# Patient Record
Sex: Female | Born: 1985 | Race: Black or African American | Hispanic: No | Marital: Single | Smoking: Never smoker
Health system: Southern US, Community
[De-identification: ages and names within clinical notes are randomized; demographics above are authoritative.]

## PROBLEM LIST (undated history)

## (undated) ENCOUNTER — Inpatient Hospital Stay (HOSPITAL_COMMUNITY): Payer: Self-pay

## (undated) DIAGNOSIS — M222X9 Patellofemoral disorders, unspecified knee: Secondary | ICD-10-CM

## (undated) DIAGNOSIS — K649 Unspecified hemorrhoids: Secondary | ICD-10-CM

## (undated) DIAGNOSIS — N3000 Acute cystitis without hematuria: Secondary | ICD-10-CM

## (undated) DIAGNOSIS — Z8619 Personal history of other infectious and parasitic diseases: Secondary | ICD-10-CM

## (undated) DIAGNOSIS — B379 Candidiasis, unspecified: Secondary | ICD-10-CM

## (undated) DIAGNOSIS — N760 Acute vaginitis: Secondary | ICD-10-CM

## (undated) DIAGNOSIS — A599 Trichomoniasis, unspecified: Secondary | ICD-10-CM

## (undated) DIAGNOSIS — R87629 Unspecified abnormal cytological findings in specimens from vagina: Secondary | ICD-10-CM

## (undated) DIAGNOSIS — N841 Polyp of cervix uteri: Secondary | ICD-10-CM

## (undated) DIAGNOSIS — B9689 Other specified bacterial agents as the cause of diseases classified elsewhere: Secondary | ICD-10-CM

## (undated) DIAGNOSIS — K219 Gastro-esophageal reflux disease without esophagitis: Secondary | ICD-10-CM

## (undated) HISTORY — DX: Patellofemoral disorders, unspecified knee: M22.2X9

## (undated) HISTORY — DX: Trichomoniasis, unspecified: A59.9

## (undated) HISTORY — DX: Polyp of cervix uteri: N84.1

## (undated) HISTORY — DX: Candidiasis, unspecified: B37.9

## (undated) HISTORY — DX: Unspecified abnormal cytological findings in specimens from vagina: R87.629

## (undated) HISTORY — DX: Other specified bacterial agents as the cause of diseases classified elsewhere: B96.89

## (undated) HISTORY — PX: NO PAST SURGERIES: SHX2092

## (undated) HISTORY — DX: Unspecified hemorrhoids: K64.9

## (undated) HISTORY — DX: Acute vaginitis: N76.0

## (undated) HISTORY — DX: Acute cystitis without hematuria: N30.00

## (undated) HISTORY — DX: Personal history of other infectious and parasitic diseases: Z86.19

---

## 2008-01-12 ENCOUNTER — Emergency Department (HOSPITAL_COMMUNITY): Admission: EM | Admit: 2008-01-12 | Discharge: 2008-01-12 | Payer: Self-pay | Admitting: Emergency Medicine

## 2008-10-12 ENCOUNTER — Other Ambulatory Visit: Admission: RE | Admit: 2008-10-12 | Discharge: 2008-10-12 | Payer: Self-pay | Admitting: Obstetrics & Gynecology

## 2011-03-29 ENCOUNTER — Emergency Department: Payer: Self-pay | Admitting: Emergency Medicine

## 2014-09-22 DIAGNOSIS — R87629 Unspecified abnormal cytological findings in specimens from vagina: Secondary | ICD-10-CM

## 2014-09-22 HISTORY — DX: Unspecified abnormal cytological findings in specimens from vagina: R87.629

## 2014-10-13 ENCOUNTER — Encounter: Payer: Self-pay | Admitting: *Deleted

## 2014-10-13 DIAGNOSIS — N841 Polyp of cervix uteri: Secondary | ICD-10-CM | POA: Insufficient documentation

## 2014-10-13 DIAGNOSIS — M222X9 Patellofemoral disorders, unspecified knee: Secondary | ICD-10-CM | POA: Insufficient documentation

## 2014-10-13 DIAGNOSIS — K649 Unspecified hemorrhoids: Secondary | ICD-10-CM

## 2014-10-16 ENCOUNTER — Ambulatory Visit (INDEPENDENT_AMBULATORY_CARE_PROVIDER_SITE_OTHER): Payer: BC Managed Care – PPO | Admitting: Obstetrics & Gynecology

## 2014-10-16 ENCOUNTER — Encounter: Payer: Self-pay | Admitting: Obstetrics & Gynecology

## 2014-10-16 VITALS — BP 100/70 | Ht 66.0 in | Wt 154.0 lb

## 2014-10-16 DIAGNOSIS — IMO0002 Reserved for concepts with insufficient information to code with codable children: Secondary | ICD-10-CM

## 2014-10-16 DIAGNOSIS — R87810 Cervical high risk human papillomavirus (HPV) DNA test positive: Secondary | ICD-10-CM

## 2014-10-16 DIAGNOSIS — R87610 Atypical squamous cells of undetermined significance on cytologic smear of cervix (ASC-US): Secondary | ICD-10-CM

## 2014-10-16 NOTE — Progress Notes (Signed)
Patient ID: Gail Fields, female   DOB: 12-Feb-1986, 28 y.o.   MRN: [id number] Pap;ASCUS + HR HPV  Colposcopy Procedure Note  Indications: Pap smear 1 months ago showed: ASCUS with POSITIVE high risk HPV. The prior pap showed ASCUS with POSITIVE high risk HPV.  Prior cervical/vaginal disease: normal exam without visible pathology. Prior cervical treatment: no treatment.  Procedure Details  The risks and benefits of the procedure and Verbal informed consent obtained.  Speculum placed in vagina and excellent visualization of cervix achieved, cervix swabbed x 3 with acetic acid solution.  Findings: Cervix: no visible lesions, koilocytic hpv atypia changes Vaginal inspection: normal without visible lesions. Vulvar colposcopy: vulvar colposcopy not performed.  Specimens: none  Complications: none.  Plan: Follow up Pap in 1year

## 2014-11-06 NOTE — L&D Delivery Note (Signed)
Operative Delivery Note At 12:31 PM a viable female was delivered via Vaginal, Vacuum Investment banker, operational(Extractor).  Presentation: vertex; Position: Right,, Occiput,, Anterior; Station: +2.  Indication for operative vaginal delivery:  Prolonged second stage, recurrent persistent variable decelerations  Risks of vacuum assistance were discussed in detail, including but not limited to, bleeding, infection, damage to maternal tissues, fetal cephalohematoma, inability to effect vaginal delivery of the head or shoulder dystocia that cannot be resolved by established maneuvers and need for emergency cesarean section.  Patient gave verbal consent.  Patient was examined and found to be fully dilated with fetal station of +2. The soft vacuum soft cup was positioned over the sagittal suture 3 cm anterior to posterior fontanelle.  Pressure was then increased to 500 mmHg, and the patient was instructed to push.  Pulling was administered along the pelvic curve during four contractions. There were two popoffs.  The infant was then delivered atraumatically; there was a tight nuchal cord that was reduced.  Neonatology present for delivery and attended to baby after delivery.  APGAR: 1, 9  Placenta status: Intact, Spontaneous.   Cord: 3 vessels with the following complications: Tight nuchal cord x 1.    Anesthesia: Epidural  Episiotomy: None Lacerations: 2nd degree Suture Repair: 3.0 vicryl Est. Blood Loss (mL): 300  Mom to postpartum.  Baby to Couplet care / Skin to Skin.  Gail NewcomerANYANWU,[name], MD 08/19/2015, 1:19 PM

## 2014-12-31 ENCOUNTER — Other Ambulatory Visit: Payer: Self-pay | Admitting: Obstetrics & Gynecology

## 2014-12-31 DIAGNOSIS — O3680X0 Pregnancy with inconclusive fetal viability, not applicable or unspecified: Secondary | ICD-10-CM

## 2015-01-05 ENCOUNTER — Ambulatory Visit (INDEPENDENT_AMBULATORY_CARE_PROVIDER_SITE_OTHER): Payer: 59

## 2015-01-05 DIAGNOSIS — O3680X0 Pregnancy with inconclusive fetal viability, not applicable or unspecified: Secondary | ICD-10-CM | POA: Diagnosis not present

## 2015-01-05 NOTE — Progress Notes (Signed)
U/S-single IUP with +FCA noted, FHR-167 bpm, CRL c/w 8+1wks EDD 08/16/2015, cx appears closed, bilateral adnexa appears WNL

## 2015-01-20 ENCOUNTER — Encounter: Payer: Self-pay | Admitting: Women's Health

## 2015-01-20 ENCOUNTER — Ambulatory Visit (INDEPENDENT_AMBULATORY_CARE_PROVIDER_SITE_OTHER): Payer: 59 | Admitting: Women's Health

## 2015-01-20 VITALS — BP 112/56 | HR 60 | Wt 156.0 lb

## 2015-01-20 DIAGNOSIS — O2340 Unspecified infection of urinary tract in pregnancy, unspecified trimester: Secondary | ICD-10-CM | POA: Insufficient documentation

## 2015-01-20 DIAGNOSIS — Z1389 Encounter for screening for other disorder: Secondary | ICD-10-CM

## 2015-01-20 DIAGNOSIS — O26899 Other specified pregnancy related conditions, unspecified trimester: Secondary | ICD-10-CM

## 2015-01-20 DIAGNOSIS — Z3491 Encounter for supervision of normal pregnancy, unspecified, first trimester: Secondary | ICD-10-CM

## 2015-01-20 DIAGNOSIS — R110 Nausea: Secondary | ICD-10-CM

## 2015-01-20 DIAGNOSIS — Z331 Pregnant state, incidental: Secondary | ICD-10-CM

## 2015-01-20 DIAGNOSIS — Z0283 Encounter for blood-alcohol and blood-drug test: Secondary | ICD-10-CM

## 2015-01-20 DIAGNOSIS — R87619 Unspecified abnormal cytological findings in specimens from cervix uteri: Secondary | ICD-10-CM | POA: Insufficient documentation

## 2015-01-20 DIAGNOSIS — R87810 Cervical high risk human papillomavirus (HPV) DNA test positive: Secondary | ICD-10-CM

## 2015-01-20 DIAGNOSIS — Z3490 Encounter for supervision of normal pregnancy, unspecified, unspecified trimester: Secondary | ICD-10-CM | POA: Insufficient documentation

## 2015-01-20 DIAGNOSIS — O2341 Unspecified infection of urinary tract in pregnancy, first trimester: Secondary | ICD-10-CM

## 2015-01-20 DIAGNOSIS — R87610 Atypical squamous cells of undetermined significance on cytologic smear of cervix (ASC-US): Secondary | ICD-10-CM

## 2015-01-20 DIAGNOSIS — Z369 Encounter for antenatal screening, unspecified: Secondary | ICD-10-CM

## 2015-01-20 LAB — OB RESULTS CONSOLE HIV ANTIBODY (ROUTINE TESTING): HIV: NONREACTIVE

## 2015-01-20 LAB — OB RESULTS CONSOLE ABO/RH: RH Type: POSITIVE

## 2015-01-20 LAB — OB RESULTS CONSOLE RUBELLA ANTIBODY, IGM: Rubella: IMMUNE

## 2015-01-20 MED ORDER — CEPHALEXIN 500 MG PO CAPS: 500.0000 mg | ORAL_CAPSULE | Freq: Four times a day (QID) | ORAL | Status: DC

## 2015-01-20 MED ORDER — PRENATAL 27-0.8 MG PO TABS: 1.0000 | ORAL_TABLET | Freq: Every day | ORAL | Status: DC

## 2015-01-20 NOTE — Progress Notes (Addendum)
  Subjective:  Jocelyn LamerLeslie Dell is a 29 y.o. G2P0010 African American female at 1370w2d by 8.1wk u/s, being seen today for her first obstetrical visit.  Her obstetrical history is significant for tab x 1.  Pregnancy history fully reviewed.  Patient reports nausea- requests meds. Denies vb, cramping, uti s/s, abnormal/malodorous vag d/c, or vulvovaginal itching/irritation.  BP 112/56 mmHg  Pulse 60  Wt 156 lb (70.761 kg)  LMP  (LMP Unknown)  HISTORY: OB History  Gravida Para Term Preterm AB SAB TAB Ectopic Multiple Living  2    1  1        # Outcome Date GA Lbr Len/2nd Weight Sex Delivery Anes PTL Lv  2 Current           1 TAB 2011             Past Medical History  Diagnosis Date  . Hemorrhoid   . BV (bacterial vaginosis)   . Acute cystitis   . Trichimoniasis   . 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 Patellofemoral stress syndrome   . Hx of chlamydia infection   . Yeast infection   . Vaginal Pap smear, abnormal 09/22/14    ASCUS- +HPV   Past Surgical History  Procedure Laterality Date  . No past surgeries     Family History  Problem Relation Age of Onset  . Cancer Maternal Grandmother     breast  . Seizures Paternal Grandmother     Exam   System:     General: Well developed & nourished, no acute distress   Skin: Warm & dry, normal coloration and turgor, no rashes   Neurologic: Alert & oriented, normal mood   Cardiovascular: Regular rate & rhythm   Respiratory: Effort & rate normal, LCTAB, acyanotic   Abdomen: Soft, non tender   Extremities: normal strength, tone  Thin prep pap smear 09/22/14: [facility], normal colpo w/ LHE Dec 2015, needs pap after 09/23/15  FHR: 161 via doppler   Assessment:   Pregnancy: G2P0010 Patient Active Problem List   Diagnosis Date Noted  . Supervision of normal pregnancy 01/20/2015    Priority: High  . Hemorrhoids 10/13/2014  . Polyp at cervical os 10/13/2014  . Patellofemoral stress syndrome 10/13/2014     7170w2d G2P0010 New OB visit Nausea of pregnancy UTI 1st trimester  Plan:  Initial labs drawn Continue prenatal vitamins, refill sent in per request Problem list reviewed and updated Reviewed n/v relief measures and warning s/s to report 2 Diclegis samples given- will let us know if Mcaid goes through Reviewed recommended weight gain based on pre-gravid BMI Encouraged well-balanced diet Genetic Screening discussed Integrated Screen: requested Cystic fibrosis screening discussed requested Ultrasound discussed; fetal survey: requested Follow up in 2 weeks for 1st IT/NT and visit [facility] completed Rx keflex qid x 7d for UTI  Marge DuncansBooker, Shiana Rappleye Randall CNM, [facility]NP-BC 01/20/2015 4:21 PM

## 2015-01-20 NOTE — Patient Instructions (Signed)

## 2015-01-21 ENCOUNTER — Other Ambulatory Visit: Payer: Self-pay | Admitting: *Deleted

## 2015-01-21 ENCOUNTER — Telehealth: Payer: Self-pay | Admitting: *Deleted

## 2015-01-21 MED ORDER — PRENATAL PLUS 27-1 MG PO TABS: 1.0000 | ORAL_TABLET | Freq: Every day | ORAL | Status: DC

## 2015-01-21 NOTE — Telephone Encounter (Signed)
Pt stated that she needed a new prenatal vit called into her pharmacy. I advised the pt that it had already been taken care of. The pt states that she has had some issues with constipation. The pt was advised to push her fluids and try miralax, prunes and prune juice. The pt stated that she has tried all of that and still  Will go 4-5 between BMs. I spoke with Drenda FreezeFran and she advised to try eating a couple fiber one bars a day. I advised the pt of this and she stated that she would try that but she didn't really like those bars. I advised the pt that she just needed to increase her fiber, so try to eat fiber rich foods. Pt verbalized understanding.

## 2015-01-22 LAB — URINE CULTURE

## 2015-01-25 ENCOUNTER — Telehealth: Payer: Self-pay | Admitting: Women's Health

## 2015-01-25 DIAGNOSIS — Z3491 Encounter for supervision of normal pregnancy, unspecified, first trimester: Secondary | ICD-10-CM

## 2015-01-25 LAB — PMP SCREEN PROFILE (10S), URINE
Amphetamine Screen, Ur: NEGATIVE ng/mL
Barbiturate Screen, Ur: NEGATIVE ng/mL
Benzodiazepine Screen, Urine: NEGATIVE ng/mL
CANNABINOIDS UR QL SCN: NEGATIVE ng/mL
COCAINE(METAB.) SCREEN, URINE: NEGATIVE ng/mL
CREATININE(CRT), U: 134.5 mg/dL (ref 20.0–300.0)
Methadone Scn, Ur: NEGATIVE ng/mL
OPIATE SCRN UR: NEGATIVE ng/mL
Oxycodone+Oxymorphone Ur Ql Scn: NEGATIVE ng/mL
PCP SCRN UR: NEGATIVE ng/mL
PROPOXYPHENE SCREEN: NEGATIVE ng/mL
Ph of Urine: 6.4 (ref 4.5–8.9)

## 2015-01-25 LAB — MICROSCOPIC EXAMINATION: CASTS: NONE SEEN /LPF

## 2015-01-25 LAB — GC/CHLAMYDIA PROBE AMP
Chlamydia trachomatis, NAA: NEGATIVE
Neisseria gonorrhoeae by PCR: NEGATIVE

## 2015-01-25 LAB — HIV ANTIBODY (ROUTINE TESTING W REFLEX): HIV Screen 4th Generation wRfx: NONREACTIVE

## 2015-01-25 LAB — CYSTIC FIBROSIS MUTATION 97: GENE DIS ANAL CARRIER INTERP BLD/T-IMP: NOT DETECTED

## 2015-01-25 LAB — CBC
HCT: 36.1 % (ref 34.0–46.6)
Hemoglobin: 12.4 g/dL (ref 11.1–15.9)
MCH: 30.7 pg (ref 26.6–33.0)
MCHC: 34.3 g/dL (ref 31.5–35.7)
MCV: 89 fL (ref 79–97)
PLATELETS: 249 10*3/uL (ref 150–379)
RBC: 4.04 x10E6/uL (ref 3.77–5.28)
RDW: 13.9 % (ref 12.3–15.4)
WBC: 10.6 10*3/uL (ref 3.4–10.8)

## 2015-01-25 LAB — URINALYSIS, ROUTINE W REFLEX MICROSCOPIC
BILIRUBIN UA: NEGATIVE
GLUCOSE, UA: NEGATIVE
Ketones, UA: NEGATIVE
Nitrite, UA: NEGATIVE
PROTEIN UA: NEGATIVE
RBC UA: NEGATIVE
SPEC GRAV UA: 1.022 (ref 1.005–1.030)
UUROB: 1 mg/dL (ref 0.2–1.0)
pH, UA: 6.5 (ref 5.0–7.5)

## 2015-01-25 LAB — ABO/RH: Rh Factor: POSITIVE

## 2015-01-25 LAB — HEPATITIS B SURFACE ANTIGEN: HEP B S AG: NEGATIVE

## 2015-01-25 LAB — RUBELLA SCREEN: RUBELLA: 2.21 {index} (ref >0.99)

## 2015-01-25 LAB — SICKLE CELL SCREEN: Sickle Cell Screen: NEGATIVE

## 2015-01-25 LAB — ANTIBODY SCREEN: ANTIBODY SCREEN: NEGATIVE

## 2015-01-25 LAB — VARICELLA ZOSTER ANTIBODY, IGG: Varicella zoster IgG: 268 index (ref >165)

## 2015-01-25 LAB — RPR: RPR Ser Ql: NONREACTIVE

## 2015-02-02 ENCOUNTER — Ambulatory Visit (INDEPENDENT_AMBULATORY_CARE_PROVIDER_SITE_OTHER): Payer: 59 | Admitting: Women's Health

## 2015-02-02 VITALS — BP 118/60 | HR 71 | Temp 98.2°F | Wt 160.0 lb

## 2015-02-02 DIAGNOSIS — K5900 Constipation, unspecified: Secondary | ICD-10-CM | POA: Diagnosis not present

## 2015-02-02 DIAGNOSIS — Z3A12 12 weeks gestation of pregnancy: Secondary | ICD-10-CM

## 2015-02-02 DIAGNOSIS — Z1389 Encounter for screening for other disorder: Secondary | ICD-10-CM

## 2015-02-02 DIAGNOSIS — J302 Other seasonal allergic rhinitis: Secondary | ICD-10-CM

## 2015-02-02 DIAGNOSIS — Z331 Pregnant state, incidental: Secondary | ICD-10-CM

## 2015-02-02 LAB — POCT URINALYSIS DIPSTICK
Blood, UA: NEGATIVE
Glucose, UA: NEGATIVE
Ketones, UA: NEGATIVE
Leukocytes, UA: NEGATIVE
Nitrite, UA: NEGATIVE
Protein, UA: NEGATIVE

## 2015-02-02 NOTE — Progress Notes (Addendum)
   [facility] Visit  Patient name: Gail Fields MRN [id number]  Date of birth: 05-18-1986  CC & HPI:  Gail Fields is a 29 y.o. G2P0010 African American female at 3928w1d presenting today for work-in for report of bad sore throat that began couple of days ago. Hurts to swallow/eat, has tried chloraseptic spray and gargling w/ warm salt water w/o relief. Some sneezing. Denies itchy/watery eyes, fever/chills, cough, earache, runny nose, congestion. Some constipation.   Pertinent History Reviewed:  Medical & Surgical Hx:   Past Medical History  Diagnosis Date  . Hemorrhoid   . BV (bacterial vaginosis)   . Acute cystitis   . Trichimoniasis   . 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 Patellofemoral stress syndrome   . Hx of chlamydia infection   . Yeast infection   . Vaginal Pap smear, abnormal 09/22/14    ASCUS- +HPV   Past Surgical History  Procedure Laterality Date  . No past surgeries     Medications: Reviewed & Updated - see associated section Social History: Reviewed -  reports that she has never smoked. She has never used smokeless tobacco.  Objective Findings:  Vitals: BP 118/60 mmHg  Pulse 71  Wt 160 lb (72.576 kg)  LMP  (LMP Unknown)  Physical Examination: General appearance - alert, well appearing, and in no distress Mouth - moist mucous membranes, oropharynx slightly erythematous, no exudate or lesions Lymphatics - no palpable lymphadenopathy  No results found for this or any previous visit (from the past 24 hour(s)).   Assessment & Plan:  A:   4428w1d SIUP  Seasonal allergies  Constipation P:  Claritin or Zyrtec as needed   Call if develops any other sx  Discussed & gave printed info on constipation  F/U next week as scheduled for nt/it and visit   Marge DuncansBooker, Kimberly Randall CNM, [facility]NP-BC 02/02/2015 4:04 PM

## 2015-02-02 NOTE — Telephone Encounter (Signed)
Pt states that she has had a sore throat since yesterday and has just gotten worse. Pt states that she has tried gargling with warm salt water and using a spray but no relief. Pt states that it hurts to swallow or eat.

## 2015-02-02 NOTE — Telephone Encounter (Signed)
I spoke with Selena BattenKim and she advised that the pt should be seen. I called and left a message on her voice mail to call our office back.

## 2015-02-02 NOTE — Patient Instructions (Signed)
Claritin or Zyrtec  Constipation  Drink plenty of fluid, preferably water, throughout the day  Eat foods high in fiber such as fruits, vegetables, and grains  Exercise, such as walking, is a good way to keep your bowels regular  Drink warm fluids, especially warm prune juice, or decaf coffee  Eat a 1/2 cup of real oatmeal (not instant), 1/2 cup applesauce, and 1/2-1 cup warm prune juice every day  If needed, you may take Colace (docusate sodium) stool softener once or twice a day to help keep the stool soft. If you are pregnant, wait until you are out of your first trimester (12-14 weeks of pregnancy)  If you still are having problems with constipation, you may take Miralax once daily as needed to help keep your bowels regular.  If you are pregnant, wait until you are out of your first trimester (12-14 weeks of pregnancy)

## 2015-02-03 ENCOUNTER — Telehealth: Payer: Self-pay | Admitting: Women's Health

## 2015-02-03 NOTE — Telephone Encounter (Signed)
Pt informed of Joellyn HaffKim Booker, CNM recommendation.

## 2015-02-03 NOTE — Telephone Encounter (Signed)
Pt states she saw Joellyn HaffKim Booker, CNM yesterday was told to take Claritin or Zyrtec OTC for allergies. Pt states symptoms worse, sneezing more, coughing, drainage, scratchy throat. Is there anything else she can take?

## 2015-02-09 ENCOUNTER — Other Ambulatory Visit: Payer: Self-pay | Admitting: *Deleted

## 2015-02-09 DIAGNOSIS — Z3682 Encounter for antenatal screening for nuchal translucency: Secondary | ICD-10-CM

## 2015-02-10 ENCOUNTER — Ambulatory Visit (INDEPENDENT_AMBULATORY_CARE_PROVIDER_SITE_OTHER): Payer: 59

## 2015-02-10 ENCOUNTER — Encounter: Payer: Self-pay | Admitting: Obstetrics & Gynecology

## 2015-02-10 ENCOUNTER — Ambulatory Visit (INDEPENDENT_AMBULATORY_CARE_PROVIDER_SITE_OTHER): Payer: 59 | Admitting: Obstetrics & Gynecology

## 2015-02-10 VITALS — BP 110/60 | HR 88 | Wt 164.0 lb

## 2015-02-10 DIAGNOSIS — Z369 Encounter for antenatal screening, unspecified: Secondary | ICD-10-CM

## 2015-02-10 DIAGNOSIS — Z3682 Encounter for antenatal screening for nuchal translucency: Secondary | ICD-10-CM

## 2015-02-10 DIAGNOSIS — Z3491 Encounter for supervision of normal pregnancy, unspecified, first trimester: Secondary | ICD-10-CM

## 2015-02-10 DIAGNOSIS — Z36 Encounter for antenatal screening of mother: Secondary | ICD-10-CM | POA: Diagnosis not present

## 2015-02-10 NOTE — Addendum Note (Signed)
Addended by: Federico FlakeNES, PEGGY A on: 02/10/2015 05:06 PM   Modules accepted: Medications

## 2015-02-10 NOTE — Progress Notes (Signed)
G2P0010 4548w2d Estimated Date of Delivery: 08/16/15  There were no vitals taken for this visit.   BP weight and urine results all reviewed and noted.  Please refer to the obstetrical flow sheet for the fundal height and fetal heart rate documentation:  Patient reports good fetal movement, denies any bleeding and no rupture of membranes symptoms or regular contractions. Patient is without complaints. All questions were answered.  Plan:  Continued routine obstetrical care,   Follow up in 4 weeks for OB appointment, 2nd IT

## 2015-02-10 NOTE — Progress Notes (Signed)
US [redacted]W[redacted]D crl c/w dates,normal ovs, pos fht 154bpm,nasal bone present, nt 1.686mm

## 2015-02-11 ENCOUNTER — Other Ambulatory Visit: Payer: 59

## 2015-02-11 ENCOUNTER — Encounter: Payer: 59 | Admitting: Advanced Practice Midwife

## 2015-02-12 LAB — MATERNAL SCREEN, INTEGRATED #1
Crown Rump Length: 67.9 mm
GEST. AGE ON COLLECTION DATE: 12.9 wk
Maternal Age at EDD: 30.3 years
Nuchal Translucency (NT): 1.6 mm
Number of Fetuses: 1
PAPP-A VALUE: 3689.8 ng/mL
WEIGHT: 163 [lb_av]

## 2015-03-10 ENCOUNTER — Encounter: Payer: Self-pay | Admitting: Obstetrics & Gynecology

## 2015-03-10 ENCOUNTER — Ambulatory Visit (INDEPENDENT_AMBULATORY_CARE_PROVIDER_SITE_OTHER): Payer: 59 | Admitting: Obstetrics & Gynecology

## 2015-03-10 VITALS — BP 120/60 | HR 76 | Wt 164.0 lb

## 2015-03-10 DIAGNOSIS — Z369 Encounter for antenatal screening, unspecified: Secondary | ICD-10-CM

## 2015-03-10 DIAGNOSIS — Z3492 Encounter for supervision of normal pregnancy, unspecified, second trimester: Secondary | ICD-10-CM

## 2015-03-10 DIAGNOSIS — Z331 Pregnant state, incidental: Secondary | ICD-10-CM

## 2015-03-10 DIAGNOSIS — Z1389 Encounter for screening for other disorder: Secondary | ICD-10-CM

## 2015-03-10 LAB — POCT URINALYSIS DIPSTICK
GLUCOSE UA: NEGATIVE
Ketones, UA: NEGATIVE
LEUKOCYTES UA: NEGATIVE
NITRITE UA: NEGATIVE
PROTEIN UA: NEGATIVE
RBC UA: NEGATIVE

## 2015-03-10 NOTE — Addendum Note (Signed)
Addended by: Colen DarlingYOUNG, Latandra Loureiro S on: 03/10/2015 04:31 PM   Modules accepted: Orders

## 2015-03-12 LAB — MATERNAL SCREEN, INTEGRATED #2
AFP MARKER: 44.5 ng/mL
AFP MoM: 1.22
Crown Rump Length: 67.9 mm
DIA MOM: 1.95
DIA Value: 329.9 pg/mL
ESTRIOL UNCONJUGATED: 1.07 ng/mL
GEST. AGE ON COLLECTION DATE: 12.9 wk
GESTATIONAL AGE: 16.9 wk
HCG MOM: 1.27
HCG VALUE: 35.1 [IU]/mL
Maternal Age at EDD: 30.3 years
NUCHAL TRANSLUCENCY (NT): 1.6 mm
NUCHAL TRANSLUCENCY MOM: 1.06
Number of Fetuses: 1
PAPP-A MoM: 3.54
PAPP-A Value: 3689.8 ng/mL
Test Results:: NEGATIVE
Weight: 163 [lb_av]
Weight: 164 [lb_av]
uE3 MoM: 1.08

## 2015-04-07 ENCOUNTER — Other Ambulatory Visit: Payer: 59

## 2015-04-07 ENCOUNTER — Encounter: Payer: 59 | Admitting: Women's Health

## 2015-04-14 ENCOUNTER — Other Ambulatory Visit: Payer: Self-pay | Admitting: Obstetrics & Gynecology

## 2015-04-14 ENCOUNTER — Ambulatory Visit (INDEPENDENT_AMBULATORY_CARE_PROVIDER_SITE_OTHER): Payer: 59

## 2015-04-14 ENCOUNTER — Ambulatory Visit (INDEPENDENT_AMBULATORY_CARE_PROVIDER_SITE_OTHER): Payer: 59 | Admitting: Advanced Practice Midwife

## 2015-04-14 VITALS — BP 108/56 | HR 82 | Wt 167.5 lb

## 2015-04-14 DIAGNOSIS — Z1389 Encounter for screening for other disorder: Secondary | ICD-10-CM

## 2015-04-14 DIAGNOSIS — Z331 Pregnant state, incidental: Secondary | ICD-10-CM

## 2015-04-14 DIAGNOSIS — Z3492 Encounter for supervision of normal pregnancy, unspecified, second trimester: Secondary | ICD-10-CM

## 2015-04-14 DIAGNOSIS — N898 Other specified noninflammatory disorders of vagina: Secondary | ICD-10-CM

## 2015-04-14 LAB — POCT URINALYSIS DIPSTICK
Blood, UA: NEGATIVE
Glucose, UA: NEGATIVE
Ketones, UA: NEGATIVE
Leukocytes, UA: NEGATIVE
NITRITE UA: NEGATIVE
Protein, UA: NEGATIVE

## 2015-04-14 MED ORDER — PRAMOXINE HCL 1 % RE FOAM: RECTAL | Status: DC

## 2015-04-14 NOTE — Progress Notes (Signed)
G2P0010 3647w2d Estimated Date of Delivery: 08/16/15  Blood pressure 108/56, pulse 82, weight 167 lb 8 oz (75.978 kg).   BP weight and urine results all reviewed and noted.  Please refer to the obstetrical flow sheet for the fundal height and fetal heart rate documentation: Anatomy scan today:   [place] 22+2wks measurements c/w dates,ant pl gr 0,cephalic,sdp of fluid 6.4cm,normal rt ov,unable to see lt ov, fht 143,efw 549g,anatomy complete w/no obvious abn          Patient reports good fetal movement, denies any bleeding and no rupture of membranes symptoms or regular contractions. Patient C/O hemorrhoids and increased DC.  No odor or irritation.  SSE:  Frothy white dc.  No odor. Wet prep:  Neg clue, trich, yeast.  Many WBC All questions were answered.  Plan:  Continued routine obstetrical care, proctocream BID.  GC/CHL  Follow up in 3 weeks for OB appointment,

## 2015-04-14 NOTE — Progress Notes (Signed)
[place] 22+2wks measurements c/w dates,ant pl gr 0,cephalic,sdp of fluid 6.4cm,normal rt ov,unable to see lt ov, fht 143,efw 549g,anatomy complete w/no obvious abn

## 2015-04-16 LAB — GC/CHLAMYDIA PROBE AMP
CHLAMYDIA, DNA PROBE: NEGATIVE
Neisseria gonorrhoeae by PCR: NEGATIVE

## 2015-05-11 ENCOUNTER — Encounter: Payer: 59 | Admitting: Obstetrics and Gynecology

## 2015-05-19 ENCOUNTER — Encounter: Payer: Self-pay | Admitting: Obstetrics and Gynecology

## 2015-05-19 ENCOUNTER — Ambulatory Visit (INDEPENDENT_AMBULATORY_CARE_PROVIDER_SITE_OTHER): Payer: 59 | Admitting: Obstetrics and Gynecology

## 2015-05-19 ENCOUNTER — Encounter: Payer: 59 | Admitting: Obstetrics and Gynecology

## 2015-05-19 VITALS — BP 90/60 | HR 96 | Wt 175.0 lb

## 2015-05-19 DIAGNOSIS — Z3202 Encounter for pregnancy test, result negative: Secondary | ICD-10-CM

## 2015-05-19 DIAGNOSIS — Z1389 Encounter for screening for other disorder: Secondary | ICD-10-CM

## 2015-05-19 DIAGNOSIS — Z331 Pregnant state, incidental: Secondary | ICD-10-CM

## 2015-05-19 LAB — POCT URINALYSIS DIPSTICK
Blood, UA: NEGATIVE
GLUCOSE UA: NEGATIVE
KETONES UA: NEGATIVE
Leukocytes, UA: NEGATIVE
Nitrite, UA: NEGATIVE
PROTEIN UA: NEGATIVE

## 2015-05-19 NOTE — Progress Notes (Signed)
Patient ID: Gail Fields, female   DOB: 18-Jul-1986, 29 y.o.   MRN: [id number]  G2P0010 5819w2d Estimated Date of Delivery: 08/16/15  Blood pressure 90/60, pulse 96, weight 175 lb (79.379 kg).   refer to the ob flow sheet for FH and FHR, also BP, Wt, Urine results:notable for negative  Patient reports  + good fetal movement, denies any bleeding and no rupture of membranes symptoms or regular contractions. Patient complaints: None. She notes some intermittent bilateral ankle swelling and left knee pain. She states her left knee has "given out" when she has stepped out of her car twice so far, which has not ever happened until her pregnancy.   FHR: 137 bpm FH: 30 cm  Questions were answered. Childbirth classes encouraged.  Assessment: Pregnancy 6819w2d, LROB   Joint pain nonconcerning, .  Plan:  Continued routine obstetrical care,   Tylenol as needed for joint pain.  Knee compression sleeve advised prn  F/u in ~2 weeks for GTT testing, 28 wk labs  F/u in 4 weeks in-office for pnx care,     This chart was SCRIBED for Christin BachJohn Zionah Criswell, MD by Ronney LionSuzanne Le, ED Scribe. This patient was seen in room 1, and the patient's care was started at 12:05 PM.  I personally performed the services described in this documentation, which was SCRIBED in my presence. The recorded information has been reviewed and considered accurate. It has been edited as necessary during review. Tilda BurrowFERGUSON,Genna Casimir V, MD

## 2015-05-19 NOTE — Progress Notes (Signed)
Pt denies any problems or concerns at this time.  

## 2015-06-10 ENCOUNTER — Other Ambulatory Visit: Payer: 59

## 2015-06-10 ENCOUNTER — Encounter: Payer: 59 | Admitting: Advanced Practice Midwife

## 2015-06-14 ENCOUNTER — Other Ambulatory Visit: Payer: 59

## 2015-06-23 ENCOUNTER — Ambulatory Visit (INDEPENDENT_AMBULATORY_CARE_PROVIDER_SITE_OTHER): Payer: Medicaid Other | Admitting: Advanced Practice Midwife

## 2015-06-23 ENCOUNTER — Other Ambulatory Visit: Payer: Medicaid Other

## 2015-06-23 ENCOUNTER — Encounter: Payer: Self-pay | Admitting: Advanced Practice Midwife

## 2015-06-23 VITALS — BP 110/60 | HR 80 | Wt 181.4 lb

## 2015-06-23 DIAGNOSIS — Z331 Pregnant state, incidental: Secondary | ICD-10-CM

## 2015-06-23 DIAGNOSIS — Z3493 Encounter for supervision of normal pregnancy, unspecified, third trimester: Secondary | ICD-10-CM

## 2015-06-23 DIAGNOSIS — Z1389 Encounter for screening for other disorder: Secondary | ICD-10-CM

## 2015-06-23 DIAGNOSIS — N898 Other specified noninflammatory disorders of vagina: Secondary | ICD-10-CM

## 2015-06-23 LAB — OB RESULTS CONSOLE GC/CHLAMYDIA
Chlamydia: NEGATIVE
Gonorrhea: NEGATIVE

## 2015-06-23 LAB — POCT URINALYSIS DIPSTICK
Glucose, UA: NEGATIVE
KETONES UA: NEGATIVE
Leukocytes, UA: NEGATIVE
Nitrite, UA: NEGATIVE
PROTEIN UA: NEGATIVE
RBC UA: NEGATIVE

## 2015-06-23 MED ORDER — DIBUCAINE 1 % EX OINT: TOPICAL_OINTMENT | Freq: Three times a day (TID) | CUTANEOUS | Status: DC | PRN

## 2015-06-23 MED ORDER — HYDROCORTISONE 2.5 % RE CREA: 1.0000 "application " | TOPICAL_CREAM | RECTAL | Status: DC | PRN

## 2015-06-23 NOTE — Progress Notes (Signed)
G2P0010 [redacted]w[redacted]d Estimated Date of Delivery: 08/16/15  Blood pressure 110/60, pulse 80, weight 181 lb 6.4 oz (82.283 kg).   BP weight and urine results all reviewed and noted.  Please refer to the obstetrical flow sheet for the fundal height and fetal heart rate documentation:  Patient reports good fetal movement, denies any bleeding and no rupture of membranes symptoms or regular contractions. Patient c/o occ vag itch this week.  Also wants hemorrhoid cream with "caine" of some sort.  SSE:  Small frothy white dc, no odor.  No clue, TNTC WBC, no trich.  Vagina looks normal.  1cm non thrombosed hemorrhoid. All questions were answered.  Plan:  Continued routine obstetrical care, PN2 today. Check GC/CHL/Trich on urine.  Rx dubucaine ointment. May use hydrocortisone on ext vagina and hemorrhoid prn itch  Follow up in 2 weeks for OB appointment,

## 2015-06-24 LAB — GLUCOSE TOLERANCE, 2 HOURS W/ 1HR
Glucose, 1 hour: 119 mg/dL (ref 65–179)
Glucose, 2 hour: 92 mg/dL (ref 65–152)
Glucose, Fasting: 84 mg/dL (ref 65–91)

## 2015-06-24 LAB — CBC
Hematocrit: 35.9 % (ref 34.0–46.6)
Hemoglobin: 12.1 g/dL (ref 11.1–15.9)
MCH: 30.6 pg (ref 26.6–33.0)
MCHC: 33.7 g/dL (ref 31.5–35.7)
MCV: 91 fL (ref 79–97)
PLATELETS: 168 10*3/uL (ref 150–379)
RBC: 3.96 x10E6/uL (ref 3.77–5.28)
RDW: 12.9 % (ref 12.3–15.4)
WBC: 10.1 10*3/uL (ref 3.4–10.8)

## 2015-06-24 LAB — ANTIBODY SCREEN: ANTIBODY SCREEN: NEGATIVE

## 2015-06-24 LAB — HSV 2 ANTIBODY, IGG: HSV 2 Glycoprotein G Ab, IgG: <0.91 index (ref 0.00–0.90)

## 2015-06-24 LAB — RPR: RPR: NONREACTIVE

## 2015-06-24 LAB — HIV ANTIBODY (ROUTINE TESTING W REFLEX): HIV Screen 4th Generation wRfx: NONREACTIVE

## 2015-06-25 LAB — GC/CHLAMYDIA PROBE AMP
Chlamydia trachomatis, NAA: NEGATIVE
Neisseria gonorrhoeae by PCR: NEGATIVE

## 2015-06-25 LAB — TRICHOMONAS VAGINALIS, PROBE AMP: TRICH VAG BY NAA: NEGATIVE

## 2015-07-09 ENCOUNTER — Encounter: Payer: Medicaid Other | Admitting: Obstetrics and Gynecology

## 2015-07-09 ENCOUNTER — Ambulatory Visit (INDEPENDENT_AMBULATORY_CARE_PROVIDER_SITE_OTHER): Payer: Medicaid Other | Admitting: Obstetrics and Gynecology

## 2015-07-09 ENCOUNTER — Encounter: Payer: Self-pay | Admitting: Obstetrics and Gynecology

## 2015-07-09 VITALS — BP 110/66 | HR 92 | Wt 184.0 lb

## 2015-07-09 DIAGNOSIS — Z3493 Encounter for supervision of normal pregnancy, unspecified, third trimester: Secondary | ICD-10-CM

## 2015-07-09 DIAGNOSIS — Z331 Pregnant state, incidental: Secondary | ICD-10-CM

## 2015-07-09 DIAGNOSIS — Z1389 Encounter for screening for other disorder: Secondary | ICD-10-CM

## 2015-07-09 LAB — POCT URINALYSIS DIPSTICK
Blood, UA: NEGATIVE
GLUCOSE UA: NEGATIVE
Ketones, UA: NEGATIVE
LEUKOCYTES UA: NEGATIVE
NITRITE UA: NEGATIVE
Protein, UA: NEGATIVE

## 2015-07-09 NOTE — Progress Notes (Signed)
Patient ID: Gail Fields, female   DOB: [birth year], 29 y.o.   MRN: [id number]  G2P0010 [redacted]w[redacted]d Estimated Date of Delivery: 08/16/15  Blood pressure 110/66, pulse 92, weight 184 lb (83.462 kg).   refer to the ob flow sheet for FH and FHR, also BP, Wt, Urine results:notable for negative  Patient reports  + good fetal movement, denies any bleeding and no rupture of membranes symptoms or regular contractions. Patient complaints: None.  FHR: 136 bpm FH: 35 cm   Questions were answered. Assessment: Pregnancy [redacted]w[redacted]d, LROB   Weekend childbirth classes strongly encouraged. Plan:  Continued routine obstetrical care,   F/u in 2 weeks for pnx care  ;Classes encouraged    This chart was SCRIBED for Christin Bach, MD by Ronney Lion, ED Scribe. This patient was seen in room 2, and the patient's care was started at 10:15 AM.  I personally performed the services described in this documentation, which was SCRIBED in my presence. The recorded information has been reviewed and considered accurate. It has been edited as necessary during review. Tilda Burrow, MD

## 2015-07-09 NOTE — Progress Notes (Signed)
Pt denies any problems or concerns at this time.  

## 2015-07-21 ENCOUNTER — Encounter: Payer: Self-pay | Admitting: Obstetrics & Gynecology

## 2015-07-21 ENCOUNTER — Encounter: Payer: Medicaid Other | Admitting: Women's Health

## 2015-07-21 ENCOUNTER — Ambulatory Visit (INDEPENDENT_AMBULATORY_CARE_PROVIDER_SITE_OTHER): Payer: Medicaid Other | Admitting: Obstetrics & Gynecology

## 2015-07-21 VITALS — BP 118/70 | HR 72 | Wt 192.0 lb

## 2015-07-21 DIAGNOSIS — Z3493 Encounter for supervision of normal pregnancy, unspecified, third trimester: Secondary | ICD-10-CM

## 2015-07-21 DIAGNOSIS — Z331 Pregnant state, incidental: Secondary | ICD-10-CM

## 2015-07-21 DIAGNOSIS — Z1389 Encounter for screening for other disorder: Secondary | ICD-10-CM

## 2015-07-21 LAB — POCT URINALYSIS DIPSTICK
Blood, UA: NEGATIVE
GLUCOSE UA: NEGATIVE
KETONES UA: NEGATIVE
LEUKOCYTES UA: NEGATIVE
PROTEIN UA: NEGATIVE

## 2015-07-21 NOTE — Progress Notes (Signed)
G2P0010 [redacted]w[redacted]d Estimated Date of Delivery: 08/16/15  Blood pressure 118/70, pulse 72, weight 192 lb (87.091 kg).   BP weight and urine results all reviewed and noted.  Please refer to the obstetrical flow sheet for the fundal height and fetal heart rate documentation:  Patient reports good fetal movement, denies any bleeding and no rupture of membranes symptoms or regular contractions. Patient is without complaints. All questions were answered.  Orders Placed This Encounter  Procedures  . POCT urinalysis dipstick    Plan:  Continued routine obstetrical care,   Return in about 1 week (around 07/28/2015) for LROB.

## 2015-07-30 ENCOUNTER — Ambulatory Visit (INDEPENDENT_AMBULATORY_CARE_PROVIDER_SITE_OTHER): Payer: Medicaid Other | Admitting: Women's Health

## 2015-07-30 ENCOUNTER — Encounter: Payer: Self-pay | Admitting: Women's Health

## 2015-07-30 ENCOUNTER — Encounter: Payer: Medicaid Other | Admitting: Women's Health

## 2015-07-30 VITALS — BP 100/70 | HR 72 | Wt 190.4 lb

## 2015-07-30 DIAGNOSIS — Z1159 Encounter for screening for other viral diseases: Secondary | ICD-10-CM

## 2015-07-30 DIAGNOSIS — Z1389 Encounter for screening for other disorder: Secondary | ICD-10-CM

## 2015-07-30 DIAGNOSIS — Z331 Pregnant state, incidental: Secondary | ICD-10-CM

## 2015-07-30 DIAGNOSIS — Z3493 Encounter for supervision of normal pregnancy, unspecified, third trimester: Secondary | ICD-10-CM

## 2015-07-30 DIAGNOSIS — Z3685 Encounter for antenatal screening for Streptococcus B: Secondary | ICD-10-CM

## 2015-07-30 DIAGNOSIS — Z118 Encounter for screening for other infectious and parasitic diseases: Secondary | ICD-10-CM

## 2015-07-30 LAB — POCT URINALYSIS DIPSTICK
Blood, UA: NEGATIVE
Glucose, UA: NEGATIVE
LEUKOCYTES UA: NEGATIVE
NITRITE UA: NEGATIVE
PROTEIN UA: NEGATIVE

## 2015-07-30 LAB — OB RESULTS CONSOLE GBS: GBS: NEGATIVE

## 2015-07-30 NOTE — Patient Instructions (Addendum)
Call the office [phone]) or go to [facility] if:  You begin to have strong, frequent contractions  Your water breaks.  Sometimes it is a big gush of fluid, sometimes it is just a trickle that keeps getting your panties wet or running down your legs  You have vaginal bleeding.  It is normal to have a small amount of spotting if your cervix was checked.   You don't feel your baby moving like normal.  If you don't, get you something to eat and drink and lay down and focus on feeling your baby move.  You should feel at least 10 movements in 2 hours.  If you don't, you should call the office or go to [facility].    [place] Pediatricians/Family Doctors:  [facility] [phone]            [facility] [phone]                 [facility] [phone] (usually not accepting new patients unless you have family there already, you are always welcome to call and ask)            [facility] ([address]) [phone]   AREA PEDIATRIC/FAMILY PRACTICE PHYSICIANS  [facility] [address] [place], [place] [address] Phone - [phone]   Fax - [phone]  JACK AMOS [address] [place], [place]  [address] Phone - [phone]   Fax - [phone]  [facility] [address] [place], [place]  [address] Phone - [phone]   Fax - [phone]  [facility] [address] [place], [place]  [address] Phone - [phone]   Fax - [phone]  [facility] [address] [place], [place]  [address] Phone - [phone]   Fax - [phone]  [facility] [address] [place], [place]  [address] Phone - [phone]   Fax - [phone]  [facility]  [address] [place], [place]  [address] Phone - [phone]   Fax - [phone]  [facility]  FAMILY MEDICINE AT [facility] [address] [place], [place]  [address] Phone - [phone]   Fax - [phone]  [facility] AT [facility] [address] [place], [place]  [address] Phone - [phone]   Fax - [phone] [facility] [place] [address] [place], [place]  [address] Phone - [phone]   Fax - [phone]  [facility] [address] [place], [place]  [address] Phone - [phone]   Fax - [phone]  [facility] [address], [address] [place], [place]  [address] Phone - [phone]   Fax - [phone]  [facility] [place] [address] [place], [place]  [address] Phone - [phone]   Fax - [phone]  [facility] [address] [place], [place]  [address] Phone - [phone]  [facility] [address] [place], [place]  [address] Phone - [phone]   Fax - [phone]  [facility] [address] [place] [place], [place]  [address] Phone - [phone]   Fax - [phone]  [facility] [address] [place], [place]  [address] Phone - [phone]   Fax - [phone]  [facility] [address] [place], [place]  [address] Phone - [phone]  Fax - [phone]   [facility] [address] [place], [place]  [address] Phone - [phone]   Fax - [phone]  [facility] [address] [place], [place]  [address] Phone - [phone]   Fax - [phone]  DAVID RUBIN [address] [place], [place]  [address] Phone - [phone]   Fax - [phone]  [facility] [address] [place], [place]  [address] Phone - [phone]   Fax - [phone]  [facility] [address] [place], [place]  [address] Phone - [phone]    Fax - [phone] Gerarda Fraction [address] [place], [place]  [address] Phone - [phone]   Fax - [phone]  [facility] [address] [place], [place]  [address] Phone - [phone]   Fax - [phone]  [facility] - [place] [address] [place], [place]  [address] Phone - [phone]   Fax - [phone]      Deberah Pelton Contractions Contractions of the uterus can occur throughout pregnancy. Contractions are not always a sign that you are in labor.  WHAT ARE BRAXTON HICKS CONTRACTIONS?  Contractions that occur before labor are called Braxton Hicks contractions, or false labor. Toward the end of pregnancy (32-34 weeks), these contractions can develop more often and may become more forceful. This is not true labor because these contractions do not result in opening (dilatation) and thinning of the cervix. They are sometimes difficult to tell apart from true labor because these contractions can be forceful and people have different pain tolerances. You should not feel embarrassed if you go to the hospital with false labor. Sometimes, the only way to tell if you are in true labor is for your health care provider to look for changes in the cervix. If there are no prenatal problems or other health problems associated with the pregnancy, it is completely safe to be sent home with false labor and await the onset of true labor. HOW CAN YOU TELL THE DIFFERENCE BETWEEN TRUE AND FALSE LABOR? False Labor 6. The contractions of false labor are usually shorter and not as hard as those of true labor.  7. The contractions are usually irregular.  8. The contractions are often felt in the front of the lower abdomen and in the groin.  9. The contractions may go away when you walk around or change positions while lying down.  10. The contractions get weaker and are shorter lasting as time goes on.  11. The contractions do not  usually become progressively stronger, regular, and closer together as with true labor.  True Labor 2. Contractions in true labor last 30-70 seconds, become very regular, usually become more intense, and increase in frequency.  3. The contractions do not go away with walking.  4. The discomfort is usually felt in the top of the uterus and spreads to the lower abdomen and low back.  5. True labor can be determined by your health care provider with an exam. This will show that the cervix is dilating and getting thinner.  WHAT TO REMEMBER 2. Keep up with your usual exercises and follow other instructions given by your health care provider.  3. Take medicines as directed by your health care provider.  4. Keep your regular prenatal appointments.  5. Eat and drink lightly if you think you are going into labor.  6. If Braxton Hicks contractions are  making you uncomfortable:  1. Change your position from lying down or resting to walking, or from walking to resting.  2. Sit and rest in a tub of warm water.  3. Drink 2-3 glasses of water. Dehydration may cause these contractions.  4. Do slow and deep breathing several times an hour.  WHEN SHOULD I SEEK IMMEDIATE MEDICAL CARE? Seek immediate medical care if: 2. Your contractions become stronger, more regular, and closer together.  3. You have fluid leaking or gushing from your vagina.  4. You have a fever.  5. You pass blood-tinged mucus.  6. You have vaginal bleeding.  7. You have continuous abdominal pain.  8. You have low back pain that you never had before.  9. You feel your baby's head pushing down and causing pelvic pressure.  10. Your baby is not moving as much as it used to.  Document Released: 10/23/2005 Document Revised: 10/28/2013 Document Reviewed: 08/04/2013 [facility] Patient Information 2015 [place], [place]. This information is not intended to replace advice given to you by your health care provider. Make sure you  discuss any questions you have with your health care provider.

## 2015-07-30 NOTE — Progress Notes (Signed)
Low-risk OB appointment G2P0010 [redacted]w[redacted]d Estimated Date of Delivery: 08/16/15 BP 100/70 mmHg  Pulse 72  Wt 190 lb 6.4 oz (86.365 kg)  LMP  (LMP Unknown)  BP, weight, and urine reviewed.  Refer to obstetrical flow sheet for FH & FHR.  Reports good fm.  Denies regular uc's, lof, vb, or uti s/s. No complaints. GBS collected SVE per request: unable to reach cx, very posterior, soft, vtx Reviewed labor s/s, fkc. Plan:  Continue routine obstetrical care  F/U in 1wk for OB appointment

## 2015-08-02 LAB — GC/CHLAMYDIA PROBE AMP
Chlamydia trachomatis, NAA: NEGATIVE
Neisseria gonorrhoeae by PCR: NEGATIVE

## 2015-08-02 LAB — CULTURE, BETA STREP (GROUP B ONLY): STREP GP B CULTURE: POSITIVE — AB

## 2015-08-04 ENCOUNTER — Ambulatory Visit (INDEPENDENT_AMBULATORY_CARE_PROVIDER_SITE_OTHER): Payer: Medicaid Other | Admitting: Obstetrics & Gynecology

## 2015-08-04 VITALS — BP 118/64 | HR 84 | Wt 192.0 lb

## 2015-08-04 DIAGNOSIS — Z3493 Encounter for supervision of normal pregnancy, unspecified, third trimester: Secondary | ICD-10-CM

## 2015-08-04 DIAGNOSIS — Z1389 Encounter for screening for other disorder: Secondary | ICD-10-CM

## 2015-08-04 DIAGNOSIS — Z331 Pregnant state, incidental: Secondary | ICD-10-CM

## 2015-08-04 LAB — POCT URINALYSIS DIPSTICK
Blood, UA: NEGATIVE
GLUCOSE UA: NEGATIVE
Ketones, UA: NEGATIVE
Leukocytes, UA: NEGATIVE
Nitrite, UA: NEGATIVE

## 2015-08-04 NOTE — Progress Notes (Signed)
G2P0010 [redacted]w[redacted]d Estimated Date of Delivery: 08/16/15  Blood pressure 118/64, pulse 84, weight 192 lb (87.091 kg).   BP weight and urine results all reviewed and noted.  Please refer to the obstetrical flow sheet for the fundal height and fetal heart rate documentation:  Patient reports good fetal movement, denies any bleeding and no rupture of membranes symptoms or regular contractions. Patient is without complaints. All questions were answered.  Orders Placed This Encounter  Procedures  . POCT urinalysis dipstick    Plan:  Continued routine obstetrical care,   Return in about 1 week (around 08/11/2015) for LROB.

## 2015-08-11 ENCOUNTER — Encounter: Payer: Medicaid Other | Admitting: Obstetrics & Gynecology

## 2015-08-11 ENCOUNTER — Encounter: Payer: Self-pay | Admitting: Obstetrics and Gynecology

## 2015-08-11 ENCOUNTER — Ambulatory Visit (INDEPENDENT_AMBULATORY_CARE_PROVIDER_SITE_OTHER): Payer: Medicaid Other | Admitting: Obstetrics and Gynecology

## 2015-08-11 VITALS — BP 110/68 | HR 84 | Wt 194.0 lb

## 2015-08-11 DIAGNOSIS — Z1389 Encounter for screening for other disorder: Secondary | ICD-10-CM

## 2015-08-11 DIAGNOSIS — Z3483 Encounter for supervision of other normal pregnancy, third trimester: Secondary | ICD-10-CM

## 2015-08-11 DIAGNOSIS — Z331 Pregnant state, incidental: Secondary | ICD-10-CM

## 2015-08-11 LAB — POCT URINALYSIS DIPSTICK
GLUCOSE UA: NEGATIVE
Ketones, UA: NEGATIVE
Leukocytes, UA: NEGATIVE
NITRITE UA: NEGATIVE
RBC UA: NEGATIVE

## 2015-08-11 NOTE — Progress Notes (Signed)
Patient ID: Gail Fields, female   DOB: 09-12-1986, 29 y.o.   MRN: [id number]   G2P0010 [redacted]w[redacted]d Estimated Date of Delivery: 08/16/15  Blood pressure 110/68, pulse 84, weight 194 lb (87.998 kg).  Pt did NOT attend childbirth classes. refer to the ob flow sheet for FH and FHR, also BP, Wt, Urine results: trace protein  Patient reports  + good fetal movement, denies any bleeding and no rupture of membranes symptoms or regular contractions. Patient has no complaints  FH: 37cm  FHR: 124 Edema: none Cervix:  exam done and cervix is 2cm long -2 vertex  Questions were answered. Assessment: [redacted]w[redacted]d G2P0010 Plan:  Continued routine obstetrical care for supervision of normal pregancy            General guidelines for IOL for post dates reviewed with pt  F/u in 1 weeks for routine obstetrical care  By signing my name below, I, Jarvis Morgan, attest that this documentation has been prepared under the direction and in the presence of Tilda Burrow, MD. Electronically Signed: Jarvis Morgan, ED Scribe. 08/11/2015. 3:36 PM.  I personally performed the services described in this documentation, which was SCRIBED in my presence. The recorded information has been reviewed and considered accurate. It has been edited as necessary during review. Tilda Burrow, MD

## 2015-08-11 NOTE — Progress Notes (Signed)
Pt denies any problems or concerns at this time.  

## 2015-08-12 ENCOUNTER — Encounter: Payer: Medicaid Other | Admitting: Obstetrics & Gynecology

## 2015-08-16 ENCOUNTER — Telehealth: Payer: Self-pay | Admitting: *Deleted

## 2015-08-16 ENCOUNTER — Telehealth: Payer: Self-pay | Admitting: Obstetrics & Gynecology

## 2015-08-16 NOTE — Telephone Encounter (Signed)
Pt requesting to get induced this Thursday. Pt states she was supposed to get induced on Friday. Pt states has a appt this Wednesday with Dr. Emelda Fear. Pt informed will route this message to Dr. Emelda Fear to see if he would be willing to change the induction date.

## 2015-08-16 NOTE — Telephone Encounter (Signed)
Duplicate message. 

## 2015-08-18 ENCOUNTER — Telehealth: Payer: Self-pay | Admitting: Advanced Practice Midwife

## 2015-08-18 ENCOUNTER — Inpatient Hospital Stay (HOSPITAL_COMMUNITY): Payer: Medicaid Other | Admitting: Anesthesiology

## 2015-08-18 ENCOUNTER — Ambulatory Visit (INDEPENDENT_AMBULATORY_CARE_PROVIDER_SITE_OTHER): Payer: Medicaid Other | Admitting: Obstetrics and Gynecology

## 2015-08-18 ENCOUNTER — Inpatient Hospital Stay (HOSPITAL_COMMUNITY)
Admission: AD | Admit: 2015-08-18 | Discharge: 2015-08-21 | DRG: 775 | Disposition: A | Discharge status: Home / Self Care | Payer: Medicaid Other | Source: Ambulatory Visit | Attending: Obstetrics and Gynecology | Admitting: Obstetrics and Gynecology

## 2015-08-18 ENCOUNTER — Encounter (HOSPITAL_COMMUNITY): Payer: Self-pay | Admitting: *Deleted

## 2015-08-18 VITALS — BP 120/70 | HR 78 | Wt 198.0 lb

## 2015-08-18 DIAGNOSIS — Z3A40 40 weeks gestation of pregnancy: Secondary | ICD-10-CM | POA: Diagnosis not present

## 2015-08-18 DIAGNOSIS — O99824 Streptococcus B carrier state complicating childbirth: Secondary | ICD-10-CM | POA: Diagnosis present

## 2015-08-18 DIAGNOSIS — O480 Post-term pregnancy: Secondary | ICD-10-CM | POA: Diagnosis not present

## 2015-08-18 DIAGNOSIS — Z331 Pregnant state, incidental: Secondary | ICD-10-CM

## 2015-08-18 DIAGNOSIS — IMO0001 Reserved for inherently not codable concepts without codable children: Secondary | ICD-10-CM

## 2015-08-18 DIAGNOSIS — Z3493 Encounter for supervision of normal pregnancy, unspecified, third trimester: Secondary | ICD-10-CM

## 2015-08-18 DIAGNOSIS — Z1389 Encounter for screening for other disorder: Secondary | ICD-10-CM

## 2015-08-18 LAB — CBC
HEMATOCRIT: 37.5 % (ref 36.0–46.0)
HEMOGLOBIN: 13.1 g/dL (ref 12.0–15.0)
MCH: 32.2 pg (ref 26.0–34.0)
MCHC: 34.9 g/dL (ref 30.0–36.0)
MCV: 92.1 fL (ref 78.0–100.0)
Platelets: 166 10*3/uL (ref 150–400)
RBC: 4.07 MIL/uL (ref 3.87–5.11)
RDW: 13.5 % (ref 11.5–15.5)
WBC: 12.6 10*3/uL — AB (ref 4.0–10.5)

## 2015-08-18 LAB — ABO/RH: ABO/RH(D): A POS

## 2015-08-18 LAB — TYPE AND SCREEN
ABO/RH(D): A POS
ANTIBODY SCREEN: NEGATIVE

## 2015-08-18 MED ORDER — EPHEDRINE 5 MG/ML INJ: 10.0000 mg | INTRAVENOUS | Status: DC | PRN

## 2015-08-18 MED ORDER — FENTANYL 2.5 MCG/ML BUPIVACAINE 1/10 % EPIDURAL INFUSION (WH - ANES)
14.0000 mL/h | INTRAMUSCULAR | Status: DC | PRN
  Administered 2015-08-18: 14 mL/h via EPIDURAL
  Filled 2015-08-18 (×2): qty 125

## 2015-08-18 MED ORDER — OXYTOCIN BOLUS FROM INFUSION
500.0000 mL | INTRAVENOUS | Status: DC
  Administered 2015-08-19: 500 mL via INTRAVENOUS

## 2015-08-18 MED ORDER — CITRIC ACID-SODIUM CITRATE 334-500 MG/5ML PO SOLN
30.0000 mL | ORAL | Status: DC | PRN
  Administered 2015-08-19: 30 mL via ORAL
  Filled 2015-08-18 (×2): qty 15

## 2015-08-18 MED ORDER — ACETAMINOPHEN 325 MG PO TABS: 650.0000 mg | ORAL_TABLET | ORAL | Status: DC | PRN

## 2015-08-18 MED ORDER — LIDOCAINE HCL (PF) 1 % IJ SOLN
30.0000 mL | INTRAMUSCULAR | Status: AC | PRN
  Administered 2015-08-18 (×2): 8 mL via SUBCUTANEOUS
  Filled 2015-08-18: qty 30

## 2015-08-18 MED ORDER — OXYTOCIN 40 UNITS IN LACTATED RINGERS INFUSION - SIMPLE MED: 62.5000 mL/h | INTRAVENOUS | Status: DC

## 2015-08-18 MED ORDER — LACTATED RINGERS IV SOLN
500.0000 mL | INTRAVENOUS | Status: DC | PRN
  Administered 2015-08-19 (×2): 500 mL via INTRAVENOUS

## 2015-08-18 MED ORDER — PHENYLEPHRINE 40 MCG/ML (10ML) SYRINGE FOR IV PUSH (FOR BLOOD PRESSURE SUPPORT)
80.0000 ug | PREFILLED_SYRINGE | INTRAVENOUS | Status: DC | PRN
  Filled 2015-08-18: qty 20

## 2015-08-18 MED ORDER — FENTANYL CITRATE (PF) 100 MCG/2ML IJ SOLN
100.0000 ug | INTRAMUSCULAR | Status: DC | PRN
  Administered 2015-08-18: 100 ug via INTRAVENOUS
  Filled 2015-08-18: qty 2

## 2015-08-18 MED ORDER — OXYCODONE-ACETAMINOPHEN 5-325 MG PO TABS: 2.0000 | ORAL_TABLET | ORAL | Status: DC | PRN

## 2015-08-18 MED ORDER — AMPICILLIN SODIUM 2 G IJ SOLR
2.0000 g | Freq: Once | INTRAMUSCULAR | Status: AC
  Administered 2015-08-18: 2 g via INTRAVENOUS
  Filled 2015-08-18: qty 2000

## 2015-08-18 MED ORDER — LACTATED RINGERS IV SOLN
INTRAVENOUS | Status: DC
  Administered 2015-08-19: 09:00:00 via INTRAVENOUS

## 2015-08-18 MED ORDER — OXYCODONE-ACETAMINOPHEN 5-325 MG PO TABS
1.0000 | ORAL_TABLET | ORAL | Status: DC | PRN
  Administered 2015-08-19: 1 via ORAL
  Filled 2015-08-18 (×2): qty 1

## 2015-08-18 MED ORDER — DIPHENHYDRAMINE HCL 50 MG/ML IJ SOLN: 12.5000 mg | INTRAMUSCULAR | Status: DC | PRN

## 2015-08-18 MED ORDER — ONDANSETRON HCL 4 MG/2ML IJ SOLN: 4.0000 mg | Freq: Four times a day (QID) | INTRAMUSCULAR | Status: DC | PRN

## 2015-08-18 NOTE — Progress Notes (Signed)
Patient ID: Gail LamerLeslie Fields, female   DOB: 25-Jun-1986, 29 y.o.   MRN: [id number]  G2P0010 3636w2d Estimated Date of Delivery: 08/16/15  Blood pressure 120/70, pulse 78, weight 198 lb (89.812 kg).  Pt did not attend birthing classes refer to the ob flow sheet for FH and FHR, also BP, Wt, Urine results:notable for   Patient reports  + good fetal movement, denies any bleeding and no rupture of membranes symptoms or regular contractions. Patient complaints: No complaints at this time. Her due date was 08/16/15  3 cm 20% soft -2, soft, Vertex FHR: 132 FH: 41 cm Edema: none  Questions were answered. Assessment: 3036w2d G2P0010 Plan:  Continued routine obstetrical care, Will schedule induction date @ 41 weeks   By signing my name below, I, Jarvis Morganaylor Rosaire Cueto, attest that this documentation has been prepared under the direction and in the presence of Tilda BurrowJohn V. Semaj Coburn MD Electronically Signed: Jarvis Morganaylor Rovena Hearld, ED Scribe. 08/18/2015. 11:29 AM.  I personally performed the services described in this documentation, which was SCRIBED in my presence. The recorded information has been reviewed and considered accurate. It has been edited as necessary during review. Tilda BurrowFERGUSON,Diamantina Edinger V, MD

## 2015-08-18 NOTE — Progress Notes (Signed)
Pt denies any problems or concerns at this time.  

## 2015-08-18 NOTE — Anesthesia Preprocedure Evaluation (Signed)
Anesthesia Evaluation  Patient identified by MRN, date of birth, ID band Patient awake    Reviewed: Allergy & Precautions, H&P , NPO status , Patient's Chart, lab work & pertinent test results  Airway Mallampati: I  TM Distance: >3 FB Neck ROM: full    Dental no notable dental hx.    Pulmonary neg pulmonary ROS,    Pulmonary exam normal        Cardiovascular negative cardio ROS Normal cardiovascular exam     Neuro/Psych negative neurological ROS  negative psych ROS   GI/Hepatic negative GI ROS, Neg liver ROS,   Endo/Other  negative endocrine ROS  Renal/GU negative Renal ROS     Musculoskeletal   Abdominal (+) + obese,   Peds  Hematology negative hematology ROS (+)   Anesthesia Other Findings   Reproductive/Obstetrics (+) Pregnancy                             Anesthesia Physical Anesthesia Plan  ASA: II  Anesthesia Plan: Epidural   Post-op Pain Management:    Induction:   Airway Management Planned:   Additional Equipment:   Intra-op Plan:   Post-operative Plan:   Informed Consent: I have reviewed the patients History and Physical, chart, labs and discussed the procedure including the risks, benefits and alternatives for the proposed anesthesia with the patient or authorized representative who has indicated his/her understanding and acceptance.     Plan Discussed with:   Anesthesia Plan Comments:         Anesthesia Quick Evaluation  

## 2015-08-18 NOTE — MAU Note (Signed)
Pt presents complaining of contractions and bloody show all day. Pt in wheelchair at desk and very uncomfortable. Brought straight to room

## 2015-08-18 NOTE — Telephone Encounter (Signed)
Pt wanted to be induced tomorrow and per Dr. Emelda FearFerguson there are no slots. Pt was informed of this.

## 2015-08-18 NOTE — H&P (Signed)
Gail Fields is a 29 y.o. female G2P0010 @ 40.2wks by 8wk U/S presenting for reg ctx since 8p. Denies H/A, RUQ pain, N/V/D. [facility] and has been remarkable for 1) GBS pos  History OB History    Gravida Para Term Preterm AB TAB SAB Ectopic Multiple Living   2    1 1          Past Medical History  Diagnosis Date  . Hemorrhoid   . BV (bacterial vaginosis)   . Acute cystitis   . Trichimoniasis   . 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 Patellofemoral stress syndrome   . Hx of chlamydia infection   . Yeast infection   . Vaginal Pap smear, abnormal 09/22/14    ASCUS- +HPV   Past Surgical History  Procedure Laterality Date  . No past surgeries     Family History: family history includes Cancer in her maternal grandmother; Seizures in her paternal grandmother. Social History:  reports that she has never smoked. She has never used smokeless tobacco. She reports that she does not drink alcohol or use illicit drugs.   Prenatal Transfer Tool  Maternal Diabetes: No Genetic Screening: Normal Maternal Ultrasounds/Referrals: Normal Fetal Ultrasounds or other Referrals:  None Maternal Substance Abuse:  No Significant Maternal Medications:  None Significant Maternal Lab Results:  Lab values include: Group B Strep positive Other Comments:  None  ROS   BP 124/84, 76, 98.2 Exam Physical Exam  Constitutional: She is oriented to person, place, and time. She appears well-developed.  HENT:  Head: Normocephalic.  Neck: Normal range of motion.  Cardiovascular: Normal rate.   Respiratory: Effort normal.  GI:  EFM 120s, +10x10accels, occ mi variables Ctx q 2-3 mins  Genitourinary:  Cx 7/90/-2 per RN  Musculoskeletal: Normal range of motion.  Neurological: She is alert and oriented to person, place, and time.  Skin: Skin is warm and dry.  Psychiatric: She has a normal mood and affect. Her behavior is normal. Thought content normal.    Prenatal  labs: ABO, Rh: A/--/-- (03/16 1637) Antibody: Negative (08/17 0926) Rubella:  imm 01/20/15 RPR: Non Reactive (08/17 0926)  HBsAg: Negative (03/16 1637)  HIV:   NR 06/23/15 GBS:   pos 07/30/15  Assessment/Plan: IUP@40 .2wks Active labor GBS pos  [facility] due to advanced labor Expectant management Anticipate SVD   Cam HaiSHAW, KIMBERLY CNM 08/18/2015, 9:53 PM

## 2015-08-19 ENCOUNTER — Encounter (HOSPITAL_COMMUNITY): Payer: Self-pay | Admitting: Obstetrics

## 2015-08-19 DIAGNOSIS — O99824 Streptococcus B carrier state complicating childbirth: Secondary | ICD-10-CM

## 2015-08-19 DIAGNOSIS — Z3A40 40 weeks gestation of pregnancy: Secondary | ICD-10-CM

## 2015-08-19 LAB — RPR: RPR: NONREACTIVE

## 2015-08-19 MED ORDER — PHENYLEPHRINE HCL 10 MG/ML IJ SOLN
INTRAMUSCULAR | Status: DC | PRN
  Administered 2015-08-18 (×8): 40 ug via INTRAVENOUS

## 2015-08-19 MED ORDER — LACTATED RINGERS IV SOLN: INTRAVENOUS | Status: DC

## 2015-08-19 MED ORDER — OXYCODONE-ACETAMINOPHEN 5-325 MG PO TABS: 2.0000 | ORAL_TABLET | ORAL | Status: DC | PRN

## 2015-08-19 MED ORDER — OXYCODONE-ACETAMINOPHEN 5-325 MG PO TABS
1.0000 | ORAL_TABLET | ORAL | Status: DC | PRN
  Administered 2015-08-19 – 2015-08-21 (×3): 1 via ORAL
  Filled 2015-08-19 (×3): qty 1

## 2015-08-19 MED ORDER — LANOLIN HYDROUS EX OINT: TOPICAL_OINTMENT | CUTANEOUS | Status: DC | PRN

## 2015-08-19 MED ORDER — DOCUSATE SODIUM 100 MG PO CAPS
100.0000 mg | ORAL_CAPSULE | Freq: Two times a day (BID) | ORAL | Status: DC
  Administered 2015-08-20 – 2015-08-21 (×4): 100 mg via ORAL
  Filled 2015-08-19 (×4): qty 1

## 2015-08-19 MED ORDER — DIBUCAINE 1 % RE OINT
1.0000 "application " | TOPICAL_OINTMENT | RECTAL | Status: DC | PRN
  Administered 2015-08-20: 1 via RECTAL
  Filled 2015-08-19: qty 28

## 2015-08-19 MED ORDER — TETANUS-DIPHTH-ACELL PERTUSSIS 5-2.5-18.5 LF-MCG/0.5 IM SUSP
0.5000 mL | Freq: Once | INTRAMUSCULAR | Status: AC
  Administered 2015-08-20: 0.5 mL via INTRAMUSCULAR

## 2015-08-19 MED ORDER — BENZOCAINE-MENTHOL 20-0.5 % EX AERO
1.0000 "application " | INHALATION_SPRAY | CUTANEOUS | Status: DC | PRN
  Administered 2015-08-19: 1 via TOPICAL
  Filled 2015-08-19: qty 56

## 2015-08-19 MED ORDER — SENNOSIDES-DOCUSATE SODIUM 8.6-50 MG PO TABS
2.0000 | ORAL_TABLET | ORAL | Status: DC
  Administered 2015-08-20 (×2): 2 via ORAL
  Filled 2015-08-19: qty 2

## 2015-08-19 MED ORDER — SODIUM CHLORIDE 0.9 % IV SOLN
1.0000 g | Freq: Four times a day (QID) | INTRAVENOUS | Status: DC
  Administered 2015-08-19: 1 g via INTRAVENOUS
  Filled 2015-08-19 (×4): qty 1000

## 2015-08-19 MED ORDER — WITCH HAZEL-GLYCERIN EX PADS
1.0000 "application " | MEDICATED_PAD | CUTANEOUS | Status: DC | PRN
  Administered 2015-08-20: 1 via TOPICAL

## 2015-08-19 MED ORDER — ZOLPIDEM TARTRATE 5 MG PO TABS: 5.0000 mg | ORAL_TABLET | Freq: Every evening | ORAL | Status: DC | PRN

## 2015-08-19 MED ORDER — PRENATAL MULTIVITAMIN CH
1.0000 | ORAL_TABLET | Freq: Every day | ORAL | Status: DC
  Administered 2015-08-20 – 2015-08-21 (×2): 1 via ORAL
  Filled 2015-08-19 (×2): qty 1

## 2015-08-19 MED ORDER — BUPIVACAINE HCL (PF) 0.5 % IJ SOLN
INTRAMUSCULAR | Status: AC
  Filled 2015-08-19: qty 30

## 2015-08-19 MED ORDER — IBUPROFEN 600 MG PO TABS
600.0000 mg | ORAL_TABLET | Freq: Four times a day (QID) | ORAL | Status: DC
  Administered 2015-08-19 – 2015-08-21 (×8): 600 mg via ORAL
  Filled 2015-08-19 (×7): qty 1

## 2015-08-19 MED ORDER — MEASLES, MUMPS & RUBELLA VAC ~~LOC~~ INJ: 0.5000 mL | INJECTION | Freq: Once | SUBCUTANEOUS | Status: DC

## 2015-08-19 MED ORDER — SIMETHICONE 80 MG PO CHEW: 80.0000 mg | CHEWABLE_TABLET | ORAL | Status: DC | PRN

## 2015-08-19 MED ORDER — SODIUM BICARBONATE 8.4 % IV SOLN
INTRAVENOUS | Status: AC | PRN
  Administered 2015-08-18: 5 mL via EPIDURAL
  Administered 2015-08-18: 2 mL via EPIDURAL

## 2015-08-19 MED ORDER — ACETAMINOPHEN 325 MG PO TABS: 650.0000 mg | ORAL_TABLET | ORAL | Status: DC | PRN

## 2015-08-19 MED ORDER — ONDANSETRON HCL 4 MG/2ML IJ SOLN: 4.0000 mg | INTRAMUSCULAR | Status: DC | PRN

## 2015-08-19 MED ORDER — FENTANYL 2.5 MCG/ML BUPIVACAINE 1/10 % EPIDURAL INFUSION (WH - ANES): 14.0000 mL/h | INTRAMUSCULAR | Status: DC | PRN

## 2015-08-19 MED ORDER — DIPHENHYDRAMINE HCL 25 MG PO CAPS: 25.0000 mg | ORAL_CAPSULE | Freq: Four times a day (QID) | ORAL | Status: DC | PRN

## 2015-08-19 MED ORDER — OXYTOCIN 40 UNITS IN LACTATED RINGERS INFUSION - SIMPLE MED: 62.5000 mL/h | INTRAVENOUS | Status: DC | PRN

## 2015-08-19 MED ORDER — OXYTOCIN 40 UNITS IN LACTATED RINGERS INFUSION - SIMPLE MED
1.0000 m[IU]/min | INTRAVENOUS | Status: DC
  Administered 2015-08-19: 1 m[IU]/min via INTRAVENOUS
  Administered 2015-08-19: 2 m[IU]/min via INTRAVENOUS
  Filled 2015-08-19: qty 1000

## 2015-08-19 MED ORDER — TERBUTALINE SULFATE 1 MG/ML IJ SOLN
0.2500 mg | Freq: Once | INTRAMUSCULAR | Status: DC | PRN
Start: 2015-08-19 — End: 2015-08-19

## 2015-08-19 MED ORDER — ONDANSETRON HCL 4 MG PO TABS: 4.0000 mg | ORAL_TABLET | ORAL | Status: DC | PRN

## 2015-08-19 MED ORDER — BUPIVACAINE HCL (PF) 0.5 % IJ SOLN
INTRAMUSCULAR | Status: AC
Start: 2015-08-19 — End: 2015-08-19
  Filled 2015-08-19: qty 30

## 2015-08-19 NOTE — Anesthesia Preprocedure Evaluation (Addendum)
Anesthesia Evaluation  Patient identified by MRN, date of birth, ID band Patient awake    Reviewed: Allergy & Precautions, H&P , NPO status , Patient's Chart, lab work & pertinent test results  Airway Mallampati: II  TM Distance: >3 FB Neck ROM: full    Dental no notable dental hx.    Pulmonary neg pulmonary ROS,    Pulmonary exam normal        Cardiovascular Exercise Tolerance: Good negative cardio ROS Normal cardiovascular exam     Neuro/Psych negative neurological ROS  negative psych ROS   GI/Hepatic negative GI ROS, Neg liver ROS,   Endo/Other  negative endocrine ROS  Renal/GU negative Renal ROS  negative genitourinary   Musculoskeletal   Abdominal Normal abdominal exam  (+)   Peds  Hematology negative hematology ROS (+)   Anesthesia Other Findings   Reproductive/Obstetrics negative OB ROS (+) Pregnancy                             Anesthesia Physical Anesthesia Plan  ASA: II  Anesthesia Plan: Epidural   Post-op Pain Management:    Induction:   Airway Management Planned:   Additional Equipment:   Intra-op Plan:   Post-operative Plan:   Informed Consent: I have reviewed the patients History and Physical, chart, labs and discussed the procedure including the risks, benefits and alternatives for the proposed anesthesia with the patient or authorized representative who has indicated his/her understanding and acceptance.     Plan Discussed with:   Anesthesia Plan Comments:        Anesthesia Quick Evaluation

## 2015-08-19 NOTE — Progress Notes (Signed)
Informed Dr Berneice HeinrichManny no epidural line on patient chart to chart against. Received order to remove Epidural Catheter. Catheter removed by Doloris HallJenny Middleton RN at [phone] 08/19/15

## 2015-08-19 NOTE — Anesthesia Procedure Notes (Addendum)
Epidural Patient location during procedure: OB Start time: 08/18/2015 10:29 PM End time: 08/18/2015 10:33 PM  Staffing Anesthesiologist: Leilani AbleHATCHETT, Gissel Keilman Performed by: anesthesiologist   Preanesthetic Checklist Completed: patient identified, surgical consent, pre-op evaluation, timeout performed, IV checked, risks and benefits discussed and monitors and equipment checked  Epidural Patient position: sitting Prep: site prepped and draped and DuraPrep Patient monitoring: continuous pulse ox and blood pressure Approach: midline Injection technique: LOR air  Needle:  Needle type: Tuohy  Needle gauge: 17 G Needle length: 9 cm and 9 Needle insertion depth: 5 cm cm Catheter type: closed end flexible Catheter size: 19 Gauge Catheter at skin depth: 10 cm Test dose: negative  Assessment Events: blood not aspirated, injection not painful, no injection resistance, negative IV test and no paresthesia  Additional Notes Reason for block:procedure for pain

## 2015-08-19 NOTE — Progress Notes (Signed)
Gail Fields is a 29 y.o. G2P0010 at 3442w3d   Subjective: Comfortable w/ epidural other than some upper back pain  Objective: BP 118/74 mmHg  Pulse 74  Temp(Src) 98.6 F (37 C) (Oral)  Resp 18  Ht 5\' 6"  (1.676 m)  Wt 89.812 kg (198 lb)  BMI 31.97 kg/m2  SpO2 100%  LMP  (LMP Unknown)   Total I/O In: -  Out: 100 [Urine:100]  FHT:  FHR: 120s bpm, variability: moderate,  accelerations:  Present,  decelerations:  Present during repositioning after exam pt w/ decel to 60s x 2 mins UC:   regular, every 3-5 minutes SVE:   Dilation: Lip/rim Effacement (%): 100 Station: -1 Exam by:: Irving BurtonEmily Rothermel RN  - stretchy ant lip, vtx @ -1 per Dr Emelda FearFerguson; pelvis more android in shape; IUPC placed Labs: Lab Results  Component Value Date   WBC 12.6* 08/18/2015   HGB 13.1 08/18/2015   HCT 37.5 08/18/2015   MCV 92.1 08/18/2015   PLT 166 08/18/2015    Assessment / Plan: Protracted active secondary to decreased ctx  Allow FHR to rest x 20 mins and then begin Pit 1x1  Jonea Bukowski CNM 08/19/2015, 6:11 AM

## 2015-08-19 NOTE — Progress Notes (Signed)
Instructed by Dr Ashok PallWouk to Labor Down [id number] for 30-40 minutes then begin pushing again

## 2015-08-19 NOTE — Anesthesia Procedure Notes (Signed)
Procedures

## 2015-08-19 NOTE — Progress Notes (Signed)
Gail Fields is a 29 y.o. G2P0010 at 4135w3d by LMP admitted for active labor  Subjective: called to see pt due to fetal heart rate decel to 90's. Pt in spont labor , with heavy analgesia from epidural , and on LLD position. FSE in place.   Objective: BP 108/74 mmHg  Pulse 89  Resp 18  Ht 5\' 6"  (1.676 m)  Wt 89.812 kg (198 lb)  BMI 31.97 kg/m2  SpO2 100%  LMP  (LMP Unknown)   Total I/O In: -  Out: 100 [Urine:100]  FHT:  FHR: 135 before, 90's now, FSE dislodged and replaced. bpm, variability: minimal ,  accelerations:  Present,  decelerations:  Absent promptly resolved UC:   irregular, every 4 minutes SVE:   Dilation: 7 Effacement (%): 90 Station: -2 Exam by:: Gail Fields RNC 8 /100/-2. Labs: Lab Results  Component Value Date   WBC 12.6* 08/18/2015   HGB 13.1 08/18/2015   HCT 37.5 08/18/2015   MCV 92.1 08/18/2015   PLT 166 08/18/2015    Assessment / Plan: Spontaneous labor, progressing normally resolved prolonged decel  Labor: Progressing normally Preeclampsia:   Fetal Wellbeing:  Category II and resolved , now cat I Pain Control:  Epidural I/D:  n/a Anticipated MOD:  NSVD  Anntonette Madewell V 08/19/2015, 1:54 AM

## 2015-08-19 NOTE — Progress Notes (Signed)
Gail Fields is a 29 y.o. G2P0010 at 592w3d   Subjective: Comfortable w/ epidural  Objective: BP 131/80 mmHg  Pulse 78  Temp(Src) 98.6 F (37 C) (Oral)  Resp 18  Ht 5\' 6"  (1.676 m)  Wt 89.812 kg (198 lb)  BMI 31.97 kg/m2  SpO2 100%  LMP  (LMP Unknown)   Total I/O In: -  Out: 100 [Urine:100]  FHT:  FHR: 120s bpm, variability: minimal ,  accelerations:  Abscent,  decelerations:  Present decel x173mins to nadir of 60s; had returned to BL when I arrived UC:   irregular, every 3-6 minutes SVE:   Dilation: Lip/rim Effacement (%): 100 Station: -1 Exam by:: E. Rothermel RN   Labs: Lab Results  Component Value Date   WBC 12.6* 08/18/2015   HGB 13.1 08/18/2015   HCT 37.5 08/18/2015   MCV 92.1 08/18/2015   PLT 166 08/18/2015    Assessment / Plan: Protracted active phase FHR decel  Will allow FHR to stablize and then begin Pit 1x1 to achieve complete dilation  Gail Fields CNM 08/19/2015, 4:33 AM

## 2015-08-20 NOTE — Lactation Note (Signed)
This note was copied from the chart of Gail Jocelyn LamerLeslie Marmo. Lactation Consultation Note  Patient Name: Gail Fields ZOXWR'UToday's Date: 08/20/2015 Reason for consult: Initial assessment   Initial Consult with 25 hour old infant. Infant with 5 BF for 15-30 minutes, 4 attempts, 2 voids, 4 stools and 1 % weight loss. Mom reports infant is picking up on feedings today and doing better. She denies nipple pain or tenderness. Discussed BF basics, positioning, assessing for flanging lips, I/O, stomach size, nutritional needs, NL Nb feeding behaviors including cluster feeds. Parents voiced understanding. LC Brochure given, reviewed OP LC Services, Support Groups, and BF Resources. Infant was resting quietly in grandmothers arms. Enc mom to feed 8-12 x in 24 hours at first feeding cues. Enc. Mom to call prn questions/concers/assistance.   Maternal Data Formula Feeding for Exclusion: No Does the patient have breastfeeding experience prior to this delivery?: No  Feeding Feeding Type: Breast Fed Length of feed: 20 min  LATCH Score/Interventions                      Lactation Tools Discussed/Used     Consult Status Consult Status: Follow-up Date: 08/21/15 Follow-up type: In-patient    Gail Fields 08/20/2015, 2:31 PM

## 2015-08-20 NOTE — Progress Notes (Signed)
Post Partum Day 1  Subjective:  Gail Fields is a 29 y.o. G2P1011 3671w3d s/p VAVD @ 1230 08/19/15.  No acute events overnight.  Pt denies problems with ambulating, voiding or po intake.  She had one episode of vomiting yesterday but denies nausea today.  Pain is moderately controlled.  She has had flatus. She has not had bowel movement.  Lochia Small.  Plan for birth control is oral progesterone-only contraceptive.  Method of Feeding: Breast and Bottle.  Objective: BP 112/60 mmHg  Pulse 74  Temp(Src) 98.6 F (37 C) (Oral)  Resp 19  Ht 5\' 6"  (1.676 m)  Wt 89.812 kg (198 lb)  BMI 31.97 kg/m2  SpO2 100%  LMP  (LMP Unknown)  Breastfeeding? Unknown  Physical Exam:  General: alert, cooperative and no distress Lochia:normal flow Chest: CTAB Heart: RRR no m/r/g Abdomen: soft, nontender Uterine Fundus: firm, below umbilicus DVT Evaluation: No evidence of DVT seen on physical exam. Extremities: No LE edema   Recent Labs  08/18/15 2150  HGB 13.1  HCT 37.5    Assessment/Plan:  ASSESSMENT: Gail Fields is a 29 y.o. G2P1011 7171w3d ppd #1 s/p VAVD doing well.   Plan for discharge tomorrow, Breastfeeding and Contraception POPs  Plans for outpatient circumcision.    LOS: 2 days   Jamelle HaringHillary M Fitzgerald 08/20/2015, 9:30 AM   OB fellow attestation Post Partum Day 1 I have seen and examined this patient and agree with above documentation in the resident's note.   Gail Fields is a 29 y.o. G2P1011 s/p NSVD.  Pt denies problems with ambulating, voiding or po intake. Pain is well controlled.  Plan for birth control is POP.  Method of Feeding: breast  PE:  BP 112/60 mmHg  Pulse 74  Temp(Src) 98.6 F (37 C) (Oral)  Resp 19  Ht 5\' 6"  (1.676 m)  Wt 198 lb (89.812 kg)  BMI 31.97 kg/m2  SpO2 100%  LMP  (LMP Unknown)  Breastfeeding? Unknown Gen: well appearing Heart: reg rate Lungs: normal WOB Fundus firm Ext: soft, no pain, no edema  Plan for discharge: routine care, d/c  tomorrow  Federico FlakeKimberly Niles Newton, MD 4:01 PM

## 2015-08-20 NOTE — Anesthesia Postprocedure Evaluation (Signed)
Anesthesia Post Note  Patient: Gail Fields  Procedure(s) Performed: * No procedures listed *  Anesthesia type: Epidural  Patient location: Mother/Baby  Post pain: Pain level controlled  Post assessment: Post-op Vital signs reviewed  Last Vitals:  Filed Vitals:   08/20/15 0543  BP: 112/60  Pulse: 74  Temp: 37 C  Resp: 19    Post vital signs: Reviewed  Level of consciousness: awake  Complications: No apparent anesthesia complications

## 2015-08-20 NOTE — Anesthesia Postprocedure Evaluation (Signed)
  Anesthesia Post-op Note  Patient: Gail Fields  Procedure(s) Performed: * No procedures listed *  Patient Location: Mother/Baby  Anesthesia Type:Epidural  Level of Consciousness: awake  Airway and Oxygen Therapy: Patient Spontanous Breathing  Post-op Pain: mild  Post-op Assessment: Patient's Cardiovascular Status Stable and Respiratory Function Stable              Post-op Vital Signs: stable  Last Vitals:  Filed Vitals:   08/20/15 0543  BP: 112/60  Pulse: 74  Temp: 37 C  Resp: 19    Complications: No apparent anesthesia complications

## 2015-08-21 MED ORDER — IBUPROFEN 600 MG PO TABS: 600.0000 mg | ORAL_TABLET | Freq: Four times a day (QID) | ORAL | Status: DC | PRN

## 2015-08-21 NOTE — Discharge Instructions (Signed)

## 2015-08-21 NOTE — Discharge Summary (Signed)
OB Discharge Summary     Patient Name: Gail Fields DOB: 1986-01-20 MRN: [id number]  Date of admission: 08/18/2015 Delivering MD: Jaynie CollinsANYANWU, UGONNA A   Date of discharge: 08/21/2015  Admitting diagnosis: 40.2w labor Intrauterine pregnancy: [redacted]w[redacted]d     Secondary diagnosis: None     Discharge diagnosis: Term Pregnancy Delivered                                                                                                Post partum procedures:none  Augmentation: Pitocin  Complications: None  Hospital course:  Onset of Labor With Vaginal Delivery     29 y.o. yo G2P1011 at [redacted]w[redacted]d was admitted in Active Laboron 08/18/2015. Patient had an uncomplicated labor course as follows:  Membrane Rupture Time/Date: 1:30 AM ,08/19/2015   Intrapartum Procedures: Episiotomy: None [1]                                         Lacerations:  2nd degree [3]  Patient had a delivery of a Viable infant using a vacuum extractor- see delivery note- on 08/19/2015.  Pateint had an uncomplicated postpartum course.  She is ambulating, tolerating a regular diet, passing flatus, and urinating well. Patient is discharged home in stable condition on No discharge date for patient encounter.[name].    Physical exam  Filed Vitals:   08/19/15 2018 08/20/15 0543 08/20/15 1807 08/21/15 0539  BP: 120/64 112/60 113/86 105/59  Pulse: 62 74 76 71  Temp: 98.8 F (37.1 C) 98.6 F (37 C) 98.5 F (36.9 C) 98.3 F (36.8 C)  TempSrc: Oral Oral Oral Oral  Resp: 18 19 18 18   Height:      Weight:      SpO2: 100%      General: alert Lochia: appropriate Uterine Fundus: firm DVT Evaluation: No evidence of DVT seen on physical exam. Labs: Lab Results  Component Value Date   WBC 12.6* 08/18/2015   HGB 13.1 08/18/2015   HCT 37.5 08/18/2015   MCV 92.1 08/18/2015   PLT 166 08/18/2015   No flowsheet data found.  Discharge instruction: per After Visit Summary and "Baby and Me Booklet".  Medications:   Medication List     STOP taking these medications        acetaminophen 325 MG tablet  Commonly known as:  TYLENOL      TAKE these medications        calcium carbonate 750 MG chewable tablet  Commonly known as:  TUMS EX  Chew 2-6 tablets by mouth daily as needed for heartburn.     ibuprofen 600 MG tablet  Commonly known as:  ADVIL,MOTRIN  Take 1 tablet (600 mg total) by mouth every 6 (six) hours as needed.     prenatal vitamin w/FE, FA 27-1 MG Tabs tablet  Take 1 tablet by mouth daily at 12 noon.        Diet: routine diet  Activity: Advance as tolerated. Pelvic rest for 6 weeks.   Outpatient follow up:6 weeks  Postpartum contraception: Progesterone only pills- will get rx at [facility] visit  Newborn Data: Live born female  Birth Weight: 7 lb 9.5 oz (3445 g) APGAR: 1, 9  Baby Feeding: Breast Disposition:home with mother   08/21/2015 Gail Fields, CNM  08/21/2015

## 2015-08-21 NOTE — Lactation Note (Signed)
This note was copied from the chart of Boy Jocelyn LamerLeslie Yankovich. Lactation Consultation Note  Patient Name: Boy Jocelyn LamerLeslie Fullard ZOXWR'UToday's Date: 08/21/2015   Pecola LeisureBaby is 47 hours old , 5% weight loss, 7-3.5 oz , breastfeeding 10 -15 mins ,  consistently , Latch scores 6-7  Voids and stools adequate for age.  Sore nipple and engorgement prevention and tx reviewed.  Per mom already has a hand pump. Per mom breast are getting fuller and heavier.  Per mom will have to return to work at 4 weeks due to being out 2 weeks before she delivered. LC recommended for the 1st 1 1/2 weeks to work on breast feeding , pump if needed with hand pump.  @ 1 1/2 - 2 weeks call [employer] for a DEBP loaner to do some extra pumping after 2 feedings - or 3 to increase  supply for returning to go back to work. Also mentioned to mom to start with a medium based nipple.  Mother informed of post-discharge support and given phone number to the lactation  department, including services for phone call assistance; out-patient appointments; and  breastfeeding support group. List of other breastfeeding resources in the community given  in the handout. Encouraged mother to call for problems or concerns related to breastfeeding.  Maternal Data    Feeding/ this feeding @11  am  Feeding Type: Breast Fed Length of feed: 15 min (off and on)  [facility]               Lactation Tools Discussed/Used     Consult Status      Kathrin Greathouseorio, [place] Pechacek Ann 08/21/2015, 12:20 PM

## 2015-08-24 ENCOUNTER — Inpatient Hospital Stay (HOSPITAL_COMMUNITY): Payer: Medicaid Other

## 2015-08-24 NOTE — Telephone Encounter (Signed)
Pt delivered

## 2015-09-22 ENCOUNTER — Ambulatory Visit: Payer: Medicaid Other | Admitting: Obstetrics and Gynecology

## 2015-09-22 ENCOUNTER — Ambulatory Visit: Payer: Medicaid Other | Admitting: Women's Health

## 2015-09-29 ENCOUNTER — Ambulatory Visit: Payer: Medicaid Other | Admitting: Obstetrics and Gynecology

## 2015-10-05 ENCOUNTER — Ambulatory Visit: Payer: Medicaid Other | Admitting: Obstetrics and Gynecology

## 2015-10-07 ENCOUNTER — Ambulatory Visit (INDEPENDENT_AMBULATORY_CARE_PROVIDER_SITE_OTHER): Payer: Medicaid Other | Admitting: Obstetrics and Gynecology

## 2015-10-07 ENCOUNTER — Encounter: Payer: Self-pay | Admitting: Obstetrics and Gynecology

## 2015-10-07 VITALS — BP 120/72 | Ht 66.5 in | Wt 168.0 lb

## 2015-10-07 DIAGNOSIS — Z3202 Encounter for pregnancy test, result negative: Secondary | ICD-10-CM | POA: Diagnosis not present

## 2015-10-07 DIAGNOSIS — Z3200 Encounter for pregnancy test, result unknown: Secondary | ICD-10-CM

## 2015-10-07 LAB — POCT URINE PREGNANCY: Preg Test, Ur: NEGATIVE

## 2015-10-07 MED ORDER — NORETHIN ACE-ETH ESTRAD-FE 1-20 MG-MCG(24) PO TABS: 1.0000 | ORAL_TABLET | Freq: Every day | ORAL | Status: DC

## 2015-10-07 NOTE — Progress Notes (Signed)
  Subjective:  Gail Fields is a 29 y.o. female who presents for a 7 weeks postpartum visit  Patient concerns:She has no complaints but is wanting to discuss going on birth control pills. She has had unprotected intercourse since delivery. Pt states she has had mild bloody discharge that began today, consistent with menstrual cycle   Patient is sexually active.   The following portions of the patient's history were reviewed and updated as appropriate: allergies, current medications, past family history, past medical history, past surgical history and problem list.  Review of Systems    See Subjective, otherwise negative ROS.  Objective:  BP 120/72 mmHg  Ht 5' 6.5" (1.689 m)  Wt 168 lb (76.204 kg)  BMI 26.71 kg/m2  Breastfeeding? No  General:  alert, cooperative and no distress     Lungs: clear to auscultation bilaterally  Heart:  regular rate and rhythm, S1, S2 normal, no murmur  Abdomen: soft, non-tender; bowel sounds normal; no masses,  no organomegaly   Vulva:  normal  Vagina: Healed well at vaginal entrance; early period discharge  Cervix:  Normal and multiparous  Corpus: normal size, contour, position, consistency, mobility, non-tender  Adnexa:  normal adnexa  Uterus Small, anterior and well supported       Assessment:  1.  postpartum exam.  2. Contraception: Discussed birth control pill Rx Loestrin FE 24  Plan:  1. Will restart Lo Loestrin 24  By signing my name below, I, Gail Fields, attest that this documentation has been prepared under the direction and in the presence of Tilda BurrowJohn Diago Haik V, MD. Electronically Signed: [name], ED Scribe. 10/07/2015. 2:23 PM.  I personally performed the services described in this documentation, which was SCRIBED in my presence. The recorded information has been reviewed and considered accurate. It has been edited as necessary during review. Tilda BurrowFERGUSON,Dicky Boer V, MD

## 2015-10-07 NOTE — Progress Notes (Signed)
Patient ID: Gail LamerLeslie Fields, female   DOB: 06/13/1986, 29 y.o.   MRN: [id number] Pt here today for post partum visit. Pt states that she would like to get on the pill. Pt denies any problems or concerns at this time.

## 2016-01-01 ENCOUNTER — Other Ambulatory Visit: Payer: Self-pay | Admitting: Advanced Practice Midwife

## 2016-03-22 ENCOUNTER — Other Ambulatory Visit: Payer: Self-pay | Admitting: *Deleted

## 2016-03-23 MED ORDER — NORETHIN ACE-ETH ESTRAD-FE 1-20 MG-MCG(24) PO TABS: 1.0000 | ORAL_TABLET | Freq: Every day | ORAL | Status: DC

## 2016-10-25 ENCOUNTER — Ambulatory Visit: Payer: Medicaid Other | Admitting: Adult Health

## 2016-11-03 ENCOUNTER — Other Ambulatory Visit: Payer: Self-pay | Admitting: Obstetrics and Gynecology

## 2016-11-03 DIAGNOSIS — O3680X0 Pregnancy with inconclusive fetal viability, not applicable or unspecified: Secondary | ICD-10-CM

## 2016-11-15 ENCOUNTER — Other Ambulatory Visit: Payer: Medicaid Other

## 2016-11-15 ENCOUNTER — Encounter: Payer: Medicaid Other | Admitting: *Deleted

## 2016-11-20 ENCOUNTER — Encounter: Payer: Self-pay | Admitting: Women's Health

## 2016-11-20 ENCOUNTER — Encounter (INDEPENDENT_AMBULATORY_CARE_PROVIDER_SITE_OTHER): Payer: Self-pay

## 2016-11-20 ENCOUNTER — Ambulatory Visit (INDEPENDENT_AMBULATORY_CARE_PROVIDER_SITE_OTHER): Payer: BC Managed Care – PPO | Admitting: Adult Health

## 2016-11-20 ENCOUNTER — Telehealth: Payer: Self-pay | Admitting: *Deleted

## 2016-11-20 VITALS — BP 134/65 | HR 81 | Ht 66.0 in | Wt 157.0 lb

## 2016-11-20 DIAGNOSIS — O039 Complete or unspecified spontaneous abortion without complication: Secondary | ICD-10-CM | POA: Diagnosis not present

## 2016-11-20 MED ORDER — HYDROCODONE-ACETAMINOPHEN 5-325 MG PO TABS: 1.0000 | ORAL_TABLET | Freq: Four times a day (QID) | ORAL | 0 refills | Status: DC | PRN

## 2016-11-20 MED ORDER — MISOPROSTOL 200 MCG PO TABS: ORAL_TABLET | ORAL | 0 refills | Status: DC

## 2016-11-20 NOTE — Telephone Encounter (Signed)
Spoke with patient. She will come in today to see Genella RifeK. Booker, CNM

## 2016-11-20 NOTE — Telephone Encounter (Signed)
Patient called stating was seen at [facility] and was found to be 6-[redacted] wks pregnant with cervicitis and BV. She was prescribed an antibiotic. She is now cramping and having "bleeding like a period and clots" Pt states she has her records from the hospital but cannot fax them to us. Will speak with a provider and call pt back.

## 2016-11-20 NOTE — Progress Notes (Signed)
Subjective:     Patient ID: Gail Fields, female   DOB: [birth year], 31 y.o.   MRN: [id number]  HPI Gail Fields is a 31 year old black female in for follow up of being treated for cervicitis 11/13/16 in ER in [place], and she is pregnant.She was treated with flagyl and azithromycin and rocephin.HBG was 13.1 and QHCG was 14,114.Has been spotting since 1/8 and today has picked up and is cramping.   Review of Systems + vaginal bleeding and cramping Reviewed past medical,surgical, social and family history. Reviewed medications and allergies.     Objective:   Physical Exam BP 134/65 (BP Location: Right Arm, Patient Position: Sitting, Cuff Size: Normal)   Pulse 81   Ht 5\' 6"  (1.676 m)   Wt 157 lb (71.2 kg)   LMP 09/25/2016   BMI 25.34 kg/m   PHQ 2 score 0.  Skin warm and dry.Pelvic: external genitalia is normal in appearance no lesions, vagina:+blood and clots, US showed fluid in uterus no IUP seen,urethra has no lesions or masses noted, cervix; bulbous, and dilated, and Gail Fields in to remove tissue, GS intact with fetal pole, and clots with ring forceps,    Blood type in CHL A+.  Assessment:     Miscarriage     Plan:    Tissue sent pathology Rx cytotec 200 mcg #4 take 4 po now Rx norco 5-325 mg #30 take 1 every 6 hours prn pain, no refills Push fluids  Note given to return to work 11/23/16 Follow up in 1 week,may recheck HGB then  Review handout on Miscarriage

## 2016-11-20 NOTE — Patient Instructions (Addendum)

## 2016-11-21 ENCOUNTER — Ambulatory Visit: Payer: Medicaid Other | Admitting: Advanced Practice Midwife

## 2016-11-21 ENCOUNTER — Telehealth: Payer: Self-pay | Admitting: Adult Health

## 2016-11-21 NOTE — Telephone Encounter (Signed)
Spoke with pt. Pt is having severe cramping, passing big clots. She is taking Hydrocodone, but it's not helping. Pt is on Flagyl and wonders if she can take Cytotec with Flagyl. JAG advised can take Cytotec with Flagyl and can take 2 Hydrocodone if need be. Pt aware. Pt is drinking plenty of fluids. JSY

## 2016-11-29 ENCOUNTER — Encounter: Payer: Self-pay | Admitting: Obstetrics and Gynecology

## 2016-11-29 ENCOUNTER — Ambulatory Visit (INDEPENDENT_AMBULATORY_CARE_PROVIDER_SITE_OTHER): Payer: BC Managed Care – PPO | Admitting: Obstetrics and Gynecology

## 2016-11-29 VITALS — BP 110/62 | HR 69 | Wt 260.2 lb

## 2016-11-29 DIAGNOSIS — Z30011 Encounter for initial prescription of contraceptive pills: Secondary | ICD-10-CM

## 2016-11-29 DIAGNOSIS — O039 Complete or unspecified spontaneous abortion without complication: Secondary | ICD-10-CM

## 2016-11-29 MED ORDER — HYDROCORTISONE 2.5 % RE CREA: TOPICAL_CREAM | RECTAL | 3 refills | Status: DC

## 2016-11-29 MED ORDER — NORETHIN-ETH ESTRAD-FE BIPHAS 1 MG-10 MCG / 10 MCG PO TABS: 1.0000 | ORAL_TABLET | Freq: Every day | ORAL | 3 refills | Status: DC

## 2016-11-29 NOTE — Progress Notes (Signed)
[facility] Visit  11/29/16            Patient name: Gail Fields MRN [id number]  Date of birth: 1986-09-17  CC & HPI:  Gail Fields is a 31 y.o. female presenting today for follow up after spontaneous miscarriage 9 days ago. She was seen on 11/20/16 in this office and had tissue removed. She states her bleeding has gradually improved since then.   ROS:  ROS +minimal vaginal bleeding  Pertinent History Reviewed:   Reviewed: Significant for  Medical         Past Medical History:  Diagnosis Date  . Acute cystitis   . BV (bacterial vaginosis)   . Hemorrhoid   . Hx of chlamydia infection   . Patellofemoral stress syndrome   . 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 Trichimoniasis   . Vaginal Pap smear, abnormal 09/22/14   ASCUS- +HPV  . Yeast infection                               Surgical Hx:    Past Surgical History:  Procedure Laterality Date  . NO PAST SURGERIES     Medications: Reviewed & Updated - see associated section                       Current Outpatient Prescriptions:  .  HYDROcodone-acetaminophen (NORCO/VICODIN) 5-325 MG tablet, Take 1 tablet by mouth every 6 (six) hours as needed. (Patient not taking: Reported on 11/29/2016), Disp: 30 tablet, Rfl: 0 .  metroNIDAZOLE (FLAGYL) 500 MG tablet, Take 500 mg by mouth 3 (three) times daily., Disp: , Rfl:  .  misoprostol (CYTOTEC) 200 MCG tablet, Take 4 po now (Patient not taking: Reported on 11/29/2016), Disp: 4 tablet, Rfl: 0 .  prenatal vitamin w/FE, FA (PRENATAL 1 + 1) 27-1 MG TABS tablet, Take 1 tablet by mouth daily at 12 noon. (Patient not taking: Reported on 11/29/2016), Disp: 30 each, Rfl: 12 .  PROCTO-MED HC 2.5 % rectal cream, APPLY  CREAM RECTALLY AS NEEDED FOR  HEMORRHOIDS  OR  ITCHING (Patient not taking: Reported on 11/20/2016), Disp: 30 g, Rfl: 3 No current facility-administered medications for this visit.   Facility-Administered Medications Ordered in Other Visits:  .  lidocaine 2 % w/epi 1:200,000 (20 ml)  with sod bicarb 8.4 % (2 ml) inj, , , Continuous PRN, Orlie Pollen, CRNA, 5 mL at 08/18/15 2327   Social History: Reviewed -  reports that she has never smoked. She has never used smokeless tobacco.  Objective Findings:  Vitals: Blood pressure 110/62, pulse 69, weight 260 lb 3.2 oz (118 kg), last menstrual period 09/25/2016, unknown if currently breastfeeding.  Physical Examination: Pelvic -  VULVA: normal appearing vulva with no masses, tenderness or lesions,  VAGINA: normal appearing vagina with normal color, no lesions, dark debris from miscarriage in vagina CERVIX: everted UTERUS: midplane  ADNEXA: normal adnexa in size, nontender and no masses   Assessment & Plan:   A:  1. Completed spontaneous AB 2. Contraceptive management 3. No evidence of yeast infection 4. Cervical eversion  P:  1. Start Lo Loestrin Fe 24 2. Take prenatal vitamins for 30 days 3. Follow up PRN or 1 year    By signing my name below, I, Sonum Patel, attest that this documentation has been prepared under the direction and in the presence of Tilda Burrow, MD. Electronically Signed:  Sonum Patel, Neurosurgeoncribe. 11/29/16. 9:21 AM.  I personally performed the services described in this documentation, which was SCRIBED in my presence. The recorded information has been reviewed and considered accurate. It has been edited as necessary during review. Tilda BurrowFERGUSON,Calla Wedekind V, MD

## 2016-12-05 ENCOUNTER — Telehealth: Payer: Self-pay | Admitting: Adult Health

## 2016-12-05 NOTE — Telephone Encounter (Signed)
Spoke with pt. Pt states she has an appt tomorrow. Advised to keep that appt. Pt voiced understanding. JSY

## 2016-12-06 ENCOUNTER — Ambulatory Visit (INDEPENDENT_AMBULATORY_CARE_PROVIDER_SITE_OTHER): Payer: BC Managed Care – PPO | Admitting: Obstetrics and Gynecology

## 2016-12-06 ENCOUNTER — Encounter: Payer: Self-pay | Admitting: Obstetrics and Gynecology

## 2016-12-06 DIAGNOSIS — N9419 Other specified dyspareunia: Secondary | ICD-10-CM | POA: Insufficient documentation

## 2016-12-06 DIAGNOSIS — N9089 Other specified noninflammatory disorders of vulva and perineum: Secondary | ICD-10-CM | POA: Diagnosis not present

## 2016-12-06 MED ORDER — NYSTATIN-TRIAMCINOLONE 100000-0.1 UNIT/GM-% EX OINT: 1.0000 "application " | TOPICAL_OINTMENT | Freq: Two times a day (BID) | CUTANEOUS | 99 refills | Status: DC

## 2016-12-06 NOTE — Progress Notes (Signed)
[facility] Visit  @DATE @            Patient name: Gail Fields MRN [id number]  Date of birth: 03/30/1986  CC & HPI:  Gail LamerLeslie Formisano is a 31 y.o. female presenting today for Vulvar irritation she is noticing a sensation of like a paper cut near the clitoral area, and a sense of irritation is worse at the end of the day and noted particularly with intercourse she had some of this sensation when last seen here for the spontaneous AB. At that time we could see no evidence of yeast infection. KOH and wet prep to be performed   ROS:  ROS SH single partner, no risk factors.  Pertinent History Reviewed:   Reviewed: Significant for recent miscarriage and irritation at ifrst posptop check. Medical         Past Medical History:  Diagnosis Date  . Acute cystitis   . BV (bacterial vaginosis)   . Hemorrhoid   . Hx of chlamydia infection   . Patellofemoral stress syndrome   . 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 Trichimoniasis   . Vaginal Pap smear, abnormal 09/22/14   ASCUS- +HPV  . Yeast infection                               Surgical Hx:    Past Surgical History:  Procedure Laterality Date  . NO PAST SURGERIES     Medications: Reviewed & Updated - see associated section                       Current Outpatient Prescriptions:  .  hydrocortisone (PROCTO-MED HC) 2.5 % rectal cream, APPLY  CREAM RECTALLY AS NEEDED FOR  HEMORRHOIDS  OR  ITCHING, Disp: 30 g, Rfl: 3 .  prenatal vitamin w/FE, FA (PRENATAL 1 + 1) 27-1 MG TABS tablet, Take 1 tablet by mouth daily at 12 noon., Disp: 30 each, Rfl: 12 .  HYDROcodone-acetaminophen (NORCO/VICODIN) 5-325 MG tablet, Take 1 tablet by mouth every 6 (six) hours as needed. (Patient not taking: Reported on 11/29/2016), Disp: 30 tablet, Rfl: 0 .  Norethindrone-Ethinyl Estradiol-Fe Biphas (LO LOESTRIN FE) 1 MG-10 MCG / 10 MCG tablet, Take 1 tablet by mouth daily. (Patient not taking: Reported on 12/06/2016), Disp: 84 tablet, Rfl: 3 No current  facility-administered medications for this visit.   Facility-Administered Medications Ordered in Other Visits:  .  lidocaine 2 % w/epi 1:200,000 (20 ml) with sod bicarb 8.4 % (2 ml) inj, , , Continuous PRN, Orlie Pollenebra R Merritt, CRNA, 5 mL at 08/18/15 2327   Social History: Reviewed -  reports that she has never smoked. She has never used smokeless tobacco.  Objective Findings:  Vitals: Blood pressure 100/60, pulse (!) 50, weight 159 lb 3.2 oz (72.2 kg), last menstrual period 09/25/2016, unknown if currently breastfeeding.  Physical Examination: General appearance - alert, well appearing, and in no distress, oriented to person, place, and time and normal appearing weight Mental status - alert, oriented to person, place, and time, normal mood, behavior, speech, dress, motor activity, and thought processes Abdomen - soft, nontender, nondistended, no masses or organomegaly Pelvic - VULVA: normal appearing vulva with no masses, tenderness or lesions, No distinct creases or lacerations noted, VAGINA: normal appearing vagina with normal color and discharge, no lesions, KOH and wet prep collected and reviewed. No yeast seen normal skin cells no Trichomonas or BV, CERVIX:  normal appearing cervix without discharge or lesions, generous cervical mucus   Assessment & Plan:   A:  1. Vulvar irritation 2. No visible abnormalities 3 dyspareunia 4. Cervical eversion with heavy cervical discharge bothering some to patient P:   1. Mycolog 2 intravaginal monistat pt has this at home

## 2016-12-18 ENCOUNTER — Ambulatory Visit: Payer: BC Managed Care – PPO | Admitting: Obstetrics and Gynecology

## 2017-03-07 ENCOUNTER — Encounter: Payer: Self-pay | Admitting: Women's Health

## 2017-03-07 ENCOUNTER — Ambulatory Visit (INDEPENDENT_AMBULATORY_CARE_PROVIDER_SITE_OTHER): Payer: BC Managed Care – PPO | Admitting: Women's Health

## 2017-03-07 VITALS — BP 110/58 | HR 84 | Ht 66.5 in | Wt 157.0 lb

## 2017-03-07 DIAGNOSIS — Z3201 Encounter for pregnancy test, result positive: Secondary | ICD-10-CM | POA: Diagnosis not present

## 2017-03-07 DIAGNOSIS — L292 Pruritus vulvae: Secondary | ICD-10-CM | POA: Diagnosis not present

## 2017-03-07 DIAGNOSIS — N912 Amenorrhea, unspecified: Secondary | ICD-10-CM | POA: Diagnosis not present

## 2017-03-07 DIAGNOSIS — Z3490 Encounter for supervision of normal pregnancy, unspecified, unspecified trimester: Secondary | ICD-10-CM

## 2017-03-07 DIAGNOSIS — Z3491 Encounter for supervision of normal pregnancy, unspecified, first trimester: Secondary | ICD-10-CM

## 2017-03-07 DIAGNOSIS — L290 Pruritus ani: Secondary | ICD-10-CM

## 2017-03-07 LAB — POCT WET PREP (WET MOUNT)
CLUE CELLS WET PREP WHIFF POC: NEGATIVE
TRICHOMONAS WET PREP HPF POC: ABSENT

## 2017-03-07 LAB — POCT URINE PREGNANCY: Preg Test, Ur: POSITIVE — AB

## 2017-03-07 MED ORDER — TERCONAZOLE 0.4 % VA CREA: 1.0000 | TOPICAL_CREAM | Freq: Every day | VAGINAL | 0 refills | Status: DC

## 2017-03-07 MED ORDER — PRENATAL PLUS 27-1 MG PO TABS: 1.0000 | ORAL_TABLET | Freq: Every day | ORAL | 12 refills | Status: DC

## 2017-03-07 NOTE — Patient Instructions (Addendum)
Preparation H Witch Hazel   Nausea & Vomiting  Have saltine crackers or pretzels by your bed and eat a few bites before you raise your head out of bed in the morning  Eat small frequent meals throughout the day instead of large meals  Drink plenty of fluids throughout the day to stay hydrated, just don't drink a lot of fluids with your meals.  This can make your stomach fill up faster making you feel sick  Do not brush your teeth right after you eat  Products with real ginger are good for nausea, like ginger ale and ginger hard candy Make sure it says made with real ginger!  Sucking on sour candy like lemon heads is also good for nausea  If your prenatal vitamins make you nauseated, take them at night so you will sleep through the nausea  Sea Bands  If you feel like you need medicine for the nausea & vomiting please let us know  If you are unable to keep any fluids or food down please let us know   Constipation  Drink plenty of fluid, preferably water, throughout the day  Eat foods high in fiber such as fruits, vegetables, and grains  Exercise, such as walking, is a good way to keep your bowels regular  Drink warm fluids, especially warm prune juice, or decaf coffee  Eat a 1/2 cup of real oatmeal (not instant), 1/2 cup applesauce, and 1/2-1 cup warm prune juice every day  If needed, you may take Colace (docusate sodium) stool softener once or twice a day to help keep the stool soft. If you are pregnant, wait until you are out of your first trimester (12-14 weeks of pregnancy)  If you still are having problems with constipation, you may take Miralax once daily as needed to help keep your bowels regular.  If you are pregnant, wait until you are out of your first trimester (12-14 weeks of pregnancy)   First Trimester of Pregnancy The first trimester of pregnancy is from week 1 until the end of week 12 (months 1 through 3). A week after a sperm fertilizes an egg, the egg will  implant on the wall of the uterus. This embryo will begin to develop into a baby. Genes from you and your partner are forming the baby. The female genes determine whether the baby is a boy or a girl. At 6-8 weeks, the eyes and face are formed, and the heartbeat can be seen on ultrasound. At the end of 12 weeks, all the baby's organs are formed.  Now that you are pregnant, you will want to do everything you can to have a healthy baby. Two of the most important things are to get good prenatal care and to follow your health care provider's instructions. Prenatal care is all the medical care you receive before the baby's birth. This care will help prevent, find, and treat any problems during the pregnancy and childbirth. BODY CHANGES Your body goes through many changes during pregnancy. The changes vary from woman to woman.   You may gain or lose a couple of pounds at first.  You may feel sick to your stomach (nauseous) and throw up (vomit). If the vomiting is uncontrollable, call your health care provider.  You may tire easily.  You may develop headaches that can be relieved by medicines approved by your health care provider.  You may urinate more often. Painful urination may mean you have a bladder infection.  You may develop heartburn  as a result of your pregnancy.  You may develop constipation because certain hormones are causing the muscles that push waste through your intestines to slow down.  You may develop hemorrhoids or swollen, bulging veins (varicose veins).  Your breasts may begin to grow larger and become tender. Your nipples may stick out more, and the tissue that surrounds them (areola) may become darker.  Your gums may bleed and may be sensitive to brushing and flossing.  Dark spots or blotches (chloasma, mask of pregnancy) may develop on your face. This will likely fade after the baby is born.  Your menstrual periods will stop.  You may have a loss of appetite.  You may  develop cravings for certain kinds of food.  You may have changes in your emotions from day to day, such as being excited to be pregnant or being concerned that something may go wrong with the pregnancy and baby.  You may have more vivid and strange dreams.  You may have changes in your hair. These can include thickening of your hair, rapid growth, and changes in texture. Some women also have hair loss during or after pregnancy, or hair that feels dry or thin. Your hair will most likely return to normal after your baby is born. WHAT TO EXPECT AT YOUR PRENATAL VISITS During a routine prenatal visit:  You will be weighed to make sure you and the baby are growing normally.  Your blood pressure will be taken.  Your abdomen will be measured to track your baby's growth.  The fetal heartbeat will be listened to starting around week 10 or 12 of your pregnancy.  Test results from any previous visits will be discussed. Your health care provider may ask you:  How you are feeling.  If you are feeling the baby move.  If you have had any abnormal symptoms, such as leaking fluid, bleeding, severe headaches, or abdominal cramping.  If you have any questions. Other tests that may be performed during your first trimester include:  Blood tests to find your blood type and to check for the presence of any previous infections. They will also be used to check for low iron levels (anemia) and Rh antibodies. Later in the pregnancy, blood tests for diabetes will be done along with other tests if problems develop.  Urine tests to check for infections, diabetes, or protein in the urine.  An ultrasound to confirm the proper growth and development of the baby.  An amniocentesis to check for possible genetic problems.  Fetal screens for spina bifida and Down syndrome.  You may need other tests to make sure you and the baby are doing well. HOME CARE INSTRUCTIONS  Medicines  Follow your health care  provider's instructions regarding medicine use. Specific medicines may be either safe or unsafe to take during pregnancy.  Take your prenatal vitamins as directed.  If you develop constipation, try taking a stool softener if your health care provider approves. Diet  Eat regular, well-balanced meals. Choose a variety of foods, such as meat or vegetable-based protein, fish, milk and low-fat dairy products, vegetables, fruits, and whole grain breads and cereals. Your health care provider will help you determine the amount of weight gain that is right for you.  Avoid raw meat and uncooked cheese. These carry germs that can cause birth defects in the baby.  Eating four or five small meals rather than three large meals a day may help relieve nausea and vomiting. If you start to feel nauseous,  eating a few soda crackers can be helpful. Drinking liquids between meals instead of during meals also seems to help nausea and vomiting.  If you develop constipation, eat more high-fiber foods, such as fresh vegetables or fruit and whole grains. Drink enough fluids to keep your urine clear or pale yellow. Activity and Exercise  Exercise only as directed by your health care provider. Exercising will help you:  Control your weight.  Stay in shape.  Be prepared for labor and delivery.  Experiencing pain or cramping in the lower abdomen or low back is a good sign that you should stop exercising. Check with your health care provider before continuing normal exercises.  Try to avoid standing for long periods of time. Move your legs often if you must stand in one place for a long time.  Avoid heavy lifting.  Wear low-heeled shoes, and practice good posture.  You may continue to have sex unless your health care provider directs you otherwise. Relief of Pain or Discomfort  Wear a good support bra for breast tenderness.   Take warm sitz baths to soothe any pain or discomfort caused by hemorrhoids. Use  hemorrhoid cream if your health care provider approves.   Rest with your legs elevated if you have leg cramps or low back pain.  If you develop varicose veins in your legs, wear support hose. Elevate your feet for 15 minutes, 3-4 times a day. Limit salt in your diet. Prenatal Care  Schedule your prenatal visits by the twelfth week of pregnancy. They are usually scheduled monthly at first, then more often in the last 2 months before delivery.  Write down your questions. Take them to your prenatal visits.  Keep all your prenatal visits as directed by your health care provider. Safety  Wear your seat belt at all times when driving.  Make a list of emergency phone numbers, including numbers for family, friends, the hospital, and police and fire departments. General Tips  Ask your health care provider for a referral to a local prenatal education class. Begin classes no later than at the beginning of month 6 of your pregnancy.  Ask for help if you have counseling or nutritional needs during pregnancy. Your health care provider can offer advice or refer you to specialists for help with various needs.  Do not use hot tubs, steam rooms, or saunas.  Do not douche or use tampons or scented sanitary pads.  Do not cross your legs for long periods of time.  Avoid cat litter boxes and soil used by cats. These carry germs that can cause birth defects in the baby and possibly loss of the fetus by miscarriage or stillbirth.  Avoid all smoking, herbs, alcohol, and medicines not prescribed by your health care provider. Chemicals in these affect the formation and growth of the baby.  Schedule a dentist appointment. At home, brush your teeth with a soft toothbrush and be gentle when you floss. SEEK MEDICAL CARE IF:   You have dizziness.  You have mild pelvic cramps, pelvic pressure, or nagging pain in the abdominal area.  You have persistent nausea, vomiting, or diarrhea.  You have a bad  smelling vaginal discharge.  You have pain with urination.  You notice increased swelling in your face, hands, legs, or ankles. SEEK IMMEDIATE MEDICAL CARE IF:   You have a fever.  You are leaking fluid from your vagina.  You have spotting or bleeding from your vagina.  You have severe abdominal cramping or pain.  You have rapid weight gain or loss.  You vomit blood or material that looks like coffee grounds.  You are exposed to [place] measles and have never had them.  You are exposed to fifth disease or chickenpox.  You develop a severe headache.  You have shortness of breath.  You have any kind of trauma, such as from a fall or a car accident. Document Released: 10/17/2001 Document Revised: 03/09/2014 Document Reviewed: 09/02/2013 [facility] Patient Information 2015 [place], [place]. This information is not intended to replace advice given to you by your health care provider. Make sure you discuss any questions you have with your health care provider.

## 2017-03-07 NOTE — Progress Notes (Signed)
   [facility] Visit  Patient name: Gail Fields MRN [id number]  Date of birth: 11-29-1985  CC & HPI:  Gail Fields is a 31 y.o. G68P1021 African American female presenting today for report of +HPT and vulovaginal and anal itching. Was rx'd mycolog and procto med hc cream 12/06/16 by [facility] for same. States hasn't helped. Denies odor. Went to pcp few weeks ago and was given diflucan which didn't help.  Uncertain of LMP, thinks 02/08/17. No n/v. Is not taking pnv, wants rx sent to her pharmacy so it will be free.  Patient's last menstrual period was 02/08/2017 (approximate).   Pertinent History Reviewed:  Medical & Surgical Hx:   Past medical, surgical, family, and social history reviewed in electronic medical record Medications: Reviewed & Updated - see associated section Allergies: Reviewed in electronic medical record  Objective Findings:  Vitals: BP (!) 110/58 (BP Location: Left Arm, Patient Position: Sitting, Cuff Size: Normal)   Pulse 84   Ht 5' 6.5" (1.689 m)   Wt 157 lb (71.2 kg)   LMP 02/08/2017 (Approximate)   Breastfeeding? No   BMI 24.96 kg/m  Body mass index is 24.96 kg/m.  Physical Examination: General appearance - alert, well appearing, and in no distress Pelvic - normal external genitalia, cx appears normal, mod amt white nonodorous d/c Small non-thrombosed external hemorrhoid  Results for orders placed or performed in visit on 03/07/17 (from the past 24 hour(s))  POCT urine pregnancy   Collection Time: 03/07/17 10:40 AM  Result Value Ref Range   Preg Test, Ur Positive (A) Negative  POCT Wet Prep [place])   Collection Time: 03/07/17 10:59 AM  Result Value Ref Range   Source Wet Prep POC vaginal    WBC, Wet Prep HPF POC few    Bacteria Wet Prep HPF POC None (A) Few   BACTERIA WET PREP MORPHOLOGY POC     Clue Cells Wet Prep HPF POC None None   Clue Cells Wet Prep Whiff POC Negative Whiff    Yeast Wet Prep HPF POC None    KOH Wet Prep POC     Trichomonas Wet Prep HPF POC Absent Absent     Assessment & Plan:  A:   [redacted]w[redacted]d pregnant by uncertain LMP  Vulvovaginal and anal itching, normal wet prep  P:  Can try yeast cream, pt wants rx so it is free, sent terazol 7- no sex while taking- if still itching let us know, will send vaginitis swab  Rx prenatal plus w/ 11RF  To begin procto med again for anal itching  Send gc/ct  Return in about 3 weeks (around 03/28/2017) for dating u/s, then 4wks for , intake w/ tish & new ob.   Had to explain everything to pt multiple times  Marge Duncans CNM, [facility] 03/07/2017 10:59 AM

## 2017-03-09 LAB — GC/CHLAMYDIA PROBE AMP
CHLAMYDIA, DNA PROBE: NEGATIVE
Neisseria gonorrhoeae by PCR: NEGATIVE

## 2017-03-19 ENCOUNTER — Telehealth: Payer: Self-pay | Admitting: Women's Health

## 2017-03-19 NOTE — Telephone Encounter (Signed)
Pt called stating that she would like to speak with a nurse or one of the mid-wife's, pt did not give details on the call. Please contact pt

## 2017-03-19 NOTE — Telephone Encounter (Signed)
LMOVM to return call.

## 2017-03-19 NOTE — Telephone Encounter (Signed)
Patient called with complaints of constipation. She has taken Colace, Miralax, eats salads and prunes daily along with a lot of water. Please advise.

## 2017-03-20 ENCOUNTER — Other Ambulatory Visit: Payer: BC Managed Care – PPO

## 2017-03-20 DIAGNOSIS — R300 Dysuria: Secondary | ICD-10-CM

## 2017-03-20 NOTE — Telephone Encounter (Signed)
Patient states she will come leave urine for culture today at 3pm.

## 2017-03-20 NOTE — Telephone Encounter (Signed)
Patient is taking Miralax daily but not Colace. Advised to take twice daily. She also states her urine is dark in color and has an odor. She is not having any discomfort with urination but does have history of UTI's. Can she come leave a urine for culture? Please advise.

## 2017-03-22 ENCOUNTER — Telehealth: Payer: Self-pay | Admitting: Women's Health

## 2017-03-22 MED ORDER — CEPHALEXIN 500 MG PO CAPS: 500.0000 mg | ORAL_CAPSULE | Freq: Four times a day (QID) | ORAL | 0 refills | Status: DC

## 2017-03-22 NOTE — Telephone Encounter (Signed)
Patient called with complaints of intermittent lower abdominal pain and burning with urination. She was frustrated that a urinalysis was not ran on her urine to get "faster" results along with the culture. I explained to patient that a urinalysis will show leukocytes or nitrates but depending on the bacteria grown on the culuture, can determine the antibiotic prescribed. Please review the culture and advise.

## 2017-03-22 NOTE — Telephone Encounter (Signed)
Informed patient prescription was sent pharmacy, unless she hears from us to continue to take Keflex as prescribed. Verbalized understanding.

## 2017-03-23 ENCOUNTER — Telehealth: Payer: Self-pay | Admitting: Adult Health

## 2017-03-23 LAB — URINE CULTURE

## 2017-03-23 NOTE — Telephone Encounter (Signed)
Patient still questioning if urinalysis was performed. I informed patient that urinalysis was not done but culture was more important in determining if growth is present and if so what type of antibiotic to use to treat.  Verbalized understanding.

## 2017-03-26 ENCOUNTER — Encounter: Payer: Self-pay | Admitting: Women's Health

## 2017-03-26 DIAGNOSIS — O2340 Unspecified infection of urinary tract in pregnancy, unspecified trimester: Secondary | ICD-10-CM | POA: Insufficient documentation

## 2017-03-28 ENCOUNTER — Encounter: Payer: Self-pay | Admitting: *Deleted

## 2017-03-28 ENCOUNTER — Ambulatory Visit (INDEPENDENT_AMBULATORY_CARE_PROVIDER_SITE_OTHER): Payer: BC Managed Care – PPO

## 2017-03-28 ENCOUNTER — Telehealth: Payer: Self-pay | Admitting: *Deleted

## 2017-03-28 DIAGNOSIS — Z3491 Encounter for supervision of normal pregnancy, unspecified, first trimester: Secondary | ICD-10-CM | POA: Diagnosis not present

## 2017-03-28 NOTE — Progress Notes (Signed)
US 6+6 wks,single IUP w/ys,pos fht 133 bpm,normal ovaries bilat,crl 10.6 mm,EDD 11/15/2017

## 2017-03-28 NOTE — Telephone Encounter (Signed)
Patient came into the office for her dating u/s but was upset that she was not seeing a provider as well. I informed patient that at the dating ultrasound visit, they do not see a provider until the next visit. Patient stated she just had some questions. I took patient into my office to answer several questions and concerns. She stated that the Keflex was giving her diarrhea and was hard for her to take 4 pills per day. States she has only been taking 2 or 3 since it is so harsh on her stomach. She also asked "do I have to take 500mg  for 7 days. Informed yes and she needed to take all the medication in order to clear up the bacteria. Advised to drink plenty of water, eat when taking the medicine and to try Immodium if needed for the diarrhea. We discussed to take in the am when she woke up, at lunch, mid afternoon and at bedtime in order to get all the medication in one day. Verbalized understanding.  She also complained of a yeast infection since starting the medication. I informed the patient that when taking antibiotics, it is not uncommon to get a yeast infection and she could take OTC Monistat. Verbalized understanding. She also stated she is now having nausea and wanted something for that as well. I spoke to Victorino DikeJennifer, NP who stated to give the patient some Diclegis samples. I gave the samples to the patient along with detailed instructions on how to take the medication. Verbalized understanding.

## 2017-04-17 ENCOUNTER — Encounter: Payer: Self-pay | Admitting: Women's Health

## 2017-04-17 ENCOUNTER — Ambulatory Visit: Payer: BC Managed Care – PPO | Admitting: *Deleted

## 2017-04-17 ENCOUNTER — Other Ambulatory Visit (HOSPITAL_COMMUNITY)
Admission: RE | Admit: 2017-04-17 | Discharge: 2017-04-17 | Disposition: A | Discharge status: Home / Self Care | Payer: BC Managed Care – PPO | Source: Ambulatory Visit | Attending: Obstetrics & Gynecology | Admitting: Obstetrics & Gynecology

## 2017-04-17 ENCOUNTER — Ambulatory Visit (INDEPENDENT_AMBULATORY_CARE_PROVIDER_SITE_OTHER): Payer: BC Managed Care – PPO | Admitting: Women's Health

## 2017-04-17 VITALS — BP 110/68 | HR 70 | Wt 164.0 lb

## 2017-04-17 DIAGNOSIS — Z1389 Encounter for screening for other disorder: Secondary | ICD-10-CM

## 2017-04-17 DIAGNOSIS — Z124 Encounter for screening for malignant neoplasm of cervix: Secondary | ICD-10-CM

## 2017-04-17 DIAGNOSIS — Z3481 Encounter for supervision of other normal pregnancy, first trimester: Secondary | ICD-10-CM

## 2017-04-17 DIAGNOSIS — Z331 Pregnant state, incidental: Secondary | ICD-10-CM

## 2017-04-17 DIAGNOSIS — Z3A09 9 weeks gestation of pregnancy: Secondary | ICD-10-CM

## 2017-04-17 DIAGNOSIS — Z3490 Encounter for supervision of normal pregnancy, unspecified, unspecified trimester: Secondary | ICD-10-CM | POA: Insufficient documentation

## 2017-04-17 DIAGNOSIS — Z3682 Encounter for antenatal screening for nuchal translucency: Secondary | ICD-10-CM

## 2017-04-17 LAB — POCT URINALYSIS DIPSTICK
Blood, UA: NEGATIVE
GLUCOSE UA: NEGATIVE
Ketones, UA: NEGATIVE
LEUKOCYTES UA: NEGATIVE
NITRITE UA: NEGATIVE
PROTEIN UA: NEGATIVE

## 2017-04-17 MED ORDER — HYDROCORTISONE ACE-PRAMOXINE 1-1 % RE FOAM: 1.0000 | Freq: Two times a day (BID) | RECTAL | 0 refills | Status: DC

## 2017-04-17 NOTE — Progress Notes (Signed)
Subjective:  Gail Fields is a 31 y.o. 534P1021 African American female at 7873w5d by LMP c/w 7wk u/s, being seen today for her first obstetrical visit.  Her obstetrical history is significant for EAB x 1, term VAVB x 1, sab x 1.  Pregnancy history fully reviewed.  Patient reports mild nausea- declines meds; hemorrhoid- has tried everything, used proctofoam in past which helped- requests rx. Denies vb, cramping, uti s/s, abnormal/malodorous vag d/c, or vulvovaginal itching/irritation.  BP 110/68   Pulse 70   Wt 164 lb (74.4 kg)   LMP 02/08/2017 (Approximate)   BMI 26.07 kg/m   HISTORY: OB History  Gravida Para Term Preterm AB Living  4 1 1   2 1   SAB TAB Ectopic Multiple Live Births  1 1   0 1    # Outcome Date GA Lbr Len/2nd Weight Sex Delivery Anes PTL Lv  4 Current           3 SAB 11/20/16          2 Term 08/19/15 3277w3d 11:55 / 04:36 7 lb 9.5 oz (3.445 kg) M Vag-Vacuum EPI  LIV  1 TAB 2011             Past Medical History:  Diagnosis Date  . Acute cystitis   . BV (bacterial vaginosis)   . Hemorrhoid   . Hx of chlamydia infection   . Patellofemoral stress syndrome   . 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 Trichimoniasis   . Vaginal Pap smear, abnormal 09/22/14   ASCUS- +HPV  . Yeast infection    Past Surgical History:  Procedure Laterality Date  . NO PAST SURGERIES     Family History  Problem Relation Age of Onset  . Cancer Maternal Grandmother        breast  . Seizures Paternal Grandmother     Exam   System:     General: Well developed & nourished, no acute distress   Skin: Warm & dry, normal coloration and turgor, no rashes   Neurologic: Alert & oriented, normal mood   Cardiovascular: Regular rate & rhythm   Respiratory: Effort & rate normal, LCTAB, acyanotic   Abdomen: Soft, non tender   Extremities: normal strength, tone   Pelvic Exam:    Perineum: Normal perineum   Vulva: Normal, no lesions   Vagina:  Normal mucosa, normal discharge   Cervix: Normal, bulbous,  appears closed   Uterus: Normal size/shape/contour for GA   Rectal:  Large redundant skin/non-thrombosed hemorrhoid   Thin prep pap smear obtained w/ high risk HPV cotesting FHR: + via informal u/s, w/ active fetus   Assessment:   Pregnancy: [id number] Patient Active Problem List   Diagnosis Date Noted  . Atypical squamous cell changes of undetermined significance (ASCUS) on cervical cytology with positive high risk human papilloma virus (HPV) 01/20/2015    Priority: High  . Supervision of normal pregnancy 04/17/2017  . UTI (urinary tract infection) during pregnancy, first trimester 03/26/2017  . Pregnant 03/07/2017  . Dyspareunia due to medical condition in female 12/06/2016  . Complete miscarriage 11/20/2016  . Polyp at cervical os 10/13/2014  . Patellofemoral stress syndrome 10/13/2014    673w5d G4P1021 New OB visit Mild nausea Hemorrhoid H/O abnormal pap  Plan:  Initial labs obtained Continue prenatal vitamins Problem list reviewed and updated Reviewed n/v relief measures and warning s/s to report Reviewed recommended weight gain based on pre-gravid BMI Encouraged well-balanced diet Genetic Screening discussed Integrated Screen: requested Cystic fibrosis  screening discussed neg prev preg Ultrasound discussed; fetal survey: requested Follow up in 3 weeks for 1st IT/NT and visit [facility]NC completed, faxed to [place] [facility] Rx proctofoam for hemorrhoids per request  Marge Duncans CNM, [facility] 04/17/2017 11:28 AM

## 2017-04-17 NOTE — Patient Instructions (Signed)

## 2017-04-18 LAB — URINALYSIS, ROUTINE W REFLEX MICROSCOPIC
Bilirubin, UA: NEGATIVE
Glucose, UA: NEGATIVE
KETONES UA: NEGATIVE
LEUKOCYTES UA: NEGATIVE
Nitrite, UA: NEGATIVE
Protein, UA: NEGATIVE
RBC, UA: NEGATIVE
SPEC GRAV UA: 1.023 (ref 1.005–1.030)
Urobilinogen, Ur: 1 mg/dL (ref 0.2–1.0)
pH, UA: 7 (ref 5.0–7.5)

## 2017-04-18 LAB — PMP SCREEN PROFILE (10S), URINE
AMPHETAMINE SCREEN URINE: NEGATIVE ng/mL
BARBITURATE SCREEN URINE: NEGATIVE ng/mL
BENZODIAZEPINE SCREEN, URINE: NEGATIVE ng/mL
CANNABINOIDS UR QL SCN: NEGATIVE ng/mL
CREATININE(CRT), U: 171.7 mg/dL (ref 20.0–300.0)
Cocaine (Metab) Scrn, Ur: NEGATIVE ng/mL
METHADONE SCREEN, URINE: NEGATIVE ng/mL
OXYCODONE+OXYMORPHONE UR QL SCN: NEGATIVE ng/mL
Opiate Scrn, Ur: NEGATIVE ng/mL
PH UR, DRUG SCRN: 6.8 (ref 4.5–8.9)
PROPOXYPHENE SCREEN URINE: NEGATIVE ng/mL
Phencyclidine Qn, Ur: NEGATIVE ng/mL

## 2017-04-18 LAB — CBC
HEMOGLOBIN: 12.9 g/dL (ref 11.1–15.9)
Hematocrit: 38.6 % (ref 34.0–46.6)
MCH: 30 pg (ref 26.6–33.0)
MCHC: 33.4 g/dL (ref 31.5–35.7)
MCV: 90 fL (ref 79–97)
Platelets: 243 10*3/uL (ref 150–379)
RBC: 4.3 x10E6/uL (ref 3.77–5.28)
RDW: 14.8 % (ref 12.3–15.4)
WBC: 8.4 10*3/uL (ref 3.4–10.8)

## 2017-04-18 LAB — ABO/RH: RH TYPE: POSITIVE

## 2017-04-18 LAB — HEPATITIS B SURFACE ANTIGEN: HEP B S AG: NEGATIVE

## 2017-04-18 LAB — VARICELLA ZOSTER ANTIBODY, IGG: VARICELLA: 375 {index} (ref >165)

## 2017-04-18 LAB — ANTIBODY SCREEN: ANTIBODY SCREEN: NEGATIVE

## 2017-04-18 LAB — RPR: RPR Ser Ql: NONREACTIVE

## 2017-04-18 LAB — HIV ANTIBODY (ROUTINE TESTING W REFLEX): HIV Screen 4th Generation wRfx: NONREACTIVE

## 2017-04-18 LAB — RUBELLA SCREEN: Rubella Antibodies, IGG: 2.54 index (ref >0.99)

## 2017-04-19 LAB — CYTOLOGY - PAP
Diagnosis: NEGATIVE
HPV (WINDOPATH): DETECTED — AB

## 2017-04-19 LAB — URINE CULTURE

## 2017-04-20 ENCOUNTER — Encounter: Payer: Self-pay | Admitting: Women's Health

## 2017-04-25 ENCOUNTER — Encounter: Payer: Self-pay | Admitting: Women's Health

## 2017-05-08 ENCOUNTER — Encounter: Payer: Self-pay | Admitting: Obstetrics & Gynecology

## 2017-05-08 ENCOUNTER — Ambulatory Visit (INDEPENDENT_AMBULATORY_CARE_PROVIDER_SITE_OTHER): Payer: BC Managed Care – PPO | Admitting: Obstetrics & Gynecology

## 2017-05-08 ENCOUNTER — Ambulatory Visit (INDEPENDENT_AMBULATORY_CARE_PROVIDER_SITE_OTHER): Payer: BC Managed Care – PPO

## 2017-05-08 VITALS — BP 104/58 | HR 75 | Wt 164.0 lb

## 2017-05-08 DIAGNOSIS — Z1389 Encounter for screening for other disorder: Secondary | ICD-10-CM

## 2017-05-08 DIAGNOSIS — Z3682 Encounter for antenatal screening for nuchal translucency: Secondary | ICD-10-CM

## 2017-05-08 DIAGNOSIS — Z3481 Encounter for supervision of other normal pregnancy, first trimester: Secondary | ICD-10-CM

## 2017-05-08 DIAGNOSIS — Z3401 Encounter for supervision of normal first pregnancy, first trimester: Secondary | ICD-10-CM

## 2017-05-08 DIAGNOSIS — Z3A12 12 weeks gestation of pregnancy: Secondary | ICD-10-CM

## 2017-05-08 DIAGNOSIS — Z331 Pregnant state, incidental: Secondary | ICD-10-CM

## 2017-05-08 NOTE — Progress Notes (Signed)
US 12+5 wks,measurements c/w dates,normal right ovary,left ovary not visualized,NB present,NT 1.3 mm,fhr 154 bpm,crl 69.73 mm,post pl gr 0

## 2017-05-08 NOTE — Progress Notes (Signed)
[id number] 643w5d Estimated Date of Delivery: 11/15/17  Blood pressure (!) 104/58, pulse 75, weight 164 lb (74.4 kg), last menstrual period 02/08/2017, not currently breastfeeding.   BP weight and urine results all reviewed and noted.  Please refer to the obstetrical flow sheet for the fundal height and fetal heart rate documentation:  Patient reports good fetal movement, denies any bleeding and no rupture of membranes symptoms or regular contractions. Patient is without complaints. All questions were answered.  Orders Placed This Encounter  Procedures  . Maternal Screen, Integrated #1  . POCT urinalysis dipstick    Plan:  Continued routine obstetrical care, NT sonogram is normal, see report  Return in about 4 weeks (around 06/05/2017) for 2nd IT.

## 2017-05-19 LAB — MATERNAL SCREEN, INTEGRATED #1
Crown Rump Length: 69.7 mm
Gest. Age on Collection Date: 13 weeks
Maternal Age at EDD: 32.5 yr
Nuchal Translucency (NT): 1.3 mm
Number of Fetuses: 1
PAPP-A VALUE: 1768.5 ng/mL
Weight: 164 [lb_av]

## 2017-06-05 ENCOUNTER — Telehealth: Payer: Self-pay | Admitting: Women's Health

## 2017-06-06 ENCOUNTER — Encounter: Payer: Self-pay | Admitting: Obstetrics and Gynecology

## 2017-06-06 ENCOUNTER — Ambulatory Visit (INDEPENDENT_AMBULATORY_CARE_PROVIDER_SITE_OTHER): Payer: BC Managed Care – PPO | Admitting: Obstetrics and Gynecology

## 2017-06-06 VITALS — BP 102/62 | HR 76 | Wt 165.5 lb

## 2017-06-06 DIAGNOSIS — Z3A16 16 weeks gestation of pregnancy: Secondary | ICD-10-CM

## 2017-06-06 DIAGNOSIS — Z8759 Personal history of other complications of pregnancy, childbirth and the puerperium: Secondary | ICD-10-CM

## 2017-06-06 DIAGNOSIS — Z331 Pregnant state, incidental: Secondary | ICD-10-CM

## 2017-06-06 DIAGNOSIS — O09893 Supervision of other high risk pregnancies, third trimester: Secondary | ICD-10-CM

## 2017-06-06 DIAGNOSIS — Z1379 Encounter for other screening for genetic and chromosomal anomalies: Secondary | ICD-10-CM

## 2017-06-06 DIAGNOSIS — Z1389 Encounter for screening for other disorder: Secondary | ICD-10-CM

## 2017-06-06 DIAGNOSIS — Z3482 Encounter for supervision of other normal pregnancy, second trimester: Secondary | ICD-10-CM

## 2017-06-06 LAB — POCT URINALYSIS DIPSTICK
Blood, UA: NEGATIVE
Ketones, UA: NEGATIVE
Nitrite, UA: NEGATIVE
Protein, UA: NEGATIVE

## 2017-06-06 NOTE — Progress Notes (Signed)
Patient ID: Gail LamerLeslie Fields, female   DOB: [birth year], 31 y.o.   MRN: [id number]  Estimated Date of Delivery: 11/15/17 LROB 6019w6d  Chief Complaint  Patient presents with  . Routine Prenatal Visit    2nd IT  ____  Patient complaints: no complaints at this time. She notes that she had a miscarriage at 7 weeks approximately 8 months ago. Denies any other symptoms. Patient reports  good fetal movement, denies any bleeding , rupture of membranes,or regular contractions.  Blood pressure 102/62, pulse 76, weight 165 lb 8 oz (75.1 kg), last menstrual period 02/08/2017, not currently breastfeeding.   Urine results:notable for 2+ Leukocytes, otherwise negative refer to the ob flow sheet for FH and FHR, ,                          Physical Examination: General appearance - alert, well appearing, and in no distress                                      Abdomen -FHR 140                                            Questions were answered. Assessment: LROB [id number] @ 1119w6d   Plan:  Continued routine obstetrical care  F/u in 4 weeks for LROB and US Anatomy scan  By signing my name below, I, Diona BrownerJennifer Gorman, attest that this documentation has been prepared under the direction and in the presence of Tilda BurrowFerguson, Kinsler Soeder V, MD. Electronically Signed: Diona BrownerJennifer Gorman, Medical Scribe. 06/06/17. 12:02 PM.  I personally performed the services described in this documentation, which was SCRIBED in my presence. The recorded information has been reviewed and considered accurate. It has been edited as necessary during review. Tilda BurrowFERGUSON,Saphyre Cillo V, MD

## 2017-06-10 LAB — MATERNAL SCREEN, INTEGRATED #2
ADSF: 1.19
AFP MOM: 0.87
Alpha-Fetoprotein: 29.4 ng/mL
Crown Rump Length: 69.7 mm
DIA MoM: 1.24
DIA VALUE: 192.3 pg/mL
Estriol, Unconjugated: 1.14 ng/mL
Gest. Age on Collection Date: 13 weeks
Gestational Age: 17.1 weeks
HCG VALUE: 17.2 [IU]/mL
MATERNAL AGE AT EDD: 32.5 a
Nuchal Translucency (NT): 1.3 mm
Nuchal Translucency MoM: 0.75
Number of Fetuses: 1
PAPP-A MOM: 1.75
PAPP-A VALUE: 1768.5 ng/mL
TEST RESULTS: NEGATIVE
WEIGHT: 166 [lb_av]
Weight: 164 [lb_av]
hCG MoM: 0.67

## 2017-06-14 MED ORDER — HYDROCORTISONE ACE-PRAMOXINE 1-1 % RE FOAM: 1.0000 | Freq: Two times a day (BID) | RECTAL | 0 refills | Status: DC

## 2017-07-03 ENCOUNTER — Other Ambulatory Visit: Payer: Self-pay | Admitting: Obstetrics and Gynecology

## 2017-07-03 DIAGNOSIS — Z363 Encounter for antenatal screening for malformations: Secondary | ICD-10-CM

## 2017-07-04 ENCOUNTER — Encounter: Payer: BC Managed Care – PPO | Admitting: Obstetrics and Gynecology

## 2017-07-04 ENCOUNTER — Other Ambulatory Visit: Payer: BC Managed Care – PPO

## 2017-07-04 ENCOUNTER — Ambulatory Visit (INDEPENDENT_AMBULATORY_CARE_PROVIDER_SITE_OTHER): Payer: BC Managed Care – PPO

## 2017-07-04 ENCOUNTER — Ambulatory Visit (INDEPENDENT_AMBULATORY_CARE_PROVIDER_SITE_OTHER): Payer: BC Managed Care – PPO | Admitting: Obstetrics and Gynecology

## 2017-07-04 ENCOUNTER — Encounter: Payer: Self-pay | Admitting: Obstetrics and Gynecology

## 2017-07-04 VITALS — BP 108/78 | HR 76 | Wt 168.8 lb

## 2017-07-04 DIAGNOSIS — Z363 Encounter for antenatal screening for malformations: Secondary | ICD-10-CM

## 2017-07-04 DIAGNOSIS — Z1389 Encounter for screening for other disorder: Secondary | ICD-10-CM

## 2017-07-04 DIAGNOSIS — Z3482 Encounter for supervision of other normal pregnancy, second trimester: Secondary | ICD-10-CM

## 2017-07-04 DIAGNOSIS — Z3402 Encounter for supervision of normal first pregnancy, second trimester: Secondary | ICD-10-CM

## 2017-07-04 DIAGNOSIS — Z331 Pregnant state, incidental: Secondary | ICD-10-CM

## 2017-07-04 DIAGNOSIS — Z3A20 20 weeks gestation of pregnancy: Secondary | ICD-10-CM

## 2017-07-04 LAB — POCT URINALYSIS DIPSTICK
GLUCOSE UA: NEGATIVE
Ketones, UA: NEGATIVE
Nitrite, UA: NEGATIVE
Protein, UA: NEGATIVE
RBC UA: NEGATIVE

## 2017-07-04 NOTE — Progress Notes (Signed)
US 20+6 wks,cephalic,cx 3.7 cm,post pl gr 0,normal ovaries bilat,svp of fluid 6.4 cm,fhr 141 bpm,efw 402 g,anatomy complete,no obvious abnormalities

## 2017-07-04 NOTE — Progress Notes (Signed)
[id number]  [facility]: [facility]  Chief Complaint  Patient presents with  . Routine Prenatal Visit    anatomy scan  ____  Patient complaints: No complaints. Patient reports good fetal movement, denies any bleeding , rupture of membranes,or regular contractions.  Blood pressure 108/78, pulse 76, weight 168 lb 12.8 oz (76.6 kg), last menstrual period 02/08/2017, not currently breastfeeding.   Urine results:notable for + Leukocytes, otherwise negative refer to the ob flow sheet for FH and FHR, ,                          Physical Examination: General appearance - alert, well appearing, and in no distress and oriented to person, place, and time                                      Abdomen - FH 20 cm,                                                         -FHR 159                                                                                                    Questions were answered. Assessment: LROB [id number] @ 4237w6d   Plan:  Continued routine obstetrical care,   F/u in 4 weeks for LROB  By signing my name below, I, Diona BrownerJennifer Gorman, attest that this documentation has been prepared under the direction and in the presence of Tilda BurrowFerguson, Othel Hoogendoorn V, MD. Electronically Signed: Diona BrownerJennifer Gorman, Medical Scribe. 07/04/17. 11:36 AM.  I personally performed the services described in this documentation, which was SCRIBED in my presence. The recorded information has been reviewed and considered accurate. It has been edited as necessary during review. Tilda BurrowFERGUSON,Bonnetta Allbee V, MD

## 2017-07-04 NOTE — Patient Instructions (Signed)
Please check out [email or web address]   for more information on childbirth classes  ° °[phone] is the phone number for Pregnancy Classes or hospital tours at [facility].  ° °You will be referred to  [email or web address]   for more information on childbirth classes   °At this site you may register for classes. You may sign up for a waiting list if classes are full. Please SIGN UP FOR THIS!.   When the waiting list becomes long, sometimes new classes can be added. ° ° ° ° ° °

## 2017-08-01 ENCOUNTER — Encounter: Payer: BC Managed Care – PPO | Admitting: Advanced Practice Midwife

## 2017-08-03 ENCOUNTER — Ambulatory Visit (INDEPENDENT_AMBULATORY_CARE_PROVIDER_SITE_OTHER): Payer: BC Managed Care – PPO | Admitting: Obstetrics and Gynecology

## 2017-08-03 ENCOUNTER — Encounter: Payer: Self-pay | Admitting: Obstetrics and Gynecology

## 2017-08-03 VITALS — BP 100/50 | HR 76 | Wt 174.6 lb

## 2017-08-03 DIAGNOSIS — Z331 Pregnant state, incidental: Secondary | ICD-10-CM

## 2017-08-03 DIAGNOSIS — Z3403 Encounter for supervision of normal first pregnancy, third trimester: Secondary | ICD-10-CM

## 2017-08-03 DIAGNOSIS — Z1389 Encounter for screening for other disorder: Secondary | ICD-10-CM

## 2017-08-03 DIAGNOSIS — Z3482 Encounter for supervision of other normal pregnancy, second trimester: Secondary | ICD-10-CM

## 2017-08-03 DIAGNOSIS — Z3A25 25 weeks gestation of pregnancy: Secondary | ICD-10-CM

## 2017-08-03 LAB — POCT URINALYSIS DIPSTICK
GLUCOSE UA: NEGATIVE
Ketones, UA: NEGATIVE
Leukocytes, UA: NEGATIVE
Nitrite, UA: NEGATIVE
Protein, UA: NEGATIVE
RBC UA: NEGATIVE

## 2017-08-03 NOTE — Progress Notes (Signed)
Patient ID: Gail Fields, female   DOB: [birth year], 31 y.o.   MRN: [id number] [redacted]w[redacted]d Estimated Date of Delivery: 11/15/17 LROB  Patient reports good fetal movement, denies any bleeding and no rupture of membranes symptoms or regular contractions. Patient complaints: no complaints at this time. She notes that she is considering either the nexplanon or a BTL following this pregnancy. She states that this is her second pregnancy.   Blood pressure (!) 100/50, pulse 76, weight 174 lb 9.6 oz (79.2 kg), last menstrual period 02/08/2017, not currently breastfeeding. refer to the ob flow sheet for FH and FHR, also BP, Wt, Urine results:notable for negative                          Physical Examination:  General appearance - alert, well appearing, and in no distress Abdomen - FH 27 cm                   -FHR 126 soft, nontender, nondistended, no masses or organomegaly   The provider spent over 15 minutes with the visit, including pre visit review, documentation, with >than 50% spent in counseling and coordination of care.                                            Questions were answered. Assessment: LROB [id number] @ [redacted]w[redacted]d  Plan:  Continued routine obstetrical care  F/u in 2 weeks for PN2  By signing my name below, I, Soijett Blue, attest that this documentation has been prepared under the direction and in the presence of Tilda Burrow, MD Electronically Signed: Soijett Blue, ED Scribe. 08/03/17. 11:17 AM.  I personally performed the services described in this documentation, which was SCRIBED in my presence. The recorded information has been reviewed and considered accurate. It has been edited as necessary during review. Tilda Burrow, MD

## 2017-08-05 ENCOUNTER — Inpatient Hospital Stay (HOSPITAL_COMMUNITY)
Admission: AD | Admit: 2017-08-05 | Discharge: 2017-08-05 | Disposition: A | Discharge status: Home / Self Care | Payer: BC Managed Care – PPO | Source: Ambulatory Visit | Attending: Family Medicine | Admitting: Family Medicine

## 2017-08-05 ENCOUNTER — Encounter (HOSPITAL_COMMUNITY): Payer: Self-pay

## 2017-08-05 DIAGNOSIS — O26892 Other specified pregnancy related conditions, second trimester: Secondary | ICD-10-CM | POA: Insufficient documentation

## 2017-08-05 DIAGNOSIS — L298 Other pruritus: Secondary | ICD-10-CM | POA: Diagnosis not present

## 2017-08-05 DIAGNOSIS — N898 Other specified noninflammatory disorders of vagina: Secondary | ICD-10-CM

## 2017-08-05 DIAGNOSIS — O2240 Hemorrhoids in pregnancy, unspecified trimester: Secondary | ICD-10-CM | POA: Diagnosis present

## 2017-08-05 DIAGNOSIS — O2242 Hemorrhoids in pregnancy, second trimester: Secondary | ICD-10-CM

## 2017-08-05 DIAGNOSIS — Z3A25 25 weeks gestation of pregnancy: Secondary | ICD-10-CM | POA: Insufficient documentation

## 2017-08-05 LAB — WET PREP, GENITAL
Clue Cells Wet Prep HPF POC: NONE SEEN
Sperm: NONE SEEN
TRICH WET PREP: NONE SEEN
YEAST WET PREP: NONE SEEN

## 2017-08-05 MED ORDER — LIDOCAINE 5 % EX OINT: 1.0000 "application " | TOPICAL_OINTMENT | CUTANEOUS | 0 refills | Status: DC | PRN

## 2017-08-05 MED ORDER — OXYCODONE-ACETAMINOPHEN 5-325 MG PO TABS: 1.0000 | ORAL_TABLET | Freq: Four times a day (QID) | ORAL | 0 refills | Status: DC | PRN

## 2017-08-05 MED ORDER — LIDOCAINE HCL 2 % EX GEL
1.0000 | CUTANEOUS | 0 refills | Status: DC | PRN
Start: 2017-08-05 — End: 2017-09-18

## 2017-08-05 MED ORDER — LIDOCAINE HCL 2 % EX GEL
1.0000 "application " | Freq: Once | CUTANEOUS | Status: AC
  Administered 2017-08-05: 1 via TOPICAL
  Filled 2017-08-05: qty 5

## 2017-08-05 MED ORDER — LIDOCAINE HCL 2 % EX GEL: 1.0000 "application " | CUTANEOUS | 0 refills | Status: DC | PRN

## 2017-08-05 MED ORDER — LIDOCAINE 5 % EX OINT
1.0000 | TOPICAL_OINTMENT | CUTANEOUS | 0 refills | Status: DC | PRN
Start: 2017-08-05 — End: 2017-08-05

## 2017-08-05 NOTE — MAU Note (Signed)
Patient presents with c/o hemorrhoids x 2 days, has tried witch hazel, prep H, tucks, not helping.

## 2017-08-05 NOTE — MAU Provider Note (Signed)
Chief Complaint:  Hemorrhoids   First Provider Initiated Contact with Patient 08/05/17 1316      HPI: Gail Fields is a 31 y.o. G4P1021 at [redacted]w[redacted]d who presents to maternity admissions reporting painful hemorrhoids making it difficult to sit and keeping her from sleeping. She also has itching but is not sure if it is vaginal itching or from the hemorrhoid.  She reports constipation, but is having daily bowel movements that are hard and uncomfortable.  She has taken Colace and Miralax in the past but is not taking these now.  She has tried Proctofoam, witch hazel, and Tylenol for hemorrhoid pain but they are not helping. There are no associated symptoms. She reports good fetal movement, denies abdominal pain, LOF, vaginal bleeding, vaginal itching/burning, urinary symptoms, h/a, dizziness, n/v, or fever/chills.    HPI  Past Medical History: Past Medical History:  Diagnosis Date  . Acute cystitis   . BV (bacterial vaginosis)   . Hemorrhoid   . Hx of chlamydia infection   . Patellofemoral stress syndrome   . 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 Trichimoniasis   . Vaginal Pap smear, abnormal 09/22/14   ASCUS- +HPV  . Yeast infection     Past obstetric history: OB History  Gravida Para Term Preterm AB Living  SAB TAB Ectopic Multiple Live Births  1 1   0 1    # Outcome Date GA Lbr Len/2nd Weight Sex Delivery Anes PTL Lv  4 Current           3 SAB 11/20/16          2 Term 08/19/15 [redacted]w[redacted]d 11:55 / 04:36 7 lb 9.5 oz (3.445 kg) M Vag-Vacuum EPI  LIV  1 TAB 2011              Past Surgical History: Past Surgical History:  Procedure Laterality Date  . NO PAST SURGERIES      Family History: Family History  Problem Relation Age of Onset  . Cancer Maternal Grandmother        breast  . Seizures Paternal Grandmother     Social History: Social History  Substance Use Topics  . Smoking status: Never Smoker  . Smokeless tobacco: Never Used  . Alcohol use No    Allergies: No  Known Allergies  Meds:  No prescriptions prior to admission.    ROS:  Review of Systems  Constitutional: Negative for chills, fatigue and fever.  Eyes: Negative for visual disturbance.  Respiratory: Negative for shortness of breath.   Cardiovascular: Negative for chest pain.  Gastrointestinal: Positive for constipation and rectal pain. Negative for abdominal pain, nausea and vomiting.  Genitourinary: Positive for vaginal discharge. Negative for difficulty urinating, dysuria, flank pain, pelvic pain and vaginal bleeding.  Neurological: Negative for dizziness and headaches.  Psychiatric/Behavioral: Negative.      I have reviewed patient's Past Medical Hx, Surgical Hx, Family Hx, Social Hx, medications and allergies.   Physical Exam  BP 104/61 (BP Location: Left Arm)   Pulse 92   Temp 98 F (36.7 C) (Oral)   Resp 16   LMP 02/08/2017 (Approximate)    Constitutional: Well-developed, well-nourished female in no acute distress.  Cardiovascular: normal rate Respiratory: normal effort GI: Abd soft, non-tender, gravid appropriate for gestational age.  MS: Extremities nontender, no edema, normal ROM Neurologic: Alert and oriented x 4.  GU: Neg CVAT.  PELVIC EXAM: Cervix pink, visually closed, without lesion, scant white creamy  discharge, vaginal walls and external genitalia normal  Visible external hemorrhoid, 1 cm in diameter, skin taut and edemetous but no evidence of incarceration      FHT:  Baseline 140 , moderate variability, accelerations present, no decelerations Contractions: None on toco or to palpation   Labs: No results found for this or any previous visit (from the past 24 hour(s)). A/Positive/-- (06/12 1154)  Imaging:  No results found.  MAU Course/MDM: NST reviewed and reactive Hemorrhoid causing pt pain, no infection noted on wet prep, GCC pending  Topical lidocaine given in MAU, pt requested stronger version so Rx for both lidocaine jelly 3 % and lidocaine  ointment 5 % sent to pharmacy Rx for Percocet 5/325, take 1-2 Q 6 hours PRN x 6 tabs to use sparingly Treat constipation with diet changes, Colace and Miralax  F/U in office as scheduled, or sooner if symptoms persist  Pt stable at time of discharge.  Assessment: 1. Hemorrhoids during pregnancy in second trimester   2. Itching in the vaginal area     Plan: Discharge home Labor precautions and fetal kick counts Follow-up Information    [facility] Follow up.   Why:  As scheduled, or call if symptoms persist. Return to MAU as needed for emergencies. Contact information: [address] [place] [address] [phone]         Allergies as of 08/05/2017   No Known Allergies     Medication List    TAKE these medications   acetaminophen 500 MG tablet Commonly known as:  TYLENOL Take 500 mg by mouth every 6 (six) hours as needed for moderate pain.   calcium carbonate 500 MG chewable tablet Commonly known as:  TUMS - dosed in mg elemental calcium Chew 1 tablet by mouth 2 (two) times daily as needed for indigestion or heartburn.   hydrocortisone-pramoxine rectal foam Commonly known as:  PROCTOFOAM HC Place 1 applicator rectally 2 (two) times daily.   lidocaine 2 % jelly Commonly known as:  XYLOCAINE Apply 1 application topically as needed.   lidocaine 5 % ointment Commonly known as:  XYLOCAINE Apply 1 application topically as needed.   oxyCODONE-acetaminophen 5-325 MG tablet Commonly known as:  PERCOCET/ROXICET Take 1-2 tablets by mouth every 6 (six) hours as needed for severe pain.   prenatal vitamin w/FE, FA 27-1 MG Tabs tablet Take 1 tablet by mouth daily at 12 noon.       Sharen Counter Certified Nurse-Midwife 08/06/2017 8:13 PM

## 2017-08-06 LAB — GC/CHLAMYDIA PROBE AMP (~~LOC~~) NOT AT ARMC
CHLAMYDIA, DNA PROBE: NEGATIVE
Neisseria Gonorrhea: NEGATIVE

## 2017-08-20 ENCOUNTER — Other Ambulatory Visit: Payer: BC Managed Care – PPO

## 2017-08-20 ENCOUNTER — Ambulatory Visit (INDEPENDENT_AMBULATORY_CARE_PROVIDER_SITE_OTHER): Payer: BC Managed Care – PPO | Admitting: Obstetrics & Gynecology

## 2017-08-20 ENCOUNTER — Encounter: Payer: Self-pay | Admitting: Obstetrics & Gynecology

## 2017-08-20 VITALS — BP 110/62 | HR 83 | Wt 178.0 lb

## 2017-08-20 DIAGNOSIS — Z3A27 27 weeks gestation of pregnancy: Secondary | ICD-10-CM

## 2017-08-20 DIAGNOSIS — Z1389 Encounter for screening for other disorder: Secondary | ICD-10-CM

## 2017-08-20 DIAGNOSIS — Z3403 Encounter for supervision of normal first pregnancy, third trimester: Secondary | ICD-10-CM

## 2017-08-20 DIAGNOSIS — Z3482 Encounter for supervision of other normal pregnancy, second trimester: Secondary | ICD-10-CM

## 2017-08-20 DIAGNOSIS — Z131 Encounter for screening for diabetes mellitus: Secondary | ICD-10-CM

## 2017-08-20 DIAGNOSIS — Z331 Pregnant state, incidental: Secondary | ICD-10-CM

## 2017-08-20 LAB — POCT URINALYSIS DIPSTICK
Glucose, UA: NEGATIVE
KETONES UA: NEGATIVE
Leukocytes, UA: NEGATIVE
Nitrite, UA: NEGATIVE
PROTEIN UA: NEGATIVE
RBC UA: NEGATIVE

## 2017-08-20 NOTE — Progress Notes (Signed)
[id number] [redacted]w[redacted]d Estimated Date of Delivery: 11/15/17  Blood pressure 110/62, pulse 83, weight 178 lb (80.7 kg), last menstrual period 02/08/2017.   BP weight and urine results all reviewed and noted.  Please refer to the obstetrical flow sheet for the fundal height and fetal heart rate documentation:  Patient reports good fetal movement, denies any bleeding and no rupture of membranes symptoms or regular contractions. Patient is without complaints. All questions were answered.  Orders Placed This Encounter  Procedures  . POCT urinalysis dipstick    Plan:  Continued routine obstetrical care, PN2, FHR 146  Return in about 3 weeks (around 09/10/2017) for LROB.

## 2017-08-21 LAB — CBC
HEMOGLOBIN: 11.2 g/dL (ref 11.1–15.9)
Hematocrit: 33.7 % — ABNORMAL LOW (ref 34.0–46.6)
MCH: 31 pg (ref 26.6–33.0)
MCHC: 33.2 g/dL (ref 31.5–35.7)
MCV: 93 fL (ref 79–97)
Platelets: 180 10*3/uL (ref 150–379)
RBC: 3.61 x10E6/uL — AB (ref 3.77–5.28)
RDW: 13.3 % (ref 12.3–15.4)
WBC: 8.1 10*3/uL (ref 3.4–10.8)

## 2017-08-21 LAB — HIV ANTIBODY (ROUTINE TESTING W REFLEX): HIV SCREEN 4TH GENERATION: NONREACTIVE

## 2017-08-21 LAB — ANTIBODY SCREEN: ANTIBODY SCREEN: NEGATIVE

## 2017-08-21 LAB — GLUCOSE TOLERANCE, 2 HOURS W/ 1HR
GLUCOSE, 1 HOUR: 83 mg/dL (ref 65–179)
GLUCOSE, FASTING: 83 mg/dL (ref 65–91)
Glucose, 2 hour: 79 mg/dL (ref 65–152)

## 2017-08-21 LAB — RPR: RPR: NONREACTIVE

## 2017-09-04 ENCOUNTER — Telehealth: Payer: Self-pay | Admitting: Obstetrics and Gynecology

## 2017-09-04 NOTE — Telephone Encounter (Signed)
Patient called stating she had some light spotting this morning which has now resolved but has been cramping since along with more pressure. [facility]. Pt hesitate but verbalized understanding.

## 2017-09-10 ENCOUNTER — Encounter: Payer: BC Managed Care – PPO | Admitting: Women's Health

## 2017-09-13 ENCOUNTER — Ambulatory Visit (INDEPENDENT_AMBULATORY_CARE_PROVIDER_SITE_OTHER): Payer: BC Managed Care – PPO | Admitting: Obstetrics & Gynecology

## 2017-09-13 ENCOUNTER — Encounter: Payer: Self-pay | Admitting: Obstetrics & Gynecology

## 2017-09-13 VITALS — BP 108/58 | HR 88 | Wt 182.0 lb

## 2017-09-13 DIAGNOSIS — Z3A31 31 weeks gestation of pregnancy: Secondary | ICD-10-CM

## 2017-09-13 DIAGNOSIS — Z331 Pregnant state, incidental: Secondary | ICD-10-CM

## 2017-09-13 DIAGNOSIS — Z3483 Encounter for supervision of other normal pregnancy, third trimester: Secondary | ICD-10-CM

## 2017-09-13 DIAGNOSIS — Z1389 Encounter for screening for other disorder: Secondary | ICD-10-CM

## 2017-09-13 LAB — POCT URINALYSIS DIPSTICK
Blood, UA: NEGATIVE
GLUCOSE UA: NEGATIVE
KETONES UA: NEGATIVE
NITRITE UA: NEGATIVE

## 2017-09-13 NOTE — Progress Notes (Signed)
[id number] 8491w0d Estimated Date of Delivery: 11/15/17  Blood pressure (!) 108/58, pulse 88, weight 182 lb (82.6 kg), last menstrual period 02/08/2017.   BP weight and urine results all reviewed and noted.  Please refer to the obstetrical flow sheet for the fundal height and fetal heart rate documentation:  Patient reports good fetal movement, denies any bleeding and no rupture of membranes symptoms or regular contractions. Patient is without complaints. All questions were answered.  Orders Placed This Encounter  Procedures  . POCT urinalysis dipstick    Plan:  Continued routine obstetrical care,   Return in about 2 weeks (around 09/27/2017) for LROB.

## 2017-09-17 ENCOUNTER — Telehealth: Payer: Self-pay | Admitting: *Deleted

## 2017-09-17 ENCOUNTER — Other Ambulatory Visit: Payer: Self-pay | Admitting: Advanced Practice Midwife

## 2017-09-18 ENCOUNTER — Telehealth: Payer: Self-pay | Admitting: Obstetrics & Gynecology

## 2017-09-18 MED ORDER — LIDOCAINE HCL 2 % EX GEL: 1.0000 "application " | CUTANEOUS | 11 refills | Status: DC | PRN

## 2017-09-18 MED ORDER — PRENATAL PLUS 27-1 MG PO TABS: 1.0000 | ORAL_TABLET | Freq: Every day | ORAL | 12 refills | Status: DC

## 2017-09-18 MED ORDER — LIDOCAINE 5 % EX OINT: 1.0000 "application " | TOPICAL_OINTMENT | CUTANEOUS | 11 refills | Status: DC | PRN

## 2017-09-18 NOTE — Telephone Encounter (Signed)
done

## 2017-09-19 ENCOUNTER — Ambulatory Visit (INDEPENDENT_AMBULATORY_CARE_PROVIDER_SITE_OTHER): Payer: BC Managed Care – PPO | Admitting: Women's Health

## 2017-09-19 ENCOUNTER — Encounter: Payer: Self-pay | Admitting: Women's Health

## 2017-09-19 ENCOUNTER — Telehealth: Payer: Self-pay | Admitting: *Deleted

## 2017-09-19 VITALS — BP 110/50 | HR 80 | Wt 185.0 lb

## 2017-09-19 DIAGNOSIS — N9089 Other specified noninflammatory disorders of vulva and perineum: Secondary | ICD-10-CM | POA: Diagnosis not present

## 2017-09-19 DIAGNOSIS — O09893 Supervision of other high risk pregnancies, third trimester: Secondary | ICD-10-CM

## 2017-09-19 DIAGNOSIS — R300 Dysuria: Secondary | ICD-10-CM

## 2017-09-19 DIAGNOSIS — Z3483 Encounter for supervision of other normal pregnancy, third trimester: Secondary | ICD-10-CM

## 2017-09-19 DIAGNOSIS — Z1389 Encounter for screening for other disorder: Secondary | ICD-10-CM

## 2017-09-19 DIAGNOSIS — O2343 Unspecified infection of urinary tract in pregnancy, third trimester: Secondary | ICD-10-CM

## 2017-09-19 DIAGNOSIS — Z331 Pregnant state, incidental: Secondary | ICD-10-CM

## 2017-09-19 DIAGNOSIS — Z3A31 31 weeks gestation of pregnancy: Secondary | ICD-10-CM

## 2017-09-19 LAB — POCT URINALYSIS DIPSTICK
Blood, UA: NEGATIVE
GLUCOSE UA: NEGATIVE
Nitrite, UA: NEGATIVE
Protein, UA: NEGATIVE

## 2017-09-19 LAB — POCT WET PREP (WET MOUNT)
CLUE CELLS WET PREP WHIFF POC: NEGATIVE
Trichomonas Wet Prep HPF POC: ABSENT

## 2017-09-19 MED ORDER — NITROFURANTOIN MONOHYD MACRO 100 MG PO CAPS: 100.0000 mg | ORAL_CAPSULE | Freq: Two times a day (BID) | ORAL | 0 refills | Status: DC

## 2017-09-19 NOTE — Progress Notes (Signed)
Work-in LOW-RISK PREGNANCY VISIT Patient name: Gail Fields MRN [id number]  Date of birth: Jul 19, 1986 Chief Complaint:   Urinary Tract Infection  History of Present Illness:   Gail Fields is a 31 y.o. [id number] female at 3118w6d with an Estimated Date of Delivery: 11/15/17 being seen today for ongoing management of a low-risk pregnancy, work-in.  Today she reports bad odor to urine for about 2 wks, increased urinary frequency and dysuria x 2 days, also burning w/ urination and right after. Denies fever/chills, back/flank pain. Also feels like she has a cut around clitoris, used monistat cream on it and made it worse. Contractions: Not present. Vag. Bleeding: None.  Movement: Present. denies leaking of fluid. Review of Systems:   Pertinent items are noted in HPI Denies abnormal vaginal discharge w/ itching/odor/irritation, headaches, visual changes, shortness of breath, chest pain, abdominal pain, severe nausea/vomiting, or problems with bowel movements unless otherwise stated above. Pertinent History Reviewed:  Reviewed past medical,surgical, social, obstetrical and family history.  Reviewed problem list, medications and allergies. Physical Assessment:   Vitals:   09/19/17 1450  BP: (!) 110/50  Pulse: 80  Weight: 185 lb (83.9 kg)  Body mass index is 29.41 kg/m.        Physical Examination:   General appearance: Well appearing, and in no distress  Mental status: Alert, oriented to person, place, and time  Skin: Warm & dry  Cardiovascular: Normal heart rate noted  Respiratory: Normal respiratory effort, no distress  Abdomen: Soft, gravid, nontender  Pelvic: Vulva: erythematous, no cut/fissure seen around clitoris. Spec exam: cx visually closed, small amt white nonodorous d/c.          Rectal: non-thrombosed hemorrhoids  Extremities: Edema: None  Fetal Status: Fetal Heart Rate (bpm): 145 Fundal Height: 29 cm Movement: Present    Results for orders placed or performed in visit on  09/19/17 (from the past 24 hour(s))  POCT Urinalysis Dipstick   Collection Time: 09/19/17  2:55 PM  Result Value Ref Range   Color, UA     Clarity, UA     Glucose, UA neg    Bilirubin, UA     Ketones, UA trace    Spec Grav, UA  1.010 - 1.025   Blood, UA neg    pH, UA  5.0 - 8.0   Protein, UA neg    Urobilinogen, UA  0.2 or 1.0 E.U./dL   Nitrite, UA neg    Leukocytes, UA Small (1+) (A) Negative  POCT Wet Prep Mellody Drown([facility] [place])   Collection Time: 09/19/17  3:16 PM  Result Value Ref Range   Source Wet Prep POC vaginal    WBC, Wet Prep HPF POC mod    Bacteria Wet Prep HPF POC None (A) Few   BACTERIA WET PREP MORPHOLOGY POC     Clue Cells Wet Prep HPF POC None None   Clue Cells Wet Prep Whiff POC Negative Whiff    Yeast Wet Prep HPF POC None    KOH Wet Prep POC     Trichomonas Wet Prep HPF POC Absent Absent    Assessment & Plan:  1) Low-risk pregnancy [id number] at 5118w6d with an Estimated Date of Delivery: 11/15/17   2) Presumed UTI, based on sx, rx macrobid, send urine cx  3) Vulvar irritation> will send gc/ct  ] Plan:  Continue routine obstetrical care   Reviewed: Preterm labor symptoms and general obstetric precautions including but not limited to vaginal bleeding, contractions, leaking of fluid and fetal movement were  reviewed in detail with the patient.  All questions were answered  Follow-up: Return for As scheduled.  Orders Placed This Encounter  Procedures  . Urine Culture  . GC/Chlamydia Probe Amp  . POCT Urinalysis Dipstick  . POCT [facility] ([address])   Marge DuncansBooker, Kimberly Randall CNM, [facility]NP-BC 09/19/2017 3:21 PM

## 2017-09-19 NOTE — Telephone Encounter (Signed)
Pt called after visit, macrobid on back order at pharmacy she wanted sent to, [facility] [place] rx sent.  Cheral MarkerKimberly R. Janashia Parco, CNM, [facility]NP-BC 09/19/2017 5:12 PM

## 2017-09-19 NOTE — Patient Instructions (Signed)
Gail LamerLeslie Fields, I greatly value your feedback.  If you receive a survey following your visit with Gail Fields today, we appreciate you taking the time to fill it out.  Thanks, Joellyn HaffKim Deina Lipsey, CNM, WHNP-BC   Call the office [phone]) or go to [facility] if:  You begin to have strong, frequent contractions  Your water breaks.  Sometimes it is a big gush of fluid, sometimes it is just a trickle that keeps getting your panties wet or running down your legs  You have vaginal bleeding.  It is normal to have a small amount of spotting if your cervix was checked.   You don't feel your baby moving like normal.  If you don't, get you something to eat and drink and lay down and focus on feeling your baby move.  You should feel at least 10 movements in 2 hours.  If you don't, you should call the office or go to [facility].    Tdap Vaccine  It is recommended that you get the Tdap vaccine during the third trimester of EACH pregnancy to help protect your baby from getting pertussis (whooping cough)  27-36 weeks is the BEST time to do this so that you can pass the protection on to your baby. During pregnancy is better than after pregnancy, but if you are unable to get it during pregnancy it will be offered at the hospital.   You can get this vaccine at the health department or your family doctor  Everyone who will be around your baby should also be up-to-date on their vaccines. Adults (who are not pregnant) only need 1 dose of Tdap during adulthood.   Third Trimester of Pregnancy The third trimester is from week 29 through week 42, months 7 through 9. The third trimester is a time when the fetus is growing rapidly. At the end of the ninth month, the fetus is about 20 inches in length and weighs 6-10 pounds.  BODY CHANGES Your body goes through many changes during pregnancy. The changes vary from woman to woman.   Your weight will continue to increase. You can expect to gain 25-35 pounds (11-16 kg) by the  end of the pregnancy.  You may begin to get stretch marks on your hips, abdomen, and breasts.  You may urinate more often because the fetus is moving lower into your pelvis and pressing on your bladder.  You may develop or continue to have heartburn as a result of your pregnancy.  You may develop constipation because certain hormones are causing the muscles that push waste through your intestines to slow down.  You may develop hemorrhoids or swollen, bulging veins (varicose veins).  You may have pelvic pain because of the weight gain and pregnancy hormones relaxing your joints between the bones in your pelvis. Backaches may result from overexertion of the muscles supporting your posture.  You may have changes in your hair. These can include thickening of your hair, rapid growth, and changes in texture. Some women also have hair loss during or after pregnancy, or hair that feels dry or thin. Your hair will most likely return to normal after your baby is born.  Your breasts will continue to grow and be tender. A yellow discharge may leak from your breasts called colostrum.  Your belly button may stick out.  You may feel short of breath because of your expanding uterus.  You may notice the fetus "dropping," or moving lower in your abdomen.  You may have a bloody mucus  discharge. This usually occurs a few days to a week before labor begins.  Your cervix becomes thin and soft (effaced) near your due date. WHAT TO EXPECT AT YOUR PRENATAL EXAMS  You will have prenatal exams every 2 weeks until week 36. Then, you will have weekly prenatal exams. During a routine prenatal visit:  You will be weighed to make sure you and the fetus are growing normally.  Your blood pressure is taken.  Your abdomen will be measured to track your baby's growth.  The fetal heartbeat will be listened to.  Any test results from the previous visit will be discussed.  You may have a cervical check near your due  date to see if you have effaced. At around 36 weeks, your caregiver will check your cervix. At the same time, your caregiver will also perform a test on the secretions of the vaginal tissue. This test is to determine if a type of bacteria, Group B streptococcus, is present. Your caregiver will explain this further. Your caregiver may ask you:  What your birth plan is.  How you are feeling.  If you are feeling the baby move.  If you have had any abnormal symptoms, such as leaking fluid, bleeding, severe headaches, or abdominal cramping.  If you have any questions. Other tests or screenings that may be performed during your third trimester include:  Blood tests that check for low iron levels (anemia).  Fetal testing to check the health, activity level, and growth of the fetus. Testing is done if you have certain medical conditions or if there are problems during the pregnancy. FALSE LABOR You may feel small, irregular contractions that eventually go away. These are called Braxton Hicks contractions, or false labor. Contractions may last for hours, days, or even weeks before true labor sets in. If contractions come at regular intervals, intensify, or become painful, it is best to be seen by your caregiver.  SIGNS OF LABOR   Menstrual-like cramps.  Contractions that are 5 minutes apart or less.  Contractions that start on the top of the uterus and spread down to the lower abdomen and back.  A sense of increased pelvic pressure or back pain.  A watery or bloody mucus discharge that comes from the vagina. If you have any of these signs before the 37th week of pregnancy, call your caregiver right away. You need to go to the hospital to get checked immediately. HOME CARE INSTRUCTIONS   Avoid all smoking, herbs, alcohol, and unprescribed drugs. These chemicals affect the formation and growth of the baby.  Follow your caregiver's instructions regarding medicine use. There are medicines that  are either safe or unsafe to take during pregnancy.  Exercise only as directed by your caregiver. Experiencing uterine cramps is a good sign to stop exercising.  Continue to eat regular, healthy meals.  Wear a good support bra for breast tenderness.  Do not use hot tubs, steam rooms, or saunas.  Wear your seat belt at all times when driving.  Avoid raw meat, uncooked cheese, cat litter boxes, and soil used by cats. These carry germs that can cause birth defects in the baby.  Take your prenatal vitamins.  Try taking a stool softener (if your caregiver approves) if you develop constipation. Eat more high-fiber foods, such as fresh vegetables or fruit and whole grains. Drink plenty of fluids to keep your urine clear or pale yellow.  Take warm sitz baths to soothe any pain or discomfort caused by hemorrhoids. Use  hemorrhoid cream if your caregiver approves.  If you develop varicose veins, wear support hose. Elevate your feet for 15 minutes, 3-4 times a day. Limit salt in your diet.  Avoid heavy lifting, wear low heal shoes, and practice good posture.  Rest a lot with your legs elevated if you have leg cramps or low back pain.  Visit your dentist if you have not gone during your pregnancy. Use a soft toothbrush to brush your teeth and be gentle when you floss.  A sexual relationship may be continued unless your caregiver directs you otherwise.  Do not travel far distances unless it is absolutely necessary and only with the approval of your caregiver.  Take prenatal classes to understand, practice, and ask questions about the labor and delivery.  Make a trial run to the hospital.  Pack your hospital bag.  Prepare the baby's nursery.  Continue to go to all your prenatal visits as directed by your caregiver. SEEK MEDICAL CARE IF:  You are unsure if you are in labor or if your water has broken.  You have dizziness.  You have mild pelvic cramps, pelvic pressure, or nagging pain  in your abdominal area.  You have persistent nausea, vomiting, or diarrhea.  You have a bad smelling vaginal discharge.  You have pain with urination. SEEK IMMEDIATE MEDICAL CARE IF:   You have a fever.  You are leaking fluid from your vagina.  You have spotting or bleeding from your vagina.  You have severe abdominal cramping or pain.  You have rapid weight loss or gain.  You have shortness of breath with chest pain.  You notice sudden or extreme swelling of your face, hands, ankles, feet, or legs.  You have not felt your baby move in over an hour.  You have severe headaches that do not go away with medicine.  You have vision changes. Document Released: 10/17/2001 Document Revised: 10/28/2013 Document Reviewed: 12/24/2012 [facility] Patient Information 2015 [place], [place]. This information is not intended to replace advice given to you by your health care provider. Make sure you discuss any questions you have with your health care provider.

## 2017-09-20 LAB — GC/CHLAMYDIA PROBE AMP
Chlamydia trachomatis, NAA: NEGATIVE
Neisseria gonorrhoeae by PCR: NEGATIVE

## 2017-09-21 LAB — URINE CULTURE

## 2017-09-24 ENCOUNTER — Encounter: Payer: Self-pay | Admitting: Women's Health

## 2017-09-26 ENCOUNTER — Encounter: Payer: BC Managed Care – PPO | Admitting: Advanced Practice Midwife

## 2017-10-02 ENCOUNTER — Encounter: Payer: Self-pay | Admitting: Women's Health

## 2017-10-02 ENCOUNTER — Ambulatory Visit (INDEPENDENT_AMBULATORY_CARE_PROVIDER_SITE_OTHER): Payer: BC Managed Care – PPO | Admitting: Women's Health

## 2017-10-02 VITALS — BP 100/50 | HR 98 | Wt 186.2 lb

## 2017-10-02 DIAGNOSIS — Z3A33 33 weeks gestation of pregnancy: Secondary | ICD-10-CM

## 2017-10-02 DIAGNOSIS — Z1389 Encounter for screening for other disorder: Secondary | ICD-10-CM

## 2017-10-02 DIAGNOSIS — Z3483 Encounter for supervision of other normal pregnancy, third trimester: Secondary | ICD-10-CM

## 2017-10-02 DIAGNOSIS — Z331 Pregnant state, incidental: Secondary | ICD-10-CM

## 2017-10-02 DIAGNOSIS — O2343 Unspecified infection of urinary tract in pregnancy, third trimester: Secondary | ICD-10-CM

## 2017-10-02 LAB — POCT URINALYSIS DIPSTICK
Glucose, UA: NEGATIVE
KETONES UA: NEGATIVE
NITRITE UA: NEGATIVE
Protein, UA: NEGATIVE
RBC UA: NEGATIVE

## 2017-10-02 MED ORDER — PANTOPRAZOLE SODIUM 20 MG PO TBEC: 20.0000 mg | DELAYED_RELEASE_TABLET | Freq: Every day | ORAL | 3 refills | Status: DC

## 2017-10-02 MED ORDER — LIDOCAINE 5 % EX OINT: 1.0000 "application " | TOPICAL_OINTMENT | CUTANEOUS | 11 refills | Status: DC | PRN

## 2017-10-02 NOTE — Patient Instructions (Addendum)
Gail Fields, I greatly value your feedback.  If you receive a survey following your visit with us today, we appreciate you taking the time to fill it out.  Thanks, Joellyn HaffKim Mickael Mcnutt, CNM, WHNP-BC   Call the office [phone]) or go to [facility] if:  You begin to have strong, frequent contractions  Your water breaks.  Sometimes it is a big gush of fluid, sometimes it is just a trickle that keeps getting your panties wet or running down your legs  You have vaginal bleeding.  It is normal to have a small amount of spotting if your cervix was checked.   You don't feel your baby moving like normal.  If you don't, get you something to eat and drink and lay down and focus on feeling your baby move.  You should feel at least 10 movements in 2 hours.  If you don't, you should call the office or go to [facility].   Tdap Vaccine  It is recommended that you get the Tdap vaccine during the third trimester of EACH pregnancy to help protect your baby from getting pertussis (whooping cough)  27-36 weeks is the BEST time to do this so that you can pass the protection on to your baby. During pregnancy is better than after pregnancy, but if you are unable to get it during pregnancy it will be offered at the hospital.   You can get this vaccine at the health department or your family doctor  Everyone who will be around your baby should also be up-to-date on their vaccines. Adults (who are not pregnant) only need 1 dose of Tdap during adulthood.        Preterm Labor and Birth Information The normal length of a pregnancy is 39-41 weeks. Preterm labor is when labor starts before 37 completed weeks of pregnancy. What are the risk factors for preterm labor? Preterm labor is more likely to occur in women who:  Have certain infections during pregnancy such as a bladder infection, sexually transmitted infection, or infection inside the uterus (chorioamnionitis).  Have a shorter-than-normal  cervix.  Have gone into preterm labor before.  Have had surgery on their cervix.  Are younger than age 32 or older than age 32.  Are African American.  Are pregnant with twins or multiple babies (multiple gestation).  Take street drugs or smoke while pregnant.  Do not gain enough weight while pregnant.  Became pregnant shortly after having been pregnant.  What are the symptoms of preterm labor? Symptoms of preterm labor include:  Cramps similar to those that can happen during a menstrual period. The cramps may happen with diarrhea.  Pain in the abdomen or lower back.  Regular uterine contractions that may feel like tightening of the abdomen.  A feeling of increased pressure in the pelvis.  Increased watery or bloody mucus discharge from the vagina.  Water breaking (ruptured amniotic sac).  Why is it important to recognize signs of preterm labor? It is important to recognize signs of preterm labor because babies who are born prematurely may not be fully developed. This can put them at an increased risk for:  Long-term (chronic) heart and lung problems.  Difficulty immediately after birth with regulating body systems, including blood sugar, body temperature, heart rate, and breathing rate.  Bleeding in the brain.  Cerebral palsy.  Learning difficulties.  Death.  These risks are highest for babies who are born before 34 weeks of pregnancy. How is preterm labor treated? Treatment depends on the  length of your pregnancy, your condition, and the health of your baby. It may involve:  Having a stitch (suture) placed in your cervix to prevent your cervix from opening too early (cerclage).  Taking or being given medicines, such as: ? Hormone medicines. These may be given early in pregnancy to help support the pregnancy. ? Medicine to stop contractions. ? Medicines to help mature the baby's lungs. These may be prescribed if the risk of delivery is high. ? Medicines to  prevent your baby from developing cerebral palsy.  If the labor happens before 34 weeks of pregnancy, you may need to stay in the hospital. What should I do if I think I am in preterm labor? If you think that you are going into preterm labor, call your health care provider right away. How can I prevent preterm labor in future pregnancies? To increase your chance of having a full-term pregnancy:  Do not use any tobacco products, such as cigarettes, chewing tobacco, and e-cigarettes. If you need help quitting, ask your health care provider.  Do not use street drugs or medicines that have not been prescribed to you during your pregnancy.  Talk with your health care provider before taking any herbal supplements, even if you have been taking them regularly.  Make sure you gain a healthy amount of weight during your pregnancy.  Watch for infection. If you think that you might have an infection, get it checked right away.  Make sure to tell your health care provider if you have gone into preterm labor before.  This information is not intended to replace advice given to you by your health care provider. Make sure you discuss any questions you have with your health care provider. Document Released: 01/13/2004 Document Revised: 04/04/2016 Document Reviewed: 03/15/2016 Elsevier Interactive Patient Education  2018 [employer]

## 2017-10-02 NOTE — Progress Notes (Signed)
   LOW-RISK PREGNANCY VISIT Patient name: Gail LamerLeslie Ludtke MRN [id number]  Date of birth: [birth year] Chief Complaint:   No chief complaint on file.  History of Present Illness:   Gail LamerLeslie Barsky is a 31 y.o. 445 [id number] female at 5195w5d with an Estimated Date of Delivery: 11/15/17 being seen today for ongoing management of a low-risk pregnancy.  Today she reports no complaints. Finished all antibiotics for UTI, sx gone.  Contractions: Not present.  .  Movement: Present. denies leaking of fluid. Review of Systems:   Pertinent items are noted in HPI Denies abnormal vaginal discharge w/ itching/odor/irritation, headaches, visual changes, shortness of breath, chest pain, abdominal pain, severe nausea/vomiting, or problems with urination or bowel movements unless otherwise stated above. Pertinent History Reviewed:  Reviewed past medical,surgical, social, obstetrical and family history.  Reviewed problem list, medications and allergies. Physical Assessment:   Vitals:   10/02/17 1205  BP: (!) 100/50  Pulse: 98  Weight: 186 lb 3.2 oz (84.5 kg)  Body mass index is 29.6 kg/m.        Physical Examination:   General appearance: Well appearing, and in no distress  Mental status: Alert, oriented to person, place, and time  Skin: Warm & dry  Cardiovascular: Normal heart rate noted  Respiratory: Normal respiratory effort, no distress  Abdomen: Soft, gravid, nontender  Pelvic: Cervical exam deferred         Extremities: Edema: None  Fetal Status: Fetal Heart Rate (bpm): 150 Fundal Height: 33 cm Movement: Present    Results for orders placed or performed in visit on 10/02/17 (from the past 24 hour(s))  POCT urinalysis dipstick   Collection Time: 10/02/17 12:18 PM  Result Value Ref Range   Color, UA     Clarity, UA     Glucose, UA neg    Bilirubin, UA     Ketones, UA neg    Spec Grav, UA  1.010 - 1.025   Blood, UA neg    pH, UA  5.0 - 8.0   Protein, UA neg    Urobilinogen, UA  0.2 or 1.0 E.U./dL   Nitrite, UA neg    Leukocytes, UA Moderate (2+) (A) Negative    Assessment & Plan:  1) Low-risk pregnancy [id number] at 2995w5d with an Estimated Date of Delivery: 11/15/17   2) Recent UTI, send urine cx poc today   Labs/procedures today: urine cx  Plan:  Continue routine obstetrical care   Reviewed: Preterm labor symptoms and general obstetric precautions including but not limited to vaginal bleeding, contractions, leaking of fluid and fetal movement were reviewed in detail with the patient. Recommended Tdap at HD/PCP per CDC guidelines.   All questions were answered  Follow-up: Return in about 2 weeks (around 10/16/2017) for LROB.  Orders Placed This Encounter  Procedures  . Urine Culture  . POCT urinalysis dipstick   Marge DuncansBooker, Colden Samaras Randall CNM, [facility] 10/02/2017 12:35 PM

## 2017-10-04 LAB — URINE CULTURE: Organism ID, Bacteria: NO GROWTH

## 2017-10-16 ENCOUNTER — Encounter: Payer: BC Managed Care – PPO | Admitting: Obstetrics & Gynecology

## 2017-10-17 ENCOUNTER — Ambulatory Visit (INDEPENDENT_AMBULATORY_CARE_PROVIDER_SITE_OTHER): Payer: BC Managed Care – PPO | Admitting: Advanced Practice Midwife

## 2017-10-17 ENCOUNTER — Telehealth: Payer: Self-pay | Admitting: Advanced Practice Midwife

## 2017-10-17 ENCOUNTER — Encounter: Payer: Self-pay | Admitting: Advanced Practice Midwife

## 2017-10-17 VITALS — BP 106/60 | HR 83 | Temp 98.1°F | Wt 189.0 lb

## 2017-10-17 DIAGNOSIS — J020 Streptococcal pharyngitis: Secondary | ICD-10-CM

## 2017-10-17 DIAGNOSIS — Z3A35 35 weeks gestation of pregnancy: Secondary | ICD-10-CM

## 2017-10-17 DIAGNOSIS — Z1389 Encounter for screening for other disorder: Secondary | ICD-10-CM

## 2017-10-17 DIAGNOSIS — Z3483 Encounter for supervision of other normal pregnancy, third trimester: Secondary | ICD-10-CM

## 2017-10-17 DIAGNOSIS — Z331 Pregnant state, incidental: Secondary | ICD-10-CM

## 2017-10-17 LAB — POCT URINALYSIS DIPSTICK
GLUCOSE UA: NEGATIVE
KETONES UA: NEGATIVE
Nitrite, UA: NEGATIVE
RBC UA: NEGATIVE

## 2017-10-17 MED ORDER — FLUTICASONE PROPIONATE 50 MCG/ACT NA SUSP: 1.0000 | Freq: Every day | NASAL | Status: DC

## 2017-10-17 MED ORDER — LIDOCAINE VISCOUS 2 % MT SOLN: 5.0000 mL | OROMUCOSAL | 0 refills | Status: DC | PRN

## 2017-10-17 MED ORDER — FLUTICASONE PROPIONATE 50 MCG/ACT NA SUSP: 1.0000 | Freq: Every day | NASAL | 0 refills | Status: DC

## 2017-10-17 NOTE — Progress Notes (Addendum)
LOW-RISK PREGNANCY VISIT Patient name: Gail Fields MRN [id number]  Date of birth: 1986-03-16 Chief Complaint:   Routine Prenatal Visit (sore throat, cough, runny nose)  History of Present Illness:   Gail Fields is a 31 y.o. 364P1021 female at 54104w6d with an Estimated Date of Delivery: 11/15/17 being seen today for ongoing management of a low-risk pregnancy.  Today she reports sore throat, nasal stuffiness for a few days. Son is getting over a clod. Contractions: Irregular. Vag. Bleeding: None.  Movement: Present. denies leaking of fluid. Review of Systems:   Pertinent items are noted in HPI Denies abnormal vaginal discharge w/ itching/odor/irritation, headaches, visual changes, shortness of breath, chest pain, abdominal pain, severe nausea/vomiting, or problems with urination or bowel movements unless otherwise stated above.  Pertinent History Reviewed:  Medical & Surgical Hx:   Past Medical History:  Diagnosis Date  . Acute cystitis   . BV (bacterial vaginosis)   . Hemorrhoid   . Hx of chlamydia infection   . Patellofemoral stress syndrome   . 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 Trichimoniasis   . Vaginal Pap smear, abnormal 09/22/14   ASCUS- +HPV  . Yeast infection    Past Surgical History:  Procedure Laterality Date  . NO PAST SURGERIES     Family History  Problem Relation Age of Onset  . Cancer Maternal Grandmother        breast  . Seizures Paternal Grandmother     Current Outpatient Medications:  .  acetaminophen (TYLENOL) 500 MG tablet, Take 500 mg by mouth every 6 (six) hours as needed for moderate pain., Disp: , Rfl:  .  lidocaine (XYLOCAINE) 2 % jelly, Apply 1 application as needed topically., Disp: 30 mL, Rfl: 11 .  lidocaine (XYLOCAINE) 5 % ointment, Apply 1 application topically as needed., Disp: 35.44 g, Rfl: 11 .  pantoprazole (PROTONIX) 20 MG tablet, Take 1 tablet (20 mg total) by mouth daily., Disp: 30 tablet, Rfl: 3 .  prenatal vitamin w/FE, FA (PRENATAL 1 + 1)  27-1 MG TABS tablet, Take 1 tablet daily at 12 noon by mouth., Disp: 30 each, Rfl: 12 No current facility-administered medications for this visit.   Facility-Administered Medications Ordered in Other Visits:  .  lidocaine 2 % w/epi 1:200,000 (20 ml) with sod bicarb 8.4 % (2 ml) inj, , , Continuous PRN, Orlie PollenMerritt, Debra R, CRNA, 5 mL at 08/18/15 2327 Social History: Reviewed -  reports that  has never smoked. she has never used smokeless tobacco.  Physical Assessment:   Vitals:   10/17/17 1354  BP: 106/60  Pulse: 83  Temp: 98.1 F (36.7 C)  Weight: 189 lb (85.7 kg)  Body mass index is 30.05 kg/m.        Physical Examination:   General appearance: Well appearing, and in no distress  Mental status: Alert, oriented to person, place, and time HEENT:  Throat red, no exudate  Skin: Warm & dry  Cardiovascular: Normal heart rate noted  Respiratory: Normal respiratory effort, no distress  Abdomen: Soft, gravid, nontender  Pelvic: Cervical exam deferred         Extremities: Edema: None  Fetal Status:     Movement: Present    Results for orders placed or performed in visit on 10/17/17 (from the past 24 hour(s))  POCT urinalysis dipstick   Collection Time: 10/17/17  1:55 PM  Result Value Ref Range   Color, UA     Clarity, UA     Glucose, UA neg  Bilirubin, UA     Ketones, UA neg    Spec Grav, UA  1.010 - 1.025   Blood, UA neg    pH, UA  5.0 - 8.0   Protein, UA trace    Urobilinogen, UA  0.2 or 1.0 E.U./dL   Nitrite, UA neg    Leukocytes, UA Moderate (2+) (A) Negative   Appearance     Odor none     Assessment & Plan:  1) Low-risk pregnancy [id number] at 8963w6d with an Estimated Date of Delivery: 11/15/17   2) URI, most likely viral. Throat culture taken, rx flonase and viscous lidicaone. ,    Labs/procedures/US today: none  Plan:  Continue routine obstetrical care    Follow-up: Return in about 1 week (around 10/24/2017) for LROB.  Orders Placed This Encounter  Procedures    . POCT urinalysis dipstick   CRESENZO-DISHMAN,Zyniah Ferraiolo CNM 10/17/2017 2:02 PM

## 2017-10-17 NOTE — Addendum Note (Signed)
Addended by: Jacklyn ShellRESENZO-DISHMON, Kaushik Maul on: 10/17/2017 04:36 PM   Modules accepted: Orders

## 2017-10-17 NOTE — Patient Instructions (Addendum)
Gail LamerLeslie Fields, I greatly value your feedback.  If you receive a survey following your visit with us today, we appreciate you taking the time to fill it out.  Thanks, Cathie BeamsFran Cresenzo-Dishmon, CNM   Call the office [phone]) or go to [facility] if:  You begin to have strong, frequent contractions  Your water breaks.  Sometimes it is a big gush of fluid, sometimes it is just a trickle that keeps getting your panties wet or running down your legs  You have vaginal bleeding.  It is normal to have a small amount of spotting if your cervix was checked.   You don't feel your baby moving like normal.  If you don't, get you something to eat and drink and lay down and focus on feeling your baby move.  You should feel at least 10 movements in 2 hours.  If you don't, you should call the office or go to [facility].    Tdap Vaccine  It is recommended that you get the Tdap vaccine during the third trimester of EACH pregnancy to help protect your baby from getting pertussis (whooping cough)  27-36 weeks is the BEST time to do this so that you can pass the protection on to your baby. During pregnancy is better than after pregnancy, but if you are unable to get it during pregnancy it will be offered at the hospital.   You can get this vaccine at the health department or your family doctor  Everyone who will be around your baby should also be up-to-date on their vaccines. Adults (who are not pregnant) only need 1 dose of Tdap during adulthood.   Third Trimester of Pregnancy The third trimester is from week 29 through week 42, months 7 through 9. The third trimester is a time when the fetus is growing rapidly. At the end of the ninth month, the fetus is about 20 inches in length and weighs 6-10 pounds.  BODY CHANGES Your body goes through many changes during pregnancy. The changes vary from woman to woman.   Your weight will continue to increase. You can expect to gain 25-35 pounds (11-16 kg) by  the end of the pregnancy.  You may begin to get stretch marks on your hips, abdomen, and breasts.  You may urinate more often because the fetus is moving lower into your pelvis and pressing on your bladder.  You may develop or continue to have heartburn as a result of your pregnancy.  You may develop constipation because certain hormones are causing the muscles that push waste through your intestines to slow down.  You may develop hemorrhoids or swollen, bulging veins (varicose veins).  You may have pelvic pain because of the weight gain and pregnancy hormones relaxing your joints between the bones in your pelvis. Backaches may result from overexertion of the muscles supporting your posture.  You may have changes in your hair. These can include thickening of your hair, rapid growth, and changes in texture. Some women also have hair loss during or after pregnancy, or hair that feels dry or thin. Your hair will most likely return to normal after your baby is born.  Your breasts will continue to grow and be tender. A yellow discharge may leak from your breasts called colostrum.  Your belly button may stick out.  You may feel short of breath because of your expanding uterus.  You may notice the fetus "dropping," or moving lower in your abdomen.  You may have a bloody mucus discharge.  This usually occurs a few days to a week before labor begins.  Your cervix becomes thin and soft (effaced) near your due date. WHAT TO EXPECT AT YOUR PRENATAL EXAMS  You will have prenatal exams every 2 weeks until week 36. Then, you will have weekly prenatal exams. During a routine prenatal visit:  You will be weighed to make sure you and the fetus are growing normally.  Your blood pressure is taken.  Your abdomen will be measured to track your baby's growth.  The fetal heartbeat will be listened to.  Any test results from the previous visit will be discussed.  You may have a cervical check near your  due date to see if you have effaced. At around 36 weeks, your caregiver will check your cervix. At the same time, your caregiver will also perform a test on the secretions of the vaginal tissue. This test is to determine if a type of bacteria, Group B streptococcus, is present. Your caregiver will explain this further. Your caregiver may ask you:  What your birth plan is.  How you are feeling.  If you are feeling the baby move.  If you have had any abnormal symptoms, such as leaking fluid, bleeding, severe headaches, or abdominal cramping.  If you have any questions. Other tests or screenings that may be performed during your third trimester include:  Blood tests that check for low iron levels (anemia).  Fetal testing to check the health, activity level, and growth of the fetus. Testing is done if you have certain medical conditions or if there are problems during the pregnancy. FALSE LABOR You may feel small, irregular contractions that eventually go away. These are called Braxton Hicks contractions, or false labor. Contractions may last for hours, days, or even weeks before true labor sets in. If contractions come at regular intervals, intensify, or become painful, it is best to be seen by your caregiver.  SIGNS OF LABOR   Menstrual-like cramps.  Contractions that are 5 minutes apart or less.  Contractions that start on the top of the uterus and spread down to the lower abdomen and back.  A sense of increased pelvic pressure or back pain.  A watery or bloody mucus discharge that comes from the vagina. If you have any of these signs before the 37th week of pregnancy, call your caregiver right away. You need to go to the hospital to get checked immediately. HOME CARE INSTRUCTIONS   Avoid all smoking, herbs, alcohol, and unprescribed drugs. These chemicals affect the formation and growth of the baby.  Follow your caregiver's instructions regarding medicine use. There are medicines  that are either safe or unsafe to take during pregnancy.  Exercise only as directed by your caregiver. Experiencing uterine cramps is a good sign to stop exercising.  Continue to eat regular, healthy meals.  Wear a good support bra for breast tenderness.  Do not use hot tubs, steam rooms, or saunas.  Wear your seat belt at all times when driving.  Avoid raw meat, uncooked cheese, cat litter boxes, and soil used by cats. These carry germs that can cause birth defects in the baby.  Take your prenatal vitamins.  Try taking a stool softener (if your caregiver approves) if you develop constipation. Eat more high-fiber foods, such as fresh vegetables or fruit and whole grains. Drink plenty of fluids to keep your urine clear or pale yellow.  Take warm sitz baths to soothe any pain or discomfort caused by hemorrhoids. Use hemorrhoid  cream if your caregiver approves.  If you develop varicose veins, wear support hose. Elevate your feet for 15 minutes, 3-4 times a day. Limit salt in your diet.  Avoid heavy lifting, wear low heal shoes, and practice good posture.  Rest a lot with your legs elevated if you have leg cramps or low back pain.  Visit your dentist if you have not gone during your pregnancy. Use a soft toothbrush to brush your teeth and be gentle when you floss.  A sexual relationship may be continued unless your caregiver directs you otherwise.  Do not travel far distances unless it is absolutely necessary and only with the approval of your caregiver.  Take prenatal classes to understand, practice, and ask questions about the labor and delivery.  Make a trial run to the hospital.  Pack your hospital bag.  Prepare the baby's nursery.  Continue to go to all your prenatal visits as directed by your caregiver. SEEK MEDICAL CARE IF:  You are unsure if you are in labor or if your water has broken.  You have dizziness.  You have mild pelvic cramps, pelvic pressure, or nagging  pain in your abdominal area.  You have persistent nausea, vomiting, or diarrhea.  You have a bad smelling vaginal discharge.  You have pain with urination. SEEK IMMEDIATE MEDICAL CARE IF:   You have a fever.  You are leaking fluid from your vagina.  You have spotting or bleeding from your vagina.  You have severe abdominal cramping or pain.  You have rapid weight loss or gain.  You have shortness of breath with chest pain.  You notice sudden or extreme swelling of your face, hands, ankles, feet, or legs.  You have not felt your baby move in over an hour.  You have severe headaches that do not go away with medicine.  You have vision changes. Document Released: 10/17/2001 Document Revised: 10/28/2013 Document Reviewed: 12/24/2012 [facility] Patient Information 2015 [employer], [place]. This information is not intended to replace advice given to you by your health care provider. Make sure you discuss any questions you have with your health care provider.  Safe Medications in Pregnancy   Acne: Benzoyl Peroxide Salicylic Acid  Backache/Headache: Tylenol: 2 regular strength every 4 hours OR              2 Extra strength every 6 hours  Colds/Coughs/Allergies: Benadryl (alcohol free) 25 mg every 6 hours as needed Breath right strips Claritin Cepacol throat lozenges Chloraseptic throat spray Cold-Eeze- up to three times per day Cough drops, alcohol free Flonase (by prescription only) Guaifenesin Mucinex Robitussin DM (plain only, alcohol free) Saline nasal spray/drops Sudafed (pseudoephedrine) & Actifed ** use only after [redacted] weeks gestation and if you do not have high blood pressure Tylenol Vicks Vaporub Zinc lozenges Zyrtec   Constipation: Colace Ducolax suppositories Fleet enema Glycerin suppositories Metamucil Milk of magnesia Miralax Senokot Smooth move tea  Diarrhea: Kaopectate Imodium A-D  *NO pepto Bismol  Hemorrhoids: Anusol Anusol  HC Preparation H Tucks  Indigestion: Tums Maalox Mylanta Zantac  Pepcid  Insomnia: Benadryl (alcohol free) 25mg  every 6 hours as needed Tylenol PM Unisom, no Gelcaps  Leg Cramps: Tums MagGel  Nausea/Vomiting:  Bonine Dramamine Emetrol Ginger extract Sea bands Meclizine  Nausea medication to take during pregnancy:  Unisom (doxylamine succinate 25 mg tablets) Take one tablet daily at bedtime. If symptoms are not adequately controlled, the dose can be increased to a maximum recommended dose of two tablets daily (1/2 tablet in  the morning, 1/2 tablet mid-afternoon and one at bedtime). Vitamin B6 100mg  tablets. Take one tablet twice a day (up to 200 mg per day).  Skin Rashes: Aveeno products Benadryl cream or 25mg  every 6 hours as needed Calamine Lotion 1% cortisone cream  Yeast infection: Gyne-lotrimin 7 Monistat 7   **If taking multiple medications, please check labels to avoid duplicating the same active ingredients **take medication as directed on the label ** Do not exceed 4000 mg of tylenol in 24 hours **Do not take medications that contain aspirin or ibuprofen

## 2017-10-17 NOTE — Telephone Encounter (Signed)
Patient called stating that she just seen Gail Fields and [facility] and she states that they don't have it. Please contact pt

## 2017-10-19 ENCOUNTER — Encounter: Payer: Self-pay | Admitting: Advanced Practice Midwife

## 2017-10-19 LAB — CULTURE, GROUP A STREP: Strep A Culture: NEGATIVE

## 2017-10-24 ENCOUNTER — Ambulatory Visit (INDEPENDENT_AMBULATORY_CARE_PROVIDER_SITE_OTHER): Payer: BC Managed Care – PPO | Admitting: Obstetrics and Gynecology

## 2017-10-24 VITALS — BP 104/66 | HR 80 | Wt 192.0 lb

## 2017-10-24 DIAGNOSIS — Z1389 Encounter for screening for other disorder: Secondary | ICD-10-CM

## 2017-10-24 DIAGNOSIS — Z3483 Encounter for supervision of other normal pregnancy, third trimester: Secondary | ICD-10-CM

## 2017-10-24 DIAGNOSIS — Z3A36 36 weeks gestation of pregnancy: Secondary | ICD-10-CM

## 2017-10-24 DIAGNOSIS — Z331 Pregnant state, incidental: Secondary | ICD-10-CM

## 2017-10-24 LAB — POCT URINALYSIS DIPSTICK
Blood, UA: NEGATIVE
GLUCOSE UA: NEGATIVE
KETONES UA: NEGATIVE
Leukocytes, UA: NEGATIVE
Nitrite, UA: NEGATIVE
Protein, UA: NEGATIVE

## 2017-10-24 LAB — OB RESULTS CONSOLE GBS: STREP GROUP B AG: POSITIVE

## 2017-10-24 NOTE — Progress Notes (Signed)
Gail Fields is a 31 y.o. female [id number]  Estimated Date of Delivery: 11/15/17 LROB 7176w6d  Chief Complaint  Patient presents with  . Routine Prenatal Visit    gbs/gc  ____  Patient has no complaints. She reports occasional cramps that feel like menstrual cramps. Patient reports  good fetal movement,                           denies any bleeding , rupture of membranes,or regular contractions.  Blood pressure 104/66, pulse 80, weight 192 lb (87.1 kg), last menstrual period 02/08/2017.   Urine results:notable for none refer to the ob flow sheet for FH and FHR, ,                          Physical Examination: General appearance - alert, well appearing, and in no distress                                      Abdomen - FH 36 ,                                                         -FHR 154                                                         soft, nontender, nondistended, no masses or organomegaly                                      Pelvic - GBS/GC taken                                         Cervix: Closed, long, and high , ballotable   Questions were answered.  Assessment: LROB [id number] @ 2876w6d Estimated Date of Delivery: 11/15/17  Plan:  Continued routine obstetrical care, LROB F/u in 1 weeks for LROB   By signing my name below, I, Izna Ahmed, attest that this documentation has been prepared under the direction and in the presence of Tilda BurrowFerguson, Staley Lunz V, MD. Electronically Signed: Redge GainerIzna Ahmed, Medical Scribe. 10/24/17. 11:15 AM.  I personally performed the services described in this documentation, which was SCRIBED in my presence. The recorded information has been reviewed and considered accurate. It has been edited as necessary during review. Tilda BurrowJohn V Jakiya Bookbinder, MD

## 2017-10-26 DIAGNOSIS — F0781 Postconcussional syndrome: Secondary | ICD-10-CM | POA: Insufficient documentation

## 2017-10-27 LAB — GC/CHLAMYDIA PROBE AMP
CHLAMYDIA, DNA PROBE: NEGATIVE
Neisseria gonorrhoeae by PCR: NEGATIVE

## 2017-10-27 LAB — CULTURE, BETA STREP (GROUP B ONLY): STREP GP B CULTURE: POSITIVE — AB

## 2017-10-31 ENCOUNTER — Ambulatory Visit (INDEPENDENT_AMBULATORY_CARE_PROVIDER_SITE_OTHER): Payer: BC Managed Care – PPO | Admitting: Advanced Practice Midwife

## 2017-10-31 ENCOUNTER — Encounter: Payer: BC Managed Care – PPO | Admitting: Advanced Practice Midwife

## 2017-10-31 ENCOUNTER — Encounter: Payer: Self-pay | Admitting: Advanced Practice Midwife

## 2017-10-31 VITALS — BP 100/58 | HR 78 | Wt 190.0 lb

## 2017-10-31 DIAGNOSIS — Z3A37 37 weeks gestation of pregnancy: Secondary | ICD-10-CM

## 2017-10-31 DIAGNOSIS — Z1389 Encounter for screening for other disorder: Secondary | ICD-10-CM

## 2017-10-31 DIAGNOSIS — Z331 Pregnant state, incidental: Secondary | ICD-10-CM

## 2017-10-31 DIAGNOSIS — Z3483 Encounter for supervision of other normal pregnancy, third trimester: Secondary | ICD-10-CM

## 2017-10-31 LAB — POCT URINALYSIS DIPSTICK
Blood, UA: NEGATIVE
GLUCOSE UA: NEGATIVE
LEUKOCYTES UA: NEGATIVE
Nitrite, UA: NEGATIVE
Protein, UA: NEGATIVE

## 2017-10-31 MED ORDER — TERCONAZOLE 0.4 % VA CREA: 1.0000 | TOPICAL_CREAM | Freq: Every day | VAGINAL | 0 refills | Status: DC

## 2017-10-31 NOTE — Patient Instructions (Signed)

## 2017-10-31 NOTE — Progress Notes (Signed)
[id number] 5757w6d Estimated Date of Delivery: 11/15/17  Last menstrual period 02/08/2017.   BP weight and urine results all reviewed and noted.  Please refer to the obstetrical flow sheet for the fundal height and fetal heart rate documentation:  Patient reports good fetal movement, denies any bleeding and no rupture of membranes symptoms or regular contractions. Patient c/o vaginal itch/white DC.  DC no odor, wet prep + WBC, yeast. Outer os 3-4, but inner os FT.       All questions were answered.  No orders of the defined types were placed in this encounter.   Plan:  Continued routine obstetrical care, rx terezol  Return in about 1 week (around 11/07/2017) for LROB.

## 2017-11-06 NOTE — L&D Delivery Note (Signed)
Operative Delivery Note At 5:40 AM a viable female was delivered via Vaginal, Vacuum Investment banker, operational(Extractor).  Presentation: vertex; Position: Occiput,, Posterior; Station: +2.  Verbal consent: obtained from patient.  Risks and benefits discussed in detail.  Risks include, but are not limited to the risks of anesthesia, bleeding, infection, damage to maternal tissues, fetal cephalhematoma.  There is also the risk of inability to effect vaginal delivery of the head, or shoulder dystocia that cannot be resolved by established maneuvers, leading to the need for emergency cesarean section.  Called to patient's room due to fetal bradycardia. FHT in 60s. Verbal consent for vacuum. Mity Vacuum extractor used. During contraction, vacuum applied and increased to green zone. Pull along axis with contraction. 2 pop-offs. Baby delivered on second contraction - direct OP. Double nuchal cord on delivery.   APGAR: 5, 9; weight pending.   Placenta status: intact, spontaneous.   Cord: 3vc  with the following complications: .  Cord pH: 7.14. Corrected pH: 7.37  Anesthesia:  epidural Instruments: correct Episiotomy:  none Lacerations:  none Est. Blood Loss (mL):  200  Mom to postpartum.  Baby to Nursery.  Gail HeritageJacob J Dynastie Fields 11/14/2017, 5:52 AM

## 2017-11-07 ENCOUNTER — Ambulatory Visit (INDEPENDENT_AMBULATORY_CARE_PROVIDER_SITE_OTHER): Payer: BC Managed Care – PPO | Admitting: Women's Health

## 2017-11-07 ENCOUNTER — Encounter: Payer: Self-pay | Admitting: Women's Health

## 2017-11-07 ENCOUNTER — Encounter: Payer: BC Managed Care – PPO | Admitting: Obstetrics and Gynecology

## 2017-11-07 VITALS — BP 110/60 | HR 97 | Wt 193.0 lb

## 2017-11-07 DIAGNOSIS — Z3A38 38 weeks gestation of pregnancy: Secondary | ICD-10-CM

## 2017-11-07 DIAGNOSIS — Z1389 Encounter for screening for other disorder: Secondary | ICD-10-CM

## 2017-11-07 DIAGNOSIS — Z3483 Encounter for supervision of other normal pregnancy, third trimester: Secondary | ICD-10-CM

## 2017-11-07 DIAGNOSIS — Z331 Pregnant state, incidental: Secondary | ICD-10-CM

## 2017-11-07 LAB — POCT URINALYSIS DIPSTICK
Blood, UA: NEGATIVE
Glucose, UA: NEGATIVE
Ketones, UA: NEGATIVE
Leukocytes, UA: NEGATIVE
Nitrite, UA: NEGATIVE

## 2017-11-07 NOTE — Progress Notes (Signed)
   LOW-RISK PREGNANCY VISIT Patient name: Gail Fields MRN [id number]  Date of birth: 04-30-1986 Chief Complaint:   Routine Prenatal Visit  History of Present Illness:   Gail Fields is a 32 y.o. [id number] female at 2076w6d with an Estimated Date of Delivery: 11/15/17 being seen today for ongoing management of a low-risk pregnancy.  Today she reports no complaints. Contractions: Not present. Vag. Bleeding: None.  Movement: Present. denies leaking of fluid. Review of Systems:   Pertinent items are noted in HPI Denies abnormal vaginal discharge w/ itching/odor/irritation, headaches, visual changes, shortness of breath, chest pain, abdominal pain, severe nausea/vomiting, or problems with urination or bowel movements unless otherwise stated above. Pertinent History Reviewed:  Reviewed past medical,surgical, social, obstetrical and family history.  Reviewed problem list, medications and allergies. Physical Assessment:   Vitals:   11/07/17 1026  BP: 110/60  Pulse: 97  Weight: 193 lb (87.5 kg)  Body mass index is 30.68 kg/m.        Physical Examination:   General appearance: Well appearing, and in no distress  Mental status: Alert, oriented to person, place, and time  Skin: Warm & dry  Cardiovascular: Normal heart rate noted  Respiratory: Normal respiratory effort, no distress  Abdomen: Soft, gravid, nontender  Pelvic: Cervical exam performed per pt request Dilation: 1 Effacement (%): Thick Station: Ballotable  Extremities: Edema: None  Fetal Status: Fetal Heart Rate (bpm): 132 Fundal Height: 38 cm Movement: Present Presentation: Vertex by Leopold's  Results for orders placed or performed in visit on 11/07/17 (from the past 24 hour(s))  POCT urinalysis dipstick   Collection Time: 11/07/17 10:34 AM  Result Value Ref Range   Color, UA     Clarity, UA     Glucose, UA neg    Bilirubin, UA     Ketones, UA neg    Spec Grav, UA  1.010 - 1.025   Blood, UA neg    pH, UA  5.0 - 8.0   Protein, UA trace    Urobilinogen, UA  0.2 or 1.0 E.U./dL   Nitrite, UA neg    Leukocytes, UA Negative Negative   Appearance     Odor      Assessment & Plan:  1) Low-risk pregnancy [id number] at 4576w6d with an Estimated Date of Delivery: 11/15/17    Labs/procedures today: sve  Plan:  Continue routine obstetrical care   Reviewed: Term labor symptoms and general obstetric precautions including but not limited to vaginal bleeding, contractions, leaking of fluid and fetal movement were reviewed in detail with the patient.  All questions were answered  Follow-up: Return in about 1 week (around 11/14/2017) for LROB.  Orders Placed This Encounter  Procedures  . POCT urinalysis dipstick   Marge DuncansBooker, Lanore Renderos Randall CNM, [facility]cWHNP-BC 11/07/2017 11:18 AM

## 2017-11-07 NOTE — Patient Instructions (Signed)
Jocelyn LamerLeslie Bonelli, I greatly value your feedback.  If you receive a survey following your visit with us today, we appreciate you taking the time to fill it out.  Thanks, Joellyn HaffKim Booker, CNM, WHNP-BC   Call the office [phone]) or go to [facility] if:  You begin to have strong, frequent contractions  Your water breaks.  Sometimes it is a big gush of fluid, sometimes it is just a trickle that keeps getting your panties wet or running down your legs  You have vaginal bleeding.  It is normal to have a small amount of spotting if your cervix was checked.   You don't feel your baby moving like normal.  If you don't, get you something to eat and drink and lay down and focus on feeling your baby move.  You should feel at least 10 movements in 2 hours.  If you don't, you should call the office or go to [facility].     [facility] Cont[facility] Contractions of the uterus can occur throughout pregnancy, but they are not always a sign that you are in labor. You may have practice contractions called Braxton Hicks contractions. These false labor contractions are sometimes confused with true labor. What are Deberah PeltonBraxton Hicks contractions? Braxton Hicks contractions are tightening movements that occur in the muscles of the uterus before labor. Unlike true labor contractions, these contractions do not result in opening (dilation) and thinning of the cervix. Toward the end of pregnancy (32-34 weeks), Braxton Hicks contractions can happen more often and may become stronger. These contractions are sometimes difficult to tell apart from true labor because they can be very uncomfortable. You should not feel embarrassed if you go to the hospital with false labor. Sometimes, the only way to tell if you are in true labor is for your health care provider to look for changes in the cervix. The health care provider will do a physical exam and may monitor your contractions. If you are not in true labor, the exam should show  that your cervix is not dilating and your water has not broken. If there are other health problems associated with your pregnancy, it is completely safe for you to be sent home with false labor. You may continue to have Braxton Hicks contractions until you go into true labor. How to tell the difference between true labor and false labor True labor  Contractions last 30-70 seconds.  Contractions become very regular.  Discomfort is usually felt in the top of the uterus, and it spreads to the lower abdomen and low back.  Contractions do not go away with walking.  Contractions usually become more intense and increase in frequency.  The cervix dilates and gets thinner. False labor  Contractions are usually shorter and not as strong as true labor contractions.  Contractions are usually irregular.  Contractions are often felt in the front of the lower abdomen and in the groin.  Contractions may go away when you walk around or change positions while lying down.  Contractions get weaker and are shorter-lasting as time goes on.  The cervix usually does not dilate or become thin. Follow these instructions at home:  Take over-the-counter and prescription medicines only as told by your health care provider.  Keep up with your usual exercises and follow other instructions from your health care provider.  Eat and drink lightly if you think you are going into labor.  If Braxton Hicks contractions are making you uncomfortable: ? Change your position from lying down  or resting to walking, or change from walking to resting. ? Sit and rest in a tub of warm water. ? Drink enough fluid to keep your urine pale yellow. Dehydration may cause these contractions. ? Do slow and deep breathing several times an hour.  Keep all follow-up prenatal visits as told by your health care provider. This is important. Contact a health care provider if:  You have a fever.  You have continuous pain in your  abdomen. Get help right away if:  Your contractions become stronger, more regular, and closer together.  You have fluid leaking or gushing from your vagina.  You pass blood-tinged mucus (bloody show).  You have bleeding from your vagina.  You have low back pain that you never had before.  You feel your baby's head pushing down and causing pelvic pressure.  Your baby is not moving inside you as much as it used to. Summary  Contractions that occur before labor are called Braxton Hicks contractions, false labor, or practice contractions.  Braxton Hicks contractions are usually shorter, weaker, farther apart, and less regular than true labor contractions. True labor contractions usually become progressively stronger and regular and they become more frequent.  Manage discomfort from [facility] contractions by changing position, resting in a warm bath, drinking plenty of water, or practicing deep breathing. This information is not intended to replace advice given to you by your health care provider. Make sure you discuss any questions you have with your health care provider. Document Released: 03/08/2017 Document Revised: 03/08/2017 Document Reviewed: 03/08/2017 Elsevier Interactive Patient Education  2018 [employer].

## 2017-11-13 ENCOUNTER — Encounter (HOSPITAL_COMMUNITY): Payer: Self-pay | Admitting: General Practice

## 2017-11-13 ENCOUNTER — Telehealth: Payer: Self-pay | Admitting: *Deleted

## 2017-11-13 ENCOUNTER — Encounter: Payer: Self-pay | Admitting: Obstetrics & Gynecology

## 2017-11-13 ENCOUNTER — Inpatient Hospital Stay (HOSPITAL_COMMUNITY)
Admission: AD | Admit: 2017-11-13 | Discharge: 2017-11-16 | DRG: 807 | Disposition: A | Discharge status: Home / Self Care | Payer: BC Managed Care – PPO | Source: Ambulatory Visit | Attending: Family Medicine | Admitting: Family Medicine

## 2017-11-13 ENCOUNTER — Ambulatory Visit (INDEPENDENT_AMBULATORY_CARE_PROVIDER_SITE_OTHER): Payer: BC Managed Care – PPO | Admitting: Obstetrics & Gynecology

## 2017-11-13 ENCOUNTER — Inpatient Hospital Stay (HOSPITAL_COMMUNITY): Payer: BC Managed Care – PPO | Admitting: Anesthesiology

## 2017-11-13 VITALS — BP 104/60 | HR 98 | Wt 195.0 lb

## 2017-11-13 DIAGNOSIS — Z3A39 39 weeks gestation of pregnancy: Secondary | ICD-10-CM

## 2017-11-13 DIAGNOSIS — Z3480 Encounter for supervision of other normal pregnancy, unspecified trimester: Secondary | ICD-10-CM

## 2017-11-13 DIAGNOSIS — O4290 Premature rupture of membranes, unspecified as to length of time between rupture and onset of labor, unspecified weeks of gestation: Secondary | ICD-10-CM | POA: Diagnosis present

## 2017-11-13 DIAGNOSIS — O4202 Full-term premature rupture of membranes, onset of labor within 24 hours of rupture: Secondary | ICD-10-CM

## 2017-11-13 DIAGNOSIS — Z1389 Encounter for screening for other disorder: Secondary | ICD-10-CM

## 2017-11-13 DIAGNOSIS — O99824 Streptococcus B carrier state complicating childbirth: Secondary | ICD-10-CM | POA: Diagnosis present

## 2017-11-13 DIAGNOSIS — O4292 Full-term premature rupture of membranes, unspecified as to length of time between rupture and onset of labor: Principal | ICD-10-CM | POA: Diagnosis present

## 2017-11-13 DIAGNOSIS — Z331 Pregnant state, incidental: Secondary | ICD-10-CM

## 2017-11-13 LAB — POCT URINALYSIS DIPSTICK
GLUCOSE UA: NEGATIVE
KETONES UA: NEGATIVE
Leukocytes, UA: NEGATIVE
Nitrite, UA: NEGATIVE
Protein, UA: NEGATIVE
RBC UA: NEGATIVE

## 2017-11-13 LAB — TYPE AND SCREEN
ABO/RH(D): A POS
ANTIBODY SCREEN: NEGATIVE

## 2017-11-13 LAB — CBC
HCT: 36 % (ref 36.0–46.0)
Hemoglobin: 12.3 g/dL (ref 12.0–15.0)
MCH: 31.5 pg (ref 26.0–34.0)
MCHC: 34.2 g/dL (ref 30.0–36.0)
MCV: 92.1 fL (ref 78.0–100.0)
Platelets: 163 10*3/uL (ref 150–400)
RBC: 3.91 MIL/uL (ref 3.87–5.11)
RDW: 13.5 % (ref 11.5–15.5)
WBC: 10.4 10*3/uL (ref 4.0–10.5)

## 2017-11-13 MED ORDER — EPHEDRINE 5 MG/ML INJ: 10.0000 mg | INTRAVENOUS | Status: DC | PRN

## 2017-11-13 MED ORDER — OXYTOCIN BOLUS FROM INFUSION
500.0000 mL | Freq: Once | INTRAVENOUS | Status: AC
  Administered 2017-11-14: 500 mL via INTRAVENOUS

## 2017-11-13 MED ORDER — TERBUTALINE SULFATE 1 MG/ML IJ SOLN: 0.2500 mg | Freq: Once | INTRAMUSCULAR | Status: DC | PRN

## 2017-11-13 MED ORDER — LIDOCAINE HCL (PF) 1 % IJ SOLN
INTRAMUSCULAR | Status: DC | PRN
  Administered 2017-11-13 (×2): 4 mL

## 2017-11-13 MED ORDER — DIPHENHYDRAMINE HCL 50 MG/ML IJ SOLN
12.5000 mg | INTRAMUSCULAR | Status: DC | PRN
  Administered 2017-11-14: 12.5 mg via INTRAVENOUS
  Filled 2017-11-13: qty 1

## 2017-11-13 MED ORDER — OXYCODONE-ACETAMINOPHEN 5-325 MG PO TABS: 1.0000 | ORAL_TABLET | ORAL | Status: DC | PRN

## 2017-11-13 MED ORDER — FLEET ENEMA 7-19 GM/118ML RE ENEM: 1.0000 | ENEMA | RECTAL | Status: DC | PRN

## 2017-11-13 MED ORDER — PENICILLIN G POTASSIUM 5000000 UNITS IJ SOLR
5.0000 10*6.[IU] | Freq: Once | INTRAVENOUS | Status: AC
  Administered 2017-11-13: 5 10*6.[IU] via INTRAVENOUS
  Filled 2017-11-13: qty 5

## 2017-11-13 MED ORDER — LACTATED RINGERS IV SOLN: 500.0000 mL | INTRAVENOUS | Status: DC | PRN

## 2017-11-13 MED ORDER — MISOPROSTOL 50MCG HALF TABLET
50.0000 ug | ORAL_TABLET | ORAL | Status: DC | PRN
  Administered 2017-11-13: 50 ug via BUCCAL
  Filled 2017-11-13: qty 1

## 2017-11-13 MED ORDER — OXYTOCIN 40 UNITS IN LACTATED RINGERS INFUSION - SIMPLE MED
2.5000 [IU]/h | INTRAVENOUS | Status: DC
  Filled 2017-11-13: qty 1000

## 2017-11-13 MED ORDER — ACETAMINOPHEN 325 MG PO TABS
650.0000 mg | ORAL_TABLET | ORAL | Status: DC | PRN
  Administered 2017-11-13: 650 mg via ORAL
  Filled 2017-11-13: qty 2

## 2017-11-13 MED ORDER — LIDOCAINE HCL (PF) 1 % IJ SOLN
30.0000 mL | INTRAMUSCULAR | Status: DC | PRN
  Filled 2017-11-13: qty 30

## 2017-11-13 MED ORDER — PHENYLEPHRINE 40 MCG/ML (10ML) SYRINGE FOR IV PUSH (FOR BLOOD PRESSURE SUPPORT)
80.0000 ug | PREFILLED_SYRINGE | INTRAVENOUS | Status: DC | PRN
  Filled 2017-11-13: qty 10

## 2017-11-13 MED ORDER — MISOPROSTOL 25 MCG QUARTER TABLET: 25.0000 ug | ORAL_TABLET | ORAL | Status: DC | PRN

## 2017-11-13 MED ORDER — PHENYLEPHRINE 40 MCG/ML (10ML) SYRINGE FOR IV PUSH (FOR BLOOD PRESSURE SUPPORT)
80.0000 ug | PREFILLED_SYRINGE | INTRAVENOUS | Status: DC | PRN
  Administered 2017-11-13: 80 ug via INTRAVENOUS

## 2017-11-13 MED ORDER — FENTANYL CITRATE (PF) 100 MCG/2ML IJ SOLN
100.0000 ug | INTRAMUSCULAR | Status: DC | PRN
  Administered 2017-11-13: 100 ug via INTRAVENOUS
  Filled 2017-11-13: qty 2

## 2017-11-13 MED ORDER — ONDANSETRON HCL 4 MG/2ML IJ SOLN
4.0000 mg | Freq: Four times a day (QID) | INTRAMUSCULAR | Status: DC | PRN
  Administered 2017-11-14: 4 mg via INTRAVENOUS
  Filled 2017-11-13: qty 2

## 2017-11-13 MED ORDER — OXYCODONE-ACETAMINOPHEN 5-325 MG PO TABS: 2.0000 | ORAL_TABLET | ORAL | Status: DC | PRN

## 2017-11-13 MED ORDER — FENTANYL 2.5 MCG/ML BUPIVACAINE 1/10 % EPIDURAL INFUSION (WH - ANES)
14.0000 mL/h | INTRAMUSCULAR | Status: DC | PRN
  Administered 2017-11-13 – 2017-11-14 (×2): 14 mL/h via EPIDURAL
  Filled 2017-11-13 (×2): qty 100

## 2017-11-13 MED ORDER — PENICILLIN G POT IN DEXTROSE 60000 UNIT/ML IV SOLN
3.0000 10*6.[IU] | INTRAVENOUS | Status: DC
  Administered 2017-11-13 – 2017-11-14 (×3): 3 10*6.[IU] via INTRAVENOUS
  Filled 2017-11-13 (×5): qty 50

## 2017-11-13 MED ORDER — SOD CITRATE-CITRIC ACID 500-334 MG/5ML PO SOLN
30.0000 mL | ORAL | Status: DC | PRN
  Administered 2017-11-13 (×2): 30 mL via ORAL
  Filled 2017-11-13 (×2): qty 15

## 2017-11-13 MED ORDER — LACTATED RINGERS IV SOLN: 500.0000 mL | Freq: Once | INTRAVENOUS | Status: DC

## 2017-11-13 MED ORDER — FENTANYL 2.5 MCG/ML BUPIVACAINE 1/10 % EPIDURAL INFUSION (WH - ANES): 14.0000 mL/h | INTRAMUSCULAR | Status: DC | PRN

## 2017-11-13 MED ORDER — LACTATED RINGERS IV SOLN
INTRAVENOUS | Status: DC
  Administered 2017-11-13: 16:00:00 via INTRAVENOUS

## 2017-11-13 NOTE — Anesthesia Preprocedure Evaluation (Signed)
Anesthesia Evaluation  Patient identified by MRN, date of birth, ID band Patient awake    Reviewed: Allergy & Precautions, H&P , NPO status , Patient's Chart, lab work & pertinent test results  Airway Mallampati: I  TM Distance: >3 FB Neck ROM: full    Dental no notable dental hx.    Pulmonary neg pulmonary ROS,    Pulmonary exam normal        Cardiovascular negative cardio ROS Normal cardiovascular exam     Neuro/Psych negative neurological ROS  negative psych ROS   GI/Hepatic negative GI ROS, Neg liver ROS,   Endo/Other  negative endocrine ROS  Renal/GU negative Renal ROS     Musculoskeletal   Abdominal (+) + obese,   Peds  Hematology negative hematology ROS (+)   Anesthesia Other Findings   Reproductive/Obstetrics (+) Pregnancy                             Anesthesia Physical  Anesthesia Plan  ASA: II  Anesthesia Plan: Epidural   Post-op Pain Management:    Induction:   PONV Risk Score and Plan:   Airway Management Planned:   Additional Equipment:   Intra-op Plan:   Post-operative Plan:   Informed Consent: I have reviewed the patients History and Physical, chart, labs and discussed the procedure including the risks, benefits and alternatives for the proposed anesthesia with the patient or authorized representative who has indicated his/her understanding and acceptance.     Plan Discussed with:   Anesthesia Plan Comments:         Anesthesia Quick Evaluation

## 2017-11-13 NOTE — Anesthesia Procedure Notes (Signed)
Epidural Patient location during procedure: OB  Staffing Anesthesiologist: Kage Willmann, MD Performed: anesthesiologist   Preanesthetic Checklist Completed: patient identified, pre-op evaluation, timeout performed, IV checked, risks and benefits discussed and monitors and equipment checked  Epidural Patient position: sitting Prep: site prepped and draped and DuraPrep Patient monitoring: heart rate, continuous pulse ox and blood pressure Approach: midline Location: L3-L4 Injection technique: LOR air and LOR saline  Needle:  Needle type: Tuohy  Needle gauge: 17 G Needle length: 9 cm Needle insertion depth: 6 cm Catheter type: closed end flexible Catheter size: 19 Gauge Catheter at skin depth: 11 cm Test dose: negative  Assessment Sensory level: T8 Events: blood not aspirated, injection not painful, no injection resistance, negative IV test and no paresthesia  Additional Notes Reason for block:procedure for pain     

## 2017-11-13 NOTE — Telephone Encounter (Signed)
Patient called stating she thinks her water has ruptured.  She states aroung 3am she noticed some wetness in her panties but did not get her bed wet.  She has since started cramping.  She put a panty liner in this am and has noticed it is slightly moist.  Baby is moving and she is not having any bleeding.  Will w/i to rule out rupture.  Pt to come in at 10:45.

## 2017-11-13 NOTE — H&P (Signed)
OBSTETRIC ADMISSION HISTORY AND PHYSICAL  Gail Fields is a 32 y.o. female [phone] with IUP at [redacted]w[redacted]d by u/s <7 weeks presenting for IOL for PROM. She is coming from OB/Gyn from routine visit after feeling gush of fluid at 0345. In office, no pooling, negative nitrazine with + ferning on microscope. She reports +FMs, No VB, no blurry vision, headaches or peripheral edema, and RUQ pain.  She plans on breast feeding. She is unsure if she wasn't depo-shot vs IUD for birth control. She received her [facility] at [facility].   Dating: By U/s <7 weeks --->  Estimated Date of Delivery: 11/15/17  Sono:   @[redacted]w[redacted]d , CWD, normal anatomy, cephalic presentation,  402g EFW  Prenatal History/Complications: Prenatal care at [facility] Complications: - UTI in pregnancy - GBS +  Past Medical History: Past Medical History:  Diagnosis Date  . Acute cystitis   . BV (bacterial vaginosis)   . Hemorrhoid   . Hx of chlamydia infection   . Patellofemoral stress syndrome   . 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 Trichimoniasis   . Vaginal Pap smear, abnormal 09/22/14   ASCUS- +HPV  . Yeast infection     Past Surgical History: Past Surgical History:  Procedure Laterality Date  . NO PAST SURGERIES      Obstetrical History: OB History    Gravida Para Term Preterm AB Living   4 1 1   2 1    SAB TAB Ectopic Multiple Live Births   1 1   0 1      Social History: Social History   Socioeconomic History  . Marital status: Single    Spouse name: Not on file  . Number of children: Not on file  . Years of education: Not on file  . Highest education level: Not on file  Social Needs  . Financial resource strain: Not on file  . Food insecurity - worry: Not on file  . Food insecurity - inability: Not on file  . Transportation needs - medical: Not on file  . Transportation needs - non-medical: Not on file  Occupational History  . Not on file  Tobacco Use  . Smoking status: Never Smoker  . Smokeless tobacco: Never  Used  Substance and Sexual Activity  . Alcohol use: No  . Drug use: No  . Sexual activity: Yes    Birth control/protection: None  Other Topics Concern  . Not on file  Social History Narrative  . Not on file    Family History: Family History  Problem Relation Age of Onset  . Cancer Maternal Grandmother        breast  . Seizures Paternal Grandmother     Allergies: No Known Allergies  Facility-Administered Medications Prior to Admission  Medication Dose Route Frequency Provider Last Rate Last Dose  . fluticasone (FLONASE) 50 MCG/ACT nasal spray 1 spray  1 spray Each Nare Daily Cresenzo-Dishmon, Frances, CNM       Medications Prior to Admission  Medication Sig Dispense Refill Last Dose  . acetaminophen (TYLENOL) 500 MG tablet Take 500 mg by mouth every 6 (six) hours as needed for moderate pain.   Taking  . lidocaine (XYLOCAINE) 2 % jelly Apply 1 application as needed topically. 30 mL 11 Taking  . lidocaine (XYLOCAINE) 5 % ointment Apply 1 application topically as needed. 35.44 g 11 Taking  . pantoprazole (PROTONIX) 20 MG tablet Take 1 tablet (20 mg total) by mouth daily. 30 tablet 3 Taking  . prenatal vitamin  w/FE, FA (PRENATAL 1 + 1) 27-1 MG TABS tablet Take 1 tablet daily at 12 noon by mouth. 30 each 12 Taking     Review of Systems   All systems reviewed and negative except as stated in HPI  Blood pressure 119/71, pulse 83, temperature 98.1 F (36.7 C), temperature source Oral, resp. rate 18, height 5\' 6"  (1.676 m), weight 195 lb 6.4 oz (88.6 kg), last menstrual period 02/08/2017. General appearance: alert, cooperative, appears stated age and no distress Lungs: clear to auscultation bilaterally Heart: regular rate and rhythm Abdomen: gravid, soft, non-tender; bowel sounds normal Extremities: Homans sign is negative, no sign of DVT Presentation: unsure Fetal monitoringBaseline: 135 bpm, Variability: Good {> 6 bpm), Accelerations: Reactive and Decelerations:  Absent Uterine activity: irritability Dilation: 2 Effacement (%): Thick Station: Ballotable Exam by:: Dr. Nira Retortegele   Prenatal labs: ABO, Rh: A/Positive/-- (06/12 1154) Antibody: Negative (10/15 0942) Rubella: 2.54 (06/12 1154) RPR: Non Reactive (10/15 0942)  HBsAg: Negative (06/12 1154)  HIV: Non Reactive (10/15 0942)  GBS:   positive 2 hr Glucola 83/83/79 Genetic screening  normal Anatomy US WNL  Prenatal Transfer Tool  Maternal Diabetes: No Genetic Screening: Normal Maternal Ultrasounds/Referrals: Normal Fetal Ultrasounds or other Referrals:  None Maternal Substance Abuse:  No Significant Maternal Medications:  None Significant Maternal Lab Results: Lab values include: Group B Strep positive  Results for orders placed or performed during the hospital encounter of 11/13/17 (from the past 24 hour(s))  CBC   Collection Time: 11/13/17  4:10 PM  Result Value Ref Range   WBC 10.4 4.0 - 10.5 K/uL   RBC 3.91 3.87 - 5.11 MIL/uL   Hemoglobin 12.3 12.0 - 15.0 g/dL   HCT 16.136.0 09.636.0 - 04.546.0 %   MCV 92.1 78.0 - 100.0 fL   MCH 31.5 26.0 - 34.0 pg   MCHC 34.2 30.0 - 36.0 g/dL   RDW 40.913.5 81.111.5 - 91.415.5 %   Platelets 163 150 - 400 K/uL  Type and screen [facility]   Collection Time: 11/13/17  4:10 PM  Result Value Ref Range   ABO/RH(D) A POS    Antibody Screen NEG    Sample Expiration 11/16/2017   Results for orders placed or performed in visit on 11/13/17 (from the past 24 hour(s))  POCT urinalysis dipstick   Collection Time: 11/13/17 11:24 AM  Result Value Ref Range   Color, UA     Clarity, UA     Glucose, UA neg    Bilirubin, UA     Ketones, UA neg    Spec Grav, UA  1.010 - 1.025   Blood, UA neg    pH, UA  5.0 - 8.0   Protein, UA neg    Urobilinogen, UA  0.2 or 1.0 E.U./dL   Nitrite, UA neg    Leukocytes, UA Negative Negative   Appearance     Odor      Assessment/Plan:  Jocelyn LamerLeslie Weich is a 32 y.o. [id number] at 4237w5d here for IOL for premature  ROM.  #Labor: Will induce for PROM. Will discuss foley balloon. Place cytotec.  #Pain: controlled #FWB: Reactive NST #ID:  GBS+, will provide ppx with PCN #MOF: breast #MOC: depo vs IUD, undecided #Circ:  n/a  Alroy BailiffParker W Leland, MD  11/13/2017, 3:47 PM  OB FELLOW HISTORY AND PHYSICAL ATTESTATION I have seen and examined this patient; I agree with above documentation in the resident's note.   PROM at 0345 am today. Not in labor Cytotec buccally  for cervical ripening GBS pos, start PCN  Frederik Pear, MD OB Fellow 11/13/2017

## 2017-11-13 NOTE — Progress Notes (Signed)
Jocelyn LamerLeslie Dase is a 32 y.o. G4P1021 at 3132w5d admitted for PROM  Subjective:  No complaints. Just had pain medication and reports that it is helping with her pain at this time. Planning epidural in the future.  Objective: BP (!) 97/45   Pulse 76   Temp 98.1 F (36.7 C) (Oral)   Resp 18   Ht 5\' 6"  (1.676 m)   Wt 195 lb 6.4 oz (88.6 kg)   LMP 02/08/2017 (Approximate)   BMI 31.54 kg/m  No intake/output data recorded. No intake/output data recorded.  FHT:  FHR: 145 bpm, variability: moderate,  accelerations:  Abscent,  decelerations:  Absent UC:   regular, every 3 minutes SVE:   Dilation: 2 Effacement (%): Thick Station: Ballotable Exam by:: Dr. Nira Retortegele  Labs: Lab Results  Component Value Date   WBC 10.4 11/13/2017   HGB 12.3 11/13/2017   HCT 36.0 11/13/2017   MCV 92.1 11/13/2017   PLT 163 11/13/2017    Assessment / Plan: IOL 2/2 PROM, cervical ripening phase. Will consider pitocin PRN   Labor: cervical ripening/latent phase  Preeclampsia:  NA Fetal Wellbeing:  Category I Pain Control:  IV pain meds I/D:  n/a Anticipated MOD:  NSVD  Thressa ShellerHeather Corday Wyka 11/13/2017, 8:55 PM

## 2017-11-13 NOTE — Progress Notes (Signed)
38Subjective:    Gail Fields is a 32 y.o. [phone] 5085w5d being seen today for her possible rupture of membranes and  obstetrical visit.  Patient reports at 0345 thia am she had a gush of fluid that wet her legs and underwear.  None since but her panties have stayed wet. Fetal movement: normal.  Objective:    BP 104/60   Pulse 98   Wt 195 lb (88.5 kg)   LMP 02/08/2017 (Approximate)   BMI 31.00 kg/m   Physical Exam  Abdomen soft FHT 132 Exam  FHT:  132  Uterine Size:  38  Presentation:  vertex ballotable     SSE  No pooling, negatie nitrazine + ferning, essentially the entire slide had ferning present  Cx 2/th/soft/mid/ball  Assessment:    Pregnancy:  G4P1021 SROM @0345 , no labor    Plan:   I called Dr Adrian BlackwaterStinson and made him aware of patient's stauts.  [facility] and ripening/induction of labor with SROM at term   Patient Active Problem List   Diagnosis Date Noted  . Supervision of normal pregnancy 04/17/2017  . UTI (urinary tract infection) in pregnancy, antepartum 03/26/2017  . Dyspareunia due to medical condition in female 12/06/2016  . 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 Polyp at cervical os 10/13/2014  . Patellofemoral stress syndrome 10/13/2014     Follow up in 5 weeks for post partum exam.

## 2017-11-14 ENCOUNTER — Encounter (HOSPITAL_COMMUNITY): Payer: Self-pay | Admitting: *Deleted

## 2017-11-14 DIAGNOSIS — Z3A39 39 weeks gestation of pregnancy: Secondary | ICD-10-CM

## 2017-11-14 DIAGNOSIS — O4202 Full-term premature rupture of membranes, onset of labor within 24 hours of rupture: Secondary | ICD-10-CM

## 2017-11-14 DIAGNOSIS — O99824 Streptococcus B carrier state complicating childbirth: Secondary | ICD-10-CM

## 2017-11-14 LAB — RPR: RPR: NONREACTIVE

## 2017-11-14 MED ORDER — TETANUS-DIPHTH-ACELL PERTUSSIS 5-2.5-18.5 LF-MCG/0.5 IM SUSP: 0.5000 mL | Freq: Once | INTRAMUSCULAR | Status: DC

## 2017-11-14 MED ORDER — ZOLPIDEM TARTRATE 5 MG PO TABS: 5.0000 mg | ORAL_TABLET | Freq: Every evening | ORAL | Status: DC | PRN

## 2017-11-14 MED ORDER — WITCH HAZEL-GLYCERIN EX PADS: 1.0000 "application " | MEDICATED_PAD | CUTANEOUS | Status: DC | PRN

## 2017-11-14 MED ORDER — OXYTOCIN 40 UNITS IN LACTATED RINGERS INFUSION - SIMPLE MED
1.0000 m[IU]/min | INTRAVENOUS | Status: DC
  Administered 2017-11-14: 2 m[IU]/min via INTRAVENOUS

## 2017-11-14 MED ORDER — SENNOSIDES-DOCUSATE SODIUM 8.6-50 MG PO TABS
2.0000 | ORAL_TABLET | ORAL | Status: DC
  Administered 2017-11-15 – 2017-11-16 (×2): 2 via ORAL
  Filled 2017-11-14 (×2): qty 2

## 2017-11-14 MED ORDER — SIMETHICONE 80 MG PO CHEW: 80.0000 mg | CHEWABLE_TABLET | ORAL | Status: DC | PRN

## 2017-11-14 MED ORDER — OXYCODONE-ACETAMINOPHEN 5-325 MG PO TABS
1.0000 | ORAL_TABLET | ORAL | Status: DC | PRN
  Administered 2017-11-14 – 2017-11-16 (×7): 1 via ORAL
  Filled 2017-11-14 (×7): qty 1

## 2017-11-14 MED ORDER — BENZOCAINE-MENTHOL 20-0.5 % EX AERO
1.0000 "application " | INHALATION_SPRAY | CUTANEOUS | Status: DC | PRN
  Filled 2017-11-14: qty 56

## 2017-11-14 MED ORDER — ACETAMINOPHEN 325 MG PO TABS
650.0000 mg | ORAL_TABLET | ORAL | Status: DC | PRN
  Administered 2017-11-14: 650 mg via ORAL
  Filled 2017-11-14: qty 2

## 2017-11-14 MED ORDER — COCONUT OIL OIL
1.0000 "application " | TOPICAL_OIL | Status: DC | PRN
  Filled 2017-11-14: qty 120

## 2017-11-14 MED ORDER — MAGNESIUM HYDROXIDE 400 MG/5ML PO SUSP: 30.0000 mL | ORAL | Status: DC | PRN

## 2017-11-14 MED ORDER — DIPHENHYDRAMINE HCL 25 MG PO CAPS
25.0000 mg | ORAL_CAPSULE | Freq: Four times a day (QID) | ORAL | Status: DC | PRN
  Administered 2017-11-14: 25 mg via ORAL
  Filled 2017-11-14: qty 1

## 2017-11-14 MED ORDER — DOCUSATE SODIUM 100 MG PO CAPS
100.0000 mg | ORAL_CAPSULE | Freq: Two times a day (BID) | ORAL | Status: DC
  Administered 2017-11-14 – 2017-11-16 (×4): 100 mg via ORAL
  Filled 2017-11-14 (×4): qty 1

## 2017-11-14 MED ORDER — DIBUCAINE 1 % RE OINT
1.0000 "application " | TOPICAL_OINTMENT | RECTAL | Status: DC | PRN
  Filled 2017-11-14: qty 28

## 2017-11-14 MED ORDER — ONDANSETRON HCL 4 MG PO TABS: 4.0000 mg | ORAL_TABLET | ORAL | Status: DC | PRN

## 2017-11-14 MED ORDER — IBUPROFEN 600 MG PO TABS
600.0000 mg | ORAL_TABLET | Freq: Four times a day (QID) | ORAL | Status: DC
  Administered 2017-11-14 – 2017-11-16 (×11): 600 mg via ORAL
  Filled 2017-11-14 (×11): qty 1

## 2017-11-14 MED ORDER — OXYCODONE-ACETAMINOPHEN 5-325 MG PO TABS
2.0000 | ORAL_TABLET | ORAL | Status: DC | PRN
  Administered 2017-11-14: 2 via ORAL
  Filled 2017-11-14: qty 2

## 2017-11-14 MED ORDER — PRENATAL MULTIVITAMIN CH
1.0000 | ORAL_TABLET | Freq: Every day | ORAL | Status: DC
  Administered 2017-11-14 – 2017-11-16 (×3): 1 via ORAL
  Filled 2017-11-14 (×3): qty 1

## 2017-11-14 MED ORDER — ONDANSETRON HCL 4 MG/2ML IJ SOLN: 4.0000 mg | INTRAMUSCULAR | Status: DC | PRN

## 2017-11-14 NOTE — Lactation Note (Signed)
This note was copied from a baby's chart. Lactation Consultation Note  Patient Name: Gail Fields Reason for consult: Initial assessment;Term Breastfeeding consultation services and support information given and reviewed.  Newborn is 8 hours old.  Mom states she is having difficulty latching baby due to small mouth and large nipples.  Assisted with positioning baby in football hold.  Baby opens wide and latches to nipple only.  Baby suckles off and on and needs relatched often.  Discussed the possibility of pumping and bottle feeding if baby continues to have difficulty.  [place]IC in [place]  Referral faxed.  Maternal Data Has patient been taught Hand Expression?: Yes Does the patient have breastfeeding experience prior to this delivery?: Yes  Feeding Feeding Type: Breast Fed Length of feed: 10 min  LATCH Score Latch: Repeated attempts needed to sustain latch, nipple held in mouth throughout feeding, stimulation needed to elicit sucking reflex.  Audible Swallowing: A few with stimulation  Type of Nipple: Everted at rest and after stimulation  Comfort (Breast/Nipple): Soft / non-tender  Hold (Positioning): Assistance needed to correctly position infant at breast and maintain latch.  LATCH Score: 7  Interventions Interventions: Breast feeding basics reviewed;Assisted with latch;Breast compression;Skin to skin;Adjust position;Breast massage;Support pillows;Hand express;Position options  Lactation Tools Discussed/Used     Consult Status Consult Status: Follow-up Date: 11/15/17 Follow-up type: In-patient    Huston FoleyMOULDEN, [name], 2:18 PM

## 2017-11-14 NOTE — Anesthesia Postprocedure Evaluation (Signed)
Anesthesia Post Note  Patient: Jocelyn LamerLeslie Feldstein  Procedure(s) Performed: AN AD HOC LABOR EPIDURAL     Patient location during evaluation: Mother Baby Anesthesia Type: Epidural Level of consciousness: awake, awake and alert, oriented and patient cooperative Pain management: pain level controlled Vital Signs Assessment: post-procedure vital signs reviewed and stable Respiratory status: spontaneous breathing, nonlabored ventilation and respiratory function stable Cardiovascular status: stable Postop Assessment: no headache, no backache, epidural receding, patient able to bend at knees and no apparent nausea or vomiting Anesthetic complications: no    Last Vitals:  Vitals:   11/14/17 0815 11/14/17 0915  BP: (!) 121/54 (!) 109/50  Pulse: 76 68  Resp: 16 17  Temp: 36.6 C 36.8 C  SpO2: 100%     Last Pain:  Vitals:   11/14/17 1100  TempSrc:   PainSc: 2    Pain Goal:                 Durinda Buzzelli L

## 2017-11-15 ENCOUNTER — Encounter: Payer: BC Managed Care – PPO | Admitting: Advanced Practice Midwife

## 2017-11-15 MED ORDER — FLUCONAZOLE 150 MG PO TABS
150.0000 mg | ORAL_TABLET | Freq: Once | ORAL | Status: AC
  Administered 2017-11-15: 150 mg via ORAL
  Filled 2017-11-15: qty 1

## 2017-11-15 NOTE — Progress Notes (Addendum)
POSTPARTUM PROGRESS NOTE  Post Partum Day 1  Subjective:  Jocelyn LamerLeslie Garrelts is a 32 y.o. [id number] s/p VAVD for fetal bradycardia at 3049w6d.  No acute events overnight.  Pt denies problems with ambulating, voiding or po intake.  She denies nausea or vomiting.  Pain is well controlled.  She has had flatus. She has had bowel movement.  Lochia Small.   Objective: Blood pressure (!) 105/56, pulse 75, temperature 98 F (36.7 C), temperature source Oral, resp. rate 18, height 5\' 6"  (1.676 m), weight 88.6 kg (195 lb 6.4 oz), last menstrual period 02/08/2017, SpO2 99 %, unknown if currently breastfeeding.  Physical Exam:  General: alert, cooperative and no distress Chest: no respiratory distress Heart:regular rate, distal pulses intact Abdomen: soft, nontender,  Uterine Fundus: firm, appropriately tender DVT Evaluation: No calf swelling or tenderness Extremities: trace edema Skin: warm, dry  Recent Labs    11/13/17 1610  HGB 12.3  HCT 36.0    Assessment/Plan: Jocelyn LamerLeslie Seminara is a 32 y.o. [id number] s/p VAVD at 2449w6d   PPD#1 - Doing well Contraception: nexplanon Feeding: bottle & breast Dispo: Plan for discharge tomorrow.   LOS: 2 days   Alroy BailiffParker W Taressa Rauh, MD 11/15/2017, 12:36 PM

## 2017-11-16 ENCOUNTER — Other Ambulatory Visit: Payer: Self-pay

## 2017-11-16 MED ORDER — IBUPROFEN 600 MG PO TABS: 600.0000 mg | ORAL_TABLET | Freq: Four times a day (QID) | ORAL | 0 refills | Status: DC

## 2017-11-16 NOTE — Discharge Summary (Signed)
OB Discharge Summary     Patient Name: Gail Fields DOB: Dec 11, 1985 MRN: [id number]  Date of admission: 11/13/2017 Delivering MD: Levie HeritageSTINSON, JACOB J   Date of discharge: 11/16/2017  Admitting diagnosis: 39.5wks Induction Intrauterine pregnancy: 1142w6d     Secondary diagnosis:  Active Problems:   PROM (premature rupture of membranes)  Additional problems: none     Discharge diagnosis: Term Pregnancy Delivered                                                                                                Post partum procedures:none  Augmentation: Pitocin and Cytotec  Complications: None  Hospital course:  Onset of Labor With Vaginal Delivery     32 y.o. yo [id number] at 7642w6d was admitted in Latent Labor on 11/13/2017. Patient had an uncomplicated labor course as follows:  Membrane Rupture Time/Date: 3:45 AM ,11/13/2017   Intrapartum Procedures: Episiotomy: None [1]                                         Lacerations:  None [1]  Patient had a delivery of a Viable infant. 11/14/2017  Information for the patient's newborn:  Mallory ShirkLawson, Girl Twanda [[id number]][[id number]]       Pateint had an uncomplicated postpartum course.  She is ambulating, tolerating a regular diet, passing flatus, and urinating well. Patient is discharged home in stable condition on 11/16/17.   Physical exam  Vitals:   11/15/17 0535 11/15/17 1843 11/16/17 0500 11/16/17 0557  BP: (!) 105/56 (!) 108/43 (!) 115/59   Pulse: 75 65 68   Resp: 18 18 16    Temp: 98 F (36.7 C) 98 F (36.7 C) 98.2 F (36.8 C)   TempSrc: Oral Oral Oral   SpO2: 99%     Weight:    85.7 kg (189 lb)  Height:       General: alert, cooperative and no distress Lochia: appropriate Uterine Fundus: firm Incision: N/A DVT Evaluation: No evidence of DVT seen on physical exam. Negative Homan's sign. No cords or calf tenderness. Labs: Lab Results  Component Value Date   WBC 10.4 11/13/2017   HGB 12.3 11/13/2017   HCT 36.0 11/13/2017   MCV 92.1  11/13/2017   PLT 163 11/13/2017   No flowsheet data found.  Discharge instruction: per After Visit Summary and "Baby and Me Booklet".  After visit meds:  Allergies as of 11/16/2017   No Known Allergies     Medication List    STOP taking these medications   acetaminophen 500 MG tablet Commonly known as:  TYLENOL   docusate sodium 100 MG capsule Commonly known as:  COLACE   lidocaine 2 % jelly Commonly known as:  XYLOCAINE   lidocaine 5 % ointment Commonly known as:  XYLOCAINE   pantoprazole 20 MG tablet Commonly known as:  PROTONIX     TAKE these medications   ibuprofen 600 MG tablet Commonly known as:  ADVIL,MOTRIN Take 1 tablet (600 mg total) by mouth every 6 (six) hours.  prenatal vitamin w/FE, FA 27-1 MG Tabs tablet Take 1 tablet daily at 12 noon by mouth.       Diet: routine diet  Activity: Advance as tolerated. Pelvic rest for 6 weeks.   Outpatient follow up: Follow up Appt: Future Appointments  Date Time Provider Department Center  12/18/2017  1:30 PM Cresenzo-Dishmon, Scarlette Calico, CNM [facility]   Follow up Visit:No Follow-up on file.  Postpartum contraception: Depo Provera and IUD Mirena  Newborn Data: Live born female  Birth Weight: 6 lb 10.7 oz (3025 g) APGAR: 5, 9  Newborn Delivery   Birth date/time:  11/14/2017 05:40:00 Delivery type:  Vaginal, Vacuum (Extractor)     Baby Feeding: Breast Disposition:home with mother   11/16/2017 Greig Right, CNM

## 2017-11-16 NOTE — Discharge Instructions (Signed)
Postpartum Care After Vaginal Delivery °The period of time right after you deliver your newborn is called the postpartum period. °What kind of medical care will I receive? °· You may continue to receive fluids and medicines through an IV tube inserted into one of your veins. °· If an incision was made near your vagina (episiotomy) or if you had some vaginal tearing during delivery, cold compresses may be placed on your episiotomy or your tear. This helps to reduce pain and swelling. °· You may be given a squirt bottle to use when you go to the bathroom. You may use this until you are comfortable wiping as usual. To use the squirt bottle, follow these steps: °? Before you urinate, fill the squirt bottle with warm water. Do not use hot water. °? After you urinate, while you are sitting on the toilet, use the squirt bottle to rinse the area around your urethra and vaginal opening. This rinses away any urine and blood. °? You may do this instead of wiping. As you start healing, you may use the squirt bottle before wiping yourself. Make sure to wipe gently. °? Fill the squirt bottle with clean water every time you use the bathroom. °· You will be given sanitary pads to wear. °How can I expect to feel? °· You may not feel the need to urinate for several hours after delivery. °· You will have some soreness and pain in your abdomen and vagina. °· If you are breastfeeding, you may have uterine contractions every time you breastfeed for up to several weeks postpartum. Uterine contractions help your uterus return to its normal size. °· It is normal to have vaginal bleeding (lochia) after delivery. The amount and appearance of lochia is often similar to a menstrual period in the first week after delivery. It will gradually decrease over the next few weeks to a dry, yellow-brown discharge. For most women, lochia stops completely by 6-8 weeks after delivery. Vaginal bleeding can vary from woman to woman. °· Within the first few  days after delivery, you may have breast engorgement. This is when your breasts feel heavy, full, and uncomfortable. Your breasts may also throb and feel hard, tightly stretched, warm, and tender. After this occurs, you may have milk leaking from your breasts. Your health care provider can help you relieve discomfort due to breast engorgement. Breast engorgement should go away within a few days. °· You may feel more sad or worried than normal due to hormonal changes after delivery. These feelings should not last more than a few days. If these feelings do not go away after several days, speak with your health care provider. °How should I care for myself? °· Tell your health care provider if you have pain or discomfort. °· Drink enough water to keep your urine clear or pale yellow. °· Wash your hands thoroughly with soap and water for at least 20 seconds after changing your sanitary pads, after using the toilet, and before holding or feeding your baby. °· If you are not breastfeeding, avoid touching your breasts a lot. Doing this can make your breasts produce more milk. °· If you become weak or lightheaded, or you feel like you might faint, ask for help before: °? Getting out of bed. °? Showering. °· Change your sanitary pads frequently. Watch for any changes in your flow, such as a sudden increase in volume, a change in color, the passing of large blood clots. If you pass a blood clot from your vagina, save it   to show to your health care provider. Do not flush blood clots down the toilet without having your health care provider look at them. °· Make sure that all your vaccinations are up to date. This can help protect you and your baby from getting certain diseases. You may need to have immunizations done before you leave the hospital. °· If desired, talk with your health care provider about methods of family planning or birth control (contraception). °How can I start bonding with my baby? °Spending as much time as  possible with your baby is very important. During this time, you and your baby can get to know each other and develop a bond. Having your baby stay with you in your room (rooming in) can give you time to get to know your baby. Rooming in can also help you become comfortable caring for your baby. Breastfeeding can also help you bond with your baby. °How can I plan for returning home with my baby? °· Make sure that you have a car seat installed in your vehicle. °? Your car seat should be checked by a certified car seat installer to make sure that it is installed safely. °? Make sure that your baby fits into the car seat safely. °· Ask your health care provider any questions you have about caring for yourself or your baby. Make sure that you are able to contact your health care provider with any questions after leaving the hospital. °This information is not intended to replace advice given to you by your health care provider. Make sure you discuss any questions you have with your health care provider. °Document Released: 08/20/2007 Document Revised: 03/27/2016 Document Reviewed: 09/27/2015 °Elsevier Interactive Patient Education © 2018 [employer] ° °

## 2017-11-16 NOTE — Lactation Note (Signed)
This note was copied from a baby's chart. Lactation Consultation Note  Patient Name: Gail Fields JYNWG'NToday's Date: 11/16/2017 Reason for consult: Follow-up assessment;Difficult latch Mom states feedings are going well with using a 24 mm nipple shield.  Breasts are full this morning but not engorged.  Reviewed engorgement treatment.  Observed baby latch well to breast with a 24 mm nipple shield.  Reviewed good breast massage during feeding.  Baby actively feeding with a few swallows noted.  Mom has a manual pump and will call WIC today to obtain DEBP.   Lactation outpatient services and support information reviewed and encouraged.  Maternal Data    Feeding Feeding Type: Breast Fed  LATCH Score Latch: Grasps breast easily, tongue down, lips flanged, rhythmical sucking.(with 24 mm nipple shield)  Audible Swallowing: A few with stimulation  Type of Nipple: Everted at rest and after stimulation  Comfort (Breast/Nipple): Soft / non-tender  Hold (Positioning): Assistance needed to correctly position infant at breast and maintain latch.  LATCH Score: 8  Interventions Interventions: Breast feeding basics reviewed;Assisted with latch;Breast compression;Adjust position;Breast massage;Support pillows  Lactation Tools Discussed/Used Tools: Nipple Shields Nipple shield size: 24   Consult Status Consult Status: Complete    Tremain Rucinski S 11/16/2017, 8:51 AM

## 2017-11-17 ENCOUNTER — Ambulatory Visit: Payer: Self-pay

## 2017-11-17 NOTE — Lactation Note (Signed)
This note was copied from a baby's chart. Lactation Consultation Note  Patient Name: Gail Fields LKGMW'NToday's Date: 11/17/2017 Reason for consult: Follow-up assessment;Other (Comment)(pumping and bottle feeding - milk in and engorged - Ice provided ) 2nd  LC visit to check on engorgement and tx  Per mom had iced for 15 -20 mins ,felt better and jumped in the shower without pumping.  LC highly recommended pumping both breast for 15 -20 mins, and to call when she was ready  [facility]    Maternal Data Has patient been taught Hand Expression?: Yes(LC reviewed )  Feeding Feeding Type: Bottle Fed - Breast Milk Nipple Type: Slow - flow  LATCH Score                   Interventions Interventions: Breast feeding basics reviewed  Lactation Tools Discussed/Used Tools: Pump Breast pump type: Double-Electric Breast Pump [facility]IC Program: Yes([place] [facility]   Consult Status Consult Status: Follow-up Date: 11/17/17 Follow-up type: In-patient    Gail Fields 11/17/2017, 11:10 AM

## 2017-11-17 NOTE — Lactation Note (Signed)
This note was copied from a baby's chart. Lactation Consultation Note  Patient Name: Gail Fields YNWGN'FToday's Date: 11/17/2017 Reason for consult: Follow-up assessment;Other (Comment)(pumping and bottle feeding - milk in and engorged - Ice provided )  Baby is 75 hours  As LC entered the room - per mom breast are over full and engorged.  Ice provided for mom with instructions to ice for 15 - 20 mins while lying back and then pump  Both breast for 15 -20 mims.  MBURN aware and LC will check on mom.  [facility] and [facility] on Tuesday.  [facility]  And LC will recheck on mom.     Maternal Data Has patient been taught Hand Expression?: Yes(LC reviewed )  Feeding Feeding Type: Bottle Fed - Breast Milk Nipple Type: Slow - flow  LATCH Score                   Interventions Interventions: Breast feeding basics reviewed  Lactation Tools Discussed/Used Tools: Pump Breast pump type: Double-Electric Breast Pump WIC Program: Yes([facility]   Consult Status Consult Status: Follow-up Date: 11/17/17 Follow-up type: In-patient    Matilde SprangMargaret Ann Shakeel Disney 11/17/2017, 9:20 AM

## 2017-11-17 NOTE — Lactation Note (Signed)
This note was copied from a baby's chart. Lactation Consultation Note  Patient Name: Gail Fields ZOXWR'UToday's Date: 11/17/2017 Reason for consult: Follow-up assessment;Other (Comment)(pumping and bottle feeding - milk in and engorged - Ice provided )  [facility]rd LC visit [facility]. Per mom has appt. [facility] Tuesday to obtain a DEBP.  Mom receptive to coming [facility].  Mom aware she will receive a call from the clinic Monday .  Mother informed of post-discharge support and given phone number to the lactation department, including services for phone call assistance; out-patient appointments; and breastfeeding support group. List of other breastfeeding resources in the community given in the handout. Encouraged mother to call for problems or concerns related to breastfeeding.   Maternal Data Has patient been taught Hand Expression?: Yes(LC reviewed )  Feeding Feeding Type: Bottle Fed - Breast Milk Nipple Type: Slow - flow  LATCH Score                   Interventions Interventions: Breast feeding basics reviewed  Lactation Tools Discussed/Used Tools: Pump Breast pump type: Double-Electric Breast Pump WIC Program: Yes([place] [facility] )   Consult Status Consult Status: Follow-up Date: 11/17/17 Follow-up type: In-patient    Matilde SprangMargaret Ann Daphne Karrer 11/17/2017, 12:16 PM

## 2017-11-20 ENCOUNTER — Telehealth: Payer: Self-pay | Admitting: Obstetrics & Gynecology

## 2017-11-20 NOTE — Telephone Encounter (Signed)
Patient called with complaints of very painful hemorrhoids in which she has been using the lidocaine jelly, tucks, anusol and is not getting any relief.  States she is having trouble sitting as well.  States she was told she may need surgery on them.  Will get in later this week for evaluation.

## 2017-11-22 ENCOUNTER — Ambulatory Visit: Payer: Self-pay | Admitting: Advanced Practice Midwife

## 2017-11-26 ENCOUNTER — Ambulatory Visit (INDEPENDENT_AMBULATORY_CARE_PROVIDER_SITE_OTHER): Payer: BC Managed Care – PPO | Admitting: Women's Health

## 2017-11-26 ENCOUNTER — Encounter: Payer: Self-pay | Admitting: Women's Health

## 2017-11-26 VITALS — BP 120/72 | HR 67 | Ht 66.0 in | Wt 184.0 lb

## 2017-11-26 DIAGNOSIS — O8612 Endometritis following delivery: Secondary | ICD-10-CM

## 2017-11-26 DIAGNOSIS — B373 Candidiasis of vulva and vagina: Secondary | ICD-10-CM | POA: Diagnosis not present

## 2017-11-26 DIAGNOSIS — B3731 Acute candidiasis of vulva and vagina: Secondary | ICD-10-CM

## 2017-11-26 DIAGNOSIS — K649 Unspecified hemorrhoids: Secondary | ICD-10-CM

## 2017-11-26 MED ORDER — AMOXICILLIN-POT CLAVULANATE 875-125 MG PO TABS: 1.0000 | ORAL_TABLET | Freq: Two times a day (BID) | ORAL | 0 refills | Status: DC

## 2017-11-26 MED ORDER — TERCONAZOLE 0.4 % VA CREA: 1.0000 | TOPICAL_CREAM | Freq: Every day | VAGINAL | 0 refills | Status: DC

## 2017-11-26 MED ORDER — HYDROCORTISONE 2.5 % RE CREA: 1.0000 "application " | TOPICAL_CREAM | Freq: Two times a day (BID) | RECTAL | 0 refills | Status: DC

## 2017-11-26 NOTE — Progress Notes (Signed)
GYN VISIT Patient name: Gail Fields MRN [id number]  Date of birth: 22-May-1986 Chief Complaint:   Hemorrhoids  History of Present Illness:   Gail Fields is a 32 y.o. [id number] African American female 12d s/p VAVB being seen today for report of painful hemorrhoids. Currently using lidocaine jelly which isn't helping much. Also reports painful uterine cramping not relieved by ibuprofen and tylenol- states she is taking 1,200mg  ibuprofen q 6hrs!!! Thinks she has a yeast infection, reports vulvar itching. Also reports occ pain in chest with deep breaths or coughing. Denies sob, dizziness, heart racing, fever/chills, etc.      No LMP recorded. The current method of family planning is abstinence, plans POPs. Review of Systems:   Pertinent items are noted in HPI Denies fever/chills, dizziness, headaches, visual disturbances, fatigue, shortness of breath, chest pain, abdominal pain, vomiting, abnormal vaginal discharge/itching/odor/irritation, problems with periods, bowel movements, urination, or intercourse unless otherwise stated above.  Pertinent History Reviewed:  Reviewed past medical,surgical, social, obstetrical and family history.  Reviewed problem list, medications and allergies. Physical Assessment:   Vitals:   11/26/17 1019  BP: 120/72  Pulse: 67  SpO2: 98%  Weight: 184 lb (83.5 kg)  Height: 5\' 6"  (1.676 m)  Body mass index is 29.7 kg/m.       Physical Examination:   General appearance: alert, well appearing, and in no distress  Mental status: alert, oriented to person, place, and time  Skin: warm & dry   Cardiovascular: normal heart rate noted, HRRR  Respiratory: normal respiratory effort, no distress, LCTAB  Abdomen: soft, non-tender   Pelvic: spec exam cx appears normal, sm-mod amt malodorous lochia, no evidence of vaginal candida. No CMT, + uterine tenderness to palpation  Rectal: large non-thrombosed external hemorrhoid  Extremities: no edema   No results found for  this or any previous visit (from the past 24 hour(s)).  Assessment & Plan:  1) Endometritis> rx augmentin bid x 10d, cut back on ibuprofen! Discussed potential gastric irritation/damage w/ high doses  2) Hemorrhoid> rx anusol hc, can continue lidocaine jelly  3) Vulvar candida> rx terazol 7  4) Occ pain in chest> no distress, exam normal, low suspicion for PE as pulse ox 98% RA, not tachycardic. Discussed w/ LHE, who agrees. Advised pt if worsens, or develops new sx let us know or go to hospital for eval  Meds:  Meds ordered this encounter  Medications  . amoxicillin-clavulanate (AUGMENTIN) 875-125 MG tablet    Sig: Take 1 tablet by mouth 2 (two) times daily. X 10 days    Dispense:  20 tablet    Refill:  0    Order Specific Question:   Supervising Provider    Answer:   Despina Hidden, LUTHER H [[id number]]  . terconazole (TERAZOL 7) 0.4 % vaginal cream    Sig: Place 1 applicator vaginally at bedtime.    Dispense:  45 g    Refill:  0    Order Specific Question:   Supervising Provider    Answer:   EURE, LUTHER H [[id number]]  . hydrocortisone (ANUSOL-HC) 2.5 % rectal cream    Sig: Place 1 application rectally 2 (two) times daily.    Dispense:  30 g    Refill:  0    Order Specific Question:   Supervising Provider    Answer:   Despina Hidden, LUTHER H [[id number]]    No orders of the defined types were placed in this encounter.   Return for As scheduled for pp visit.  Grace Bushy,  Cheron EveryKimberly Randall CNM, [facility]-BC 11/26/2017 11:32 AM

## 2017-12-18 ENCOUNTER — Ambulatory Visit: Payer: BC Managed Care – PPO | Admitting: Advanced Practice Midwife

## 2017-12-23 ENCOUNTER — Emergency Department: Payer: BC Managed Care – PPO

## 2017-12-23 ENCOUNTER — Other Ambulatory Visit: Payer: Self-pay

## 2017-12-23 ENCOUNTER — Encounter: Payer: Self-pay | Admitting: Emergency Medicine

## 2017-12-23 ENCOUNTER — Emergency Department
Admission: EM | Admit: 2017-12-23 | Discharge: 2017-12-23 | Disposition: A | Discharge status: Home / Self Care | Payer: BC Managed Care – PPO | Attending: Emergency Medicine | Admitting: Emergency Medicine

## 2017-12-23 DIAGNOSIS — Z79899 Other long term (current) drug therapy: Secondary | ICD-10-CM | POA: Diagnosis not present

## 2017-12-23 DIAGNOSIS — R102 Pelvic and perineal pain: Secondary | ICD-10-CM | POA: Insufficient documentation

## 2017-12-23 DIAGNOSIS — H1031 Unspecified acute conjunctivitis, right eye: Secondary | ICD-10-CM | POA: Insufficient documentation

## 2017-12-23 DIAGNOSIS — H5789 Other specified disorders of eye and adnexa: Secondary | ICD-10-CM | POA: Diagnosis present

## 2017-12-23 LAB — CBC
HEMATOCRIT: 39.6 % (ref 35.0–47.0)
Hemoglobin: 13.1 g/dL (ref 12.0–16.0)
MCH: 30.2 pg (ref 26.0–34.0)
MCHC: 33.1 g/dL (ref 32.0–36.0)
MCV: 91.3 fL (ref 80.0–100.0)
Platelets: 160 10*3/uL (ref 150–440)
RBC: 4.34 MIL/uL (ref 3.80–5.20)
RDW: 12.8 % (ref 11.5–14.5)
WBC: 6.3 10*3/uL (ref 3.6–11.0)

## 2017-12-23 LAB — URINALYSIS, COMPLETE (UACMP) WITH MICROSCOPIC
BACTERIA UA: NONE SEEN
BILIRUBIN URINE: NEGATIVE
Glucose, UA: NEGATIVE mg/dL
Hgb urine dipstick: NEGATIVE
Ketones, ur: NEGATIVE mg/dL
Nitrite: NEGATIVE
PROTEIN: NEGATIVE mg/dL
Specific Gravity, Urine: 1.021 (ref 1.005–1.030)
pH: 6 (ref 5.0–8.0)

## 2017-12-23 LAB — COMPREHENSIVE METABOLIC PANEL
ALT: 16 U/L (ref 14–54)
AST: 22 U/L (ref 15–41)
Albumin: 4.2 g/dL (ref 3.5–5.0)
Alkaline Phosphatase: 50 U/L (ref 38–126)
Anion gap: 7 (ref 5–15)
BUN: 10 mg/dL (ref 6–20)
CHLORIDE: 106 mmol/L (ref 101–111)
CO2: 25 mmol/L (ref 22–32)
Calcium: 9.1 mg/dL (ref 8.9–10.3)
Creatinine, Ser: 0.62 mg/dL (ref 0.44–1.00)
GFR calc Af Amer: >60 mL/min (ref >60)
Glucose, Bld: 114 mg/dL — ABNORMAL HIGH (ref 65–99)
POTASSIUM: 3.5 mmol/L (ref 3.5–5.1)
SODIUM: 138 mmol/L (ref 135–145)
Total Bilirubin: 1.3 mg/dL — ABNORMAL HIGH (ref 0.3–1.2)
Total Protein: 7.6 g/dL (ref 6.5–8.1)

## 2017-12-23 LAB — LIPASE, BLOOD: LIPASE: 34 U/L (ref 11–51)

## 2017-12-23 MED ORDER — POLYMYXIN B-TRIMETHOPRIM 10000-0.1 UNIT/ML-% OP SOLN
1.0000 [drp] | Freq: Once | OPHTHALMIC | Status: AC
  Administered 2017-12-23: 1 [drp] via OPHTHALMIC
  Filled 2017-12-23: qty 10

## 2017-12-23 NOTE — Discharge Instructions (Signed)
Instill 2 drops into each eye  4 times per day for the next 7 days.  Return to the ER for new, worsening or persistent eye redness, eye pain, or any change in vision.  You should follow-up with your OB/GYN as scheduled.  Return to the ER for new, worsening, or persistent abdominal or pelvic pain, vaginal bleeding, weakness or lightheadedness, fevers, urinary symptoms, or any other new or worsening symptoms that concern you.

## 2017-12-23 NOTE — ED Provider Notes (Signed)
[facility] Emergency Department Provider Note ____________________________________________   First MD Initiated Contact with Patient 12/23/17 1252     (approximate)  I have reviewed the triage vital signs and the nursing notes.   HISTORY  Chief Complaint Eye Pain and Pelvic Pain    HPI Gail Fields is a 32 y.o. female with past medical history as noted below and status post vaginal delivery 5 weeks ago who presents with eye itching, acute onset approximately 2 days ago, bilateral, associated with redness in the right eye, and occurring after her 705-week-old child is having eye discharge which the patient has been rubbing out of the eyes in the morning.  The patient also reports pelvic pain, somewhat left-sided, occurring over the last several weeks.  The patient was evaluated for this last month at her OB practice, and discharged with Augmentin for possible endometritis.  She states that the symptoms have improved although she still gets intermittent and much less severe crampy pelvic pain.  She reports that her lochia has resolved and she has a small amount of clear discharge.  There is no nausea, vomiting, diarrhea, fever, or urinary symptoms.  Past Medical History:  Diagnosis Date  . Acute cystitis   . BV (bacterial vaginosis)   . Hemorrhoid   . Hx of chlamydia infection   . Patellofemoral stress syndrome   . 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 Trichimoniasis   . Vaginal Pap smear, abnormal 09/22/14   ASCUS- +HPV  . Yeast infection     Patient Active Problem List   Diagnosis Date Noted  . PROM (premature rupture of membranes) 11/13/2017  . Supervision of normal pregnancy 04/17/2017  . UTI (urinary tract infection) in pregnancy, antepartum 03/26/2017  . Dyspareunia due to medical condition in female 12/06/2016  . 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 Polyp at cervical os 10/13/2014  . Patellofemoral stress syndrome 10/13/2014    Past Surgical History:  Procedure Laterality Date  . NO PAST SURGERIES      Prior to Admission medications   Medication Sig Start Date End Date Taking? Authorizing Provider  acetaminophen (TYLENOL) 325 MG tablet Take 650 mg by mouth every 6 (six) hours as needed.    [provider]  amoxicillin-clavulanate (AUGMENTIN) 875-125 MG tablet Take 1 tablet by mouth 2 (two) times daily. X 10 days 11/26/17   Cheral MarkerBooker, Kimberly R, CNM  docusate sodium (COLACE) 100 MG capsule Take 100 mg by mouth 2 (two) times daily.    [provider]  hydrocortisone (ANUSOL-HC) 2.5 % rectal cream Place 1 application rectally 2 (two) times daily. 11/26/17   Cheral MarkerBooker, Kimberly R, CNM  ibuprofen (ADVIL,MOTRIN) 600 MG tablet Take 1 tablet (600 mg total) by mouth every 6 (six) hours. 11/16/17   Cresenzo-Dishmon, Scarlette CalicoFrances, CNM  lidocaine (XYLOCAINE) 5 % ointment  10/02/17   [provider]  prenatal vitamin w/FE, FA (PRENATAL 1 + 1) 27-1 MG TABS tablet Take 1 tablet daily at 12 noon by mouth. 09/18/17   Lazaro ArmsEure, Luther H, MD  terconazole (TERAZOL 7) 0.4 % vaginal cream Place 1 applicator vaginally at bedtime. 11/26/17   Cheral MarkerBooker, Kimberly R, CNM    Allergies Patient has no known allergies.  Family History  Problem Relation Age of Onset  . Cancer Maternal Grandmother        breast  . Seizures Paternal Grandmother     Social History  Social History   Tobacco Use  . Smoking status: Never Smoker  . Smokeless tobacco: Never Used  Substance Use Topics  . Alcohol use: No  . Drug use: No    Review of Systems  Constitutional: No fever. Eyes: Positive for right eye redness and bilateral itching. ENT: No sore throat. Cardiovascular: Denies chest pain. Respiratory: Denies shortness of breath. Gastrointestinal: No nausea, no vomiting.  No diarrhea.  Genitourinary: Negative for dysuria.  Musculoskeletal:  Negative for back pain. Skin: Negative for rash. Neurological: Negative for headache.   ____________________________________________   PHYSICAL EXAM:  VITAL SIGNS: ED Triage Vitals  Enc Vitals Group     BP 12/23/17 1205 (!) 113/59     Pulse Rate 12/23/17 1205 82     Resp 12/23/17 1205 16     Temp 12/23/17 1205 98.8 F (37.1 C)     Temp Source 12/23/17 1205 Oral     SpO2 --      Weight 12/23/17 1207 180 lb (81.6 kg)     Height 12/23/17 1207 5\' 6"  (1.676 m)     Head Circumference --      Peak Flow --      Pain Score 12/23/17 1206 7     Pain Loc --      Pain Edu? --      Excl. in GC? --     Constitutional: Alert and oriented. Well appearing and in no acute distress. Eyes: Right conjunctiva is erythematous, left is normal.  EOMI.  PERRLA. Head: Atraumatic. Nose: No congestion/rhinnorhea. Mouth/Throat: Mucous membranes are moist.  Oropharynx clear with no exudates. Neck: Normal range of motion.  Cardiovascular: Good peripheral circulation. Respiratory: Normal respiratory effort.   Gastrointestinal: Soft with minimal suprapubic tenderness. No distention.  Genitourinary: No CVA tenderness. Musculoskeletal:  Extremities warm and well perfused.  Neurologic:  Normal speech and language. No gross focal neurologic deficits are appreciated.  Skin:  Skin is warm and dry. No rash noted. Psychiatric: Mood and affect are normal. Speech and behavior are normal.  ____________________________________________   LABS (all labs ordered are listed, but only abnormal results are displayed)  Labs Reviewed  COMPREHENSIVE METABOLIC PANEL - Abnormal; Notable for the following components:      Result Value   Glucose, Bld 114 (*)    Total Bilirubin 1.3 (*)    All other components within normal limits  URINALYSIS, COMPLETE (UACMP) WITH MICROSCOPIC - Abnormal; Notable for the following components:   Color, Urine YELLOW (*)    APPearance CLEAR (*)    Leukocytes, UA TRACE (*)    Squamous  Epithelial / LPF 0-5 (*)    All other components within normal limits  LIPASE, BLOOD  CBC  PREGNANCY, URINE   ____________________________________________  EKG   ____________________________________________  RADIOLOGY  US pelvis: No significant acute findings  ____________________________________________   PROCEDURES  Procedure(s) performed: No  Procedures  Critical Care performed: No ____________________________________________   INITIAL IMPRESSION / ASSESSMENT AND PLAN / ED COURSE  Pertinent labs & imaging results that were available during my care of the patient were reviewed by me and considered in my medical decision making (see chart for details).  32 year old female with past medical history as noted above and status post vaginal delivery 5 weeks ago presents primarily for right eye redness and bilateral itching over the last few days, with similar symptoms in her 71-week-old, as well as for ongoing intermittent pelvic pain for the last several weeks which has improved but not completely resolved after  she took Augmentin for possible endometritis.  I reviewed the past medical records in epic, and confirmed that the patient was [facility] and started on Augmentin for possible endometritis.  She had a pelvic exam which revealed mild lochia did not have imaging at that time.  I also confirmed the patient had a negative GC CT probe in December.  On exam today, the patient is extremely well-appearing, vital signs are normal, and the remainder the exam is unremarkable except for right eye conjunctival inflammation.  The abdomen is soft with minimal suprapubic discomfort.  In terms of the eye symptoms, presentation is consistent with likely viral conjunctivitis however I will treat empirically with Polytrim drops.  Differential for the pelvic pain includes ovarian cyst, endometriosis, fibroids, or likely other benign cause.  There is no clinical evidence  of PID or other infection.  We will obtain labs, UA, and an ultrasound to further evaluate.  Anticipate discharge home if negative.    ----------------------------------------- 3:07 PM on 12/23/2017 -----------------------------------------  Ultrasound is unremarkable, and the UA and remainder of lab workup are within normal limits.  Patient continues to appear comfortable on reassessment.  There is no indication for additional workup or treatment for the pelvic pain.  Patient has follow-up arranged in approximately 1 week at her OB/GYN and I instructed her to keep this appointment.  We will discharge with Polytrim eyedrops and instructions for use.  Return precautions given both for the conjunctivitis and the pelvic pain, and the patient expresses understanding. ____________________________________________   FINAL CLINICAL IMPRESSION(S) / ED DIAGNOSES  Final diagnoses:  Pelvic pain  Acute conjunctivitis of right eye, unspecified acute conjunctivitis type      NEW MEDICATIONS STARTED DURING THIS VISIT:  New Prescriptions   No medications on file     Note:  This document was prepared using Dragon voice recognition software and may include unintentional dictation errors.    Dionne Bucy, MD 12/23/17 [phone]

## 2017-12-23 NOTE — ED Triage Notes (Addendum)
Pt in via POV with complaints of pain/itching to right eye x 3 days, redness noted to eye.  Pt also complains of pelvic pain since recent pregnancy; reports following up with OB, diagnosed with PID and prescribed Amoxicillin which she has finished.  Pt reports pain is still unresolved.  Vitals WDL, NAD noted at this time.

## 2017-12-23 NOTE — ED Notes (Signed)
Pt taken to ultrasound via stretcher.

## 2018-01-01 ENCOUNTER — Ambulatory Visit: Payer: BC Managed Care – PPO | Admitting: Advanced Practice Midwife

## 2018-01-14 ENCOUNTER — Encounter: Payer: Self-pay | Admitting: Women's Health

## 2018-01-14 ENCOUNTER — Ambulatory Visit (INDEPENDENT_AMBULATORY_CARE_PROVIDER_SITE_OTHER): Payer: BC Managed Care – PPO | Admitting: Women's Health

## 2018-01-14 DIAGNOSIS — R300 Dysuria: Secondary | ICD-10-CM

## 2018-01-14 DIAGNOSIS — K5900 Constipation, unspecified: Secondary | ICD-10-CM | POA: Diagnosis not present

## 2018-01-14 DIAGNOSIS — K644 Residual hemorrhoidal skin tags: Secondary | ICD-10-CM | POA: Diagnosis not present

## 2018-01-14 DIAGNOSIS — B373 Candidiasis of vulva and vagina: Secondary | ICD-10-CM

## 2018-01-14 LAB — POCT URINALYSIS DIPSTICK
Glucose, UA: NEGATIVE
Leukocytes, UA: NEGATIVE
NITRITE UA: NEGATIVE
PROTEIN UA: NEGATIVE

## 2018-01-14 MED ORDER — FLUCONAZOLE 150 MG PO TABS: 150.0000 mg | ORAL_TABLET | Freq: Once | ORAL | 0 refills | Status: AC

## 2018-01-14 MED ORDER — MEDROXYPROGESTERONE ACETATE 150 MG/ML IM SUSP: 150.0000 mg | INTRAMUSCULAR | 3 refills | Status: DC

## 2018-01-14 NOTE — Progress Notes (Signed)
POSTPARTUM VISIT Patient name: Gail Fields MRN [id number]  Date of birth: 03-02-1986 Chief Complaint:   postpartum visit (discuss birth control options; vaginal itching with discharge; burning with urination )  History of Present Illness:   Gail Fields is a 32 y.o. [phone] African American female being seen today for a postpartum visit. She is 8 weeks postpartum following a vacuum, low at 39.6 gestational weeks. Anesthesia: epidural. I have fully reviewed the prenatal and intrapartum course. Pregnancy uncomplicated. Postpartum course has been uncomplicated. Bleeding no bleeding. Bowel function is constipation/hemorrhoids, requests referral to GI. Bladder function is burning w/ urination, no increased frequency.  Patient is sexually active. Last sexual activity: 3/9.  Contraception method is coitus interruptus, wants depo for now [place] Postpartum Depression Screening: negative. Score 2.   Last pap 04/17/17.  Results were neg pap w/ +HRHPV .  Patient's last menstrual period was 12/24/2017 (approximate).  Baby's course has been uncomplicated. Baby is feeding by breast.  Review of Systems:   Pertinent items are noted in HPI Denies Abnormal vaginal discharge w/ itching/odor/irritation, headaches, visual changes, shortness of breath, chest pain, abdominal pain, severe nausea/vomiting, or problems with urination or bowel movements. Pertinent History Reviewed:  Reviewed past medical,surgical, obstetrical and family history.  Reviewed problem list, medications and allergies. OB History  Gravida Para Term Preterm AB Living  4 2 2   2 2   SAB TAB Ectopic Multiple Live Births  1 1   0 2    # Outcome Date GA Lbr Len/2nd Weight Sex Delivery Anes PTL Lv  4 Term 11/14/17 4423w6d 07:47 / 01:23 6 lb 10.7 oz (3.025 kg) F Vag-Vacuum EPI  LIV  3 SAB 11/20/16          2 Term 08/19/15 5830w3d 11:55 / 04:36 7 lb 9.5 oz (3.445 kg) M Vag-Vacuum EPI  LIV  1 TAB 2011             Physical Assessment:    Vitals:   01/14/18 1411  BP: 120/60  Pulse: 78  Weight: 165 lb (74.8 kg)  Height: 5\' 6"  (1.676 m)  Body mass index is 26.63 kg/m.       Physical Examination:   General appearance: alert, well appearing, and in no distress  Mental status: alert, oriented to person, place, and time  Skin: warm & dry   Cardiovascular: normal heart rate noted   Respiratory: normal respiratory effort, no distress   Breasts: deferred, no complaints   Abdomen: soft, non-tender   Pelvic: VULVA: normal appearing vulva with no masses, tenderness or lesions, UTERUS: uterus is normal size, shape, consistency and nontender; thick clumpy white nonodorous d/c c/w yeast  Rectal: external non-thrombosed hemorrhoids  Extremities: no edema       Results for orders placed or performed in visit on 01/14/18 (from the past 24 hour(s))  POCT urinalysis dipstick   Collection Time: 01/14/18  2:25 PM  Result Value Ref Range   Color, UA     Clarity, UA     Glucose, UA neg    Bilirubin, UA     Ketones, UA trace    Spec Grav, UA  1.010 - 1.025   Blood, UA trace    pH, UA  5.0 - 8.0   Protein, UA neg    Urobilinogen, UA  0.2 or 1.0 E.U./dL   Nitrite, UA neg    Leukocytes, UA Negative Negative   Appearance     Odor      Assessment & Plan:  1) Postpartum exam 2) 8 wks s/p VAVB 3) Breastfeeding 4) Depression screening 5) Contraception counseling, pt prefers abstinence until after depo, then condoms x 2wks  6) Vaginal candida> rx diflucan 7) Constipation/hemorrhoids> GI referral placed per pt request 8) Burning w/ urination> urine cx sent  Meds:  Meds ordered this encounter  Medications  . fluconazole (DIFLUCAN) 150 MG tablet    Sig: Take 1 tablet (150 mg total) by mouth once for 1 dose. Take 1 pill now, may take 2nd pill in 3 days if needed    Dispense:  2 tablet    Refill:  0    Order Specific Question:   Supervising Provider    Answer:   Despina Hidden, LUTHER H [[id number]]  . medroxyPROGESTERone (DEPO-PROVERA) 150  MG/ML injection    Sig: Inject 1 mL (150 mg total) into the muscle every 3 (three) months.    Dispense:  1 mL    Refill:  3    Order Specific Question:   Supervising Provider    Answer:   Lazaro Arms [[id number]]    Follow-up: Return for 3/21 for depo, then after 6/12 for pap & physical.   Orders Placed This Encounter  Procedures  . Urine Culture  . Ambulatory referral to Gastroenterology  . POCT urinalysis dipstick    Cheral Marker CNM, [facility] 01/14/2018 2:45 PM

## 2018-01-15 ENCOUNTER — Ambulatory Visit: Payer: Self-pay

## 2018-01-15 NOTE — Lactation Note (Signed)
This note was copied from a baby's chart. 01/04/2018  Name: Micheal Likens MRN: [id number] Date of Birth: 11/14/2018 Gestational Age: Gestational Age: [redacted]w[redacted]d Birth Weight: 106.7 oz Weight today:    9 pounds 1.7 ounces (4130 grams) with clean size 1 diaper   Gail Fields presents today for follow up feeding assessment.   Mom reports infant is not latching, she is pumping and has a great milk supply.   We attempted to latch infant to the breast with and without a NS, she refused to eat and began crying. Mom with soft compressible breasts with large long nipples. Later infant did latch for a few suckles, mom was tender with feeding and nipple was slightly compressed with feeding.   Mom reports infant is feeding 6-8 x a day with Dr. Theora Gianotti Level Preemie nipple. Mom reports infant is usually not a good eater and about a week ago she weighed 8 pounds. Infant gained a pound in the last week and mom feels she has been going through a growth spurt.   Infant with thick labial frenulum with inserts at the bottom of the gum ridge with a divot to the center of the gum ridge. Infant with tongue thrusting and posterior lingual frenulum noted. Infant with poor tongue extension and mid tongue elevation. She has good tongue lateralization. Infant with tight oral tone. Infant pulls tongue back behind gumline when suckling on gloved finger. Infant has difficulty keeping a bottle nipple in her mouth also. Infant tends to get sleepy with feeding and takes a while to feed. Mom asked for information on [facility] Specialists. Mom was given information for Dr. Lexine Baton since she is Medicaid participant. Mom aware Dr. Lexine Baton out of town.   Mom is pumping and has plenty of milk. Mom has a lot stored in the freezer. Enc mom to start using some of her older frozen milk. Mom is having some pain with pumping, mostly on the left nipple. Size 30 flange given for mom to try. Mom is using coconut oil prior to pumping. Showed mom how  nipple should fit in the nipple shield.   Mom aware of BF Support Groups. Mom to return to [facility] in May. Mom to call and follow up with Lactation 1-2 days post revisions if completed.   Mom to return to work in March. Talked about pumping and returning to work.   Mom reports all questions/concerns have been answered. Mom reports she feels she is doing well. Mom reports she has good support at home.    General Information: Mother's reason for visit: follow up lactation appt Consult: Follow-up Lactation consultant: Noralee Stain RN,IBCLC Breastfeeding experience: not latching, mom is pumping and bottle feeding Maternal medications: Pre-natal vitamin, Stool softener  Breastfeeding History: Frequency of breast feeding: o  Supplementation: Supplement method: bottle(Dr. Brown's Preemie/Level 1 nipple) Breast milk volume: 2-3 ounces Breast milk frequency: 6-8 x a day Pump type: Medela pump in style Pump frequency: 7-8 x a day Pump volume: 8-12 ounces  Infant Output Assessment: Voids per 24 hours: 7-8+ Urine color: Clear yellow Stools per 24 hours: 0-3 days Stool color: Yellow  Breast Assessment: Breast: Soft, Compressible Nipple: Erect Pain level: 0 Pain interventions: Bra  Feeding Assessment: Infant oral assessment: Variance Infant oral assessment comment: Infant with thick labial frenulum with inserts at the bottom of the gum ridge with a divot to the center of the gum ridge. Infant with tongue thrusting and posterior lingual frenulum noted. Infant with poor tongue extension and mid tongue elevation.  She has good tongue lateralization. Infant with tight oral tone. Infant pulls tongue back behind gumline when suckling on gloved finger. Infant has difficulty keeping a bottle nipple in her mouth also. Infant tends to get sleepy with feeding and takes a while to feed. Mom asked for information on [facility]. Mom was given information for Dr. Lexine BatonHisaw since she is Medicaid  participant. Mom aware Dr. Lexine BatonHisaw out of town.  Positioning: Forensic psychologistCross cradle Latch: 0 - Too sleepy or reluctant, no latch achieved, no sucking elicited. Audible swallowing: 0 - None Type of nipple: 2 - Everted at rest and after stimulation Comfort: 1 - Filling, red/small blisters or bruises, mild/mod discomfort Hold: 1 - Assistance needed to correctly position infant at breast and maintain latch LATCH score: 4 Latch assessment: Shallow Lips flanged: Yes Suck assessment: Displays both Pre-feed weight: 4130 grams Amount transferred: 0 Amount supplemented: 2 ounces  Totals: Total amount transferred: 0 Total supplement given: 60 ml   1. Continue to offer infant bottle of pumped breast milk with feeding cues, enc infant to feed 8 x a day if possible 2. Try to offer the infant the breast when she is calm.  3. Gail Fields needs 77 ml-103 ml (2.5 ounces-3.5 ounces) for 8 feedings a day.  4. Try feeding infant in a calm or dark room away from distractions. 5. Try Gail Fields on the Dr. Theora GianottiBrown's Level 1 nipple 6. Continue pumping 7-8 x a day for 15-20 minutes 7. Continue tummy time 3 x a day for 10 minutes when infant awake and when parent present 8. Keep up with good work 9. Call for assistance as needed [phone] 10. Thanks you for allowing me to assist you today 11. Return to see LC 1-2 days post tongue/lip revisions       [facility] Mikyle Sox RN, [employer]CLC

## 2018-01-16 LAB — URINE CULTURE

## 2018-01-17 ENCOUNTER — Encounter: Payer: Self-pay | Admitting: Women's Health

## 2018-01-18 ENCOUNTER — Other Ambulatory Visit: Payer: Self-pay | Admitting: Women's Health

## 2018-01-18 MED ORDER — FLUCONAZOLE 150 MG PO TABS: 150.0000 mg | ORAL_TABLET | Freq: Once | ORAL | 0 refills | Status: AC

## 2018-01-21 ENCOUNTER — Encounter: Payer: Self-pay | Admitting: Internal Medicine

## 2018-01-24 ENCOUNTER — Ambulatory Visit: Payer: BC Managed Care – PPO

## 2018-01-24 ENCOUNTER — Other Ambulatory Visit: Payer: BC Managed Care – PPO

## 2018-01-30 DIAGNOSIS — Z029 Encounter for administrative examinations, unspecified: Secondary | ICD-10-CM

## 2018-02-04 ENCOUNTER — Ambulatory Visit (INDEPENDENT_AMBULATORY_CARE_PROVIDER_SITE_OTHER): Payer: BC Managed Care – PPO | Admitting: *Deleted

## 2018-02-04 ENCOUNTER — Other Ambulatory Visit: Payer: BC Managed Care – PPO

## 2018-02-04 ENCOUNTER — Encounter: Payer: Self-pay | Admitting: *Deleted

## 2018-02-04 DIAGNOSIS — Z3042 Encounter for surveillance of injectable contraceptive: Secondary | ICD-10-CM

## 2018-02-04 DIAGNOSIS — Z308 Encounter for other contraceptive management: Secondary | ICD-10-CM

## 2018-02-04 LAB — BETA HCG QUANT (REF LAB)

## 2018-02-04 MED ORDER — MEDROXYPROGESTERONE ACETATE 150 MG/ML IM SUSP
150.0000 mg | Freq: Once | INTRAMUSCULAR | Status: AC
  Administered 2018-02-04: 150 mg via INTRAMUSCULAR

## 2018-02-04 NOTE — Progress Notes (Signed)
Pt here for Depo. Pt had neg quant this am. Pt tolerated shot well. Return in 12 weeks for next shot. JSY

## 2018-02-18 ENCOUNTER — Encounter: Payer: Self-pay | Admitting: Women's Health

## 2018-02-25 ENCOUNTER — Other Ambulatory Visit: Payer: Self-pay

## 2018-02-25 ENCOUNTER — Encounter: Payer: Self-pay | Admitting: Obstetrics & Gynecology

## 2018-02-25 ENCOUNTER — Ambulatory Visit (INDEPENDENT_AMBULATORY_CARE_PROVIDER_SITE_OTHER): Payer: BC Managed Care – PPO | Admitting: Obstetrics & Gynecology

## 2018-02-25 VITALS — BP 118/70 | HR 71 | Ht 66.5 in | Wt 165.0 lb

## 2018-02-25 DIAGNOSIS — K644 Residual hemorrhoidal skin tags: Secondary | ICD-10-CM

## 2018-02-25 MED ORDER — NITROGLYCERIN 2 % TD OINT: 0.5000 [in_us] | TOPICAL_OINTMENT | Freq: Four times a day (QID) | TRANSDERMAL | 1 refills | Status: DC

## 2018-02-25 NOTE — Progress Notes (Signed)
Chief Complaint  Patient presents with  . Hemorrhoids      32 y.o. [id number] Patient's last menstrual period was 02/23/2018. The current method of family planning is Depo-Provera injections.  Outpatient Encounter Medications as of 02/25/2018  Medication Sig  . acetaminophen (TYLENOL) 325 MG tablet Take 650 mg by mouth every 6 (six) hours as needed.  . docusate sodium (COLACE) 100 MG capsule Take 100 mg by mouth 2 (two) times daily.  [name]. ibuprofen (ADVIL,MOTRIN) 600 MG tablet Take 1 tablet (600 mg total) by mouth every 6 (six) hours. (Patient taking differently: Take 600 mg by mouth as needed. )  . lidocaine (XYLOCAINE) 5 % ointment as needed.   . medroxyPROGESTERone (DEPO-PROVERA) 150 MG/ML injection Inject 1 mL (150 mg total) into the muscle every 3 (three) months.  . prenatal vitamin w/FE, FA (PRENATAL 1 + 1) 27-1 MG TABS tablet Take 1 tablet daily at 12 noon by mouth.  . nitroGLYCERIN (NITROGLYN) 2 % ointment Apply 0.5 inches topically 4 (four) times daily.   Facility-Administered Encounter Medications as of 02/25/2018  Medication  . lidocaine 2 % w/epi 1:200,000 (20 ml) with sod bicarb 8.4 % (2 ml) inj    Subjective On going problem with exernal hemorrhoids  Has appt in 4 weks at GI Concerned about being thrombosed on phone Episodic problem after straining, could hardly sit a week or two ago Unresponsive to lidocaine jelly Past Medical History:  Diagnosis Date  . Acute cystitis   . BV (bacterial vaginosis)   . Hemorrhoid   . Hx of chlamydia infection   . Patellofemoral stress syndrome   . 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 Trichimoniasis   . Vaginal Pap smear, abnormal 09/22/14   ASCUS- +HPV  . Yeast infection     Past Surgical History:  Procedure Laterality Date  . NO PAST SURGERIES      OB History    Gravida  4   Para  2   Term  2   Preterm      AB  2   Living  2     SAB  1   TAB  1   Ectopic      Multiple  0   Live Births  2           No  Known Allergies  Social History   Socioeconomic History  . Marital status: Single    Spouse name: Not on file  . Number of children: Not on file  . Years of education: Not on file  . Highest education level: Not on file  Occupational History  . Not on file  Social Needs  . Financial resource strain: Not on file  . Food insecurity:    Worry: Not on file    Inability: Not on file  . Transportation needs:    Medical: Not on file    Non-medical: Not on file  Tobacco Use  . Smoking status: Never Smoker  . Smokeless tobacco: Never Used  Substance and Sexual Activity  . Alcohol use: No  . Drug use: No  . Sexual activity: Yes    Birth control/protection: Injection  Lifestyle  . Physical activity:    Days per week: Not on file    Minutes per session: Not on file  . Stress: Not on file  Relationships  . Social connections:    Talks on phone: Not on file    Gets together: Not on file  Attends religious service: Not on file    Active member of club or organization: Not on file    Attends meetings of clubs or organizations: Not on file    Relationship status: Not on file  Other Topics Concern  . Not on file  Social History Narrative  . Not on file    Family History  Problem Relation Age of Onset  . Cancer Maternal Grandmother        breast  . Seizures Paternal Grandmother     Medications:       Current Outpatient Medications:  .  acetaminophen (TYLENOL) 325 MG tablet, Take 650 mg by mouth every 6 (six) hours as needed., Disp: , Rfl:  .  docusate sodium (COLACE) 100 MG capsule, Take 100 mg by mouth 2 (two) times daily., Disp: , Rfl:  .  ibuprofen (ADVIL,MOTRIN) 600 MG tablet, Take 1 tablet (600 mg total) by mouth every 6 (six) hours. (Patient taking differently: Take 600 mg by mouth as needed. ), Disp: 30 tablet, Rfl: 0 .  lidocaine (XYLOCAINE) 5 % ointment, as needed. , Disp: , Rfl:  .  medroxyPROGESTERone (DEPO-PROVERA) 150 MG/ML injection, Inject 1 mL (150 mg total)  into the muscle every 3 (three) months., Disp: 1 mL, Rfl: 3 .  prenatal vitamin w/FE, FA (PRENATAL 1 + 1) 27-1 MG TABS tablet, Take 1 tablet daily at 12 noon by mouth., Disp: 30 each, Rfl: 12 .  nitroGLYCERIN (NITROGLYN) 2 % ointment, Apply 0.5 inches topically 4 (four) times daily., Disp: 30 g, Rfl: 1 No current facility-administered medications for this visit.   Facility-Administered Medications Ordered in Other Visits:  .  lidocaine 2 % w/epi 1:200,000 (20 ml) with sod bicarb 8.4 % (2 ml) inj, , , Continuous PRN, Lorn Junes, Debra R, CRNA, 5 mL at 08/18/15 2327  Objective Blood pressure 118/70, pulse 71, height 5' 6.5" (1.689 m), weight 165 lb (74.8 kg), last menstrual period 02/23/2018, currently breastfeeding.  Gen WDWN NAD External deflated hemorrhoids present not thrombosed at all  Pertinent ROS No burning with urination, frequency or urgency No nausea, vomiting or diarrhea Nor fever chills or other constitutional symptoms   Labs or studies     Impression Diagnoses this Encounter::   ICD-10-CM   1. External hemorrhoids K64.4     Established relevant diagnosis(es): Postpartum 11/2017  Plan/Recommendations: Meds ordered this encounter  Medications  . nitroGLYCERIN (NITROGLYN) 2 % ointment    Sig: Apply 0.5 inches topically 4 (four) times daily.    Dispense:  30 g    Refill:  1    Labs or Scans Ordered: No orders of the defined types were placed in this encounter.   Management:: Nitroglycerin ointment prn Keep appt with GI for possible banding  Follow up Return if symptoms worsen or fail to improve.     All questions were answered.

## 2018-02-28 ENCOUNTER — Telehealth: Payer: Self-pay | Admitting: *Deleted

## 2018-02-28 NOTE — Telephone Encounter (Signed)
Pt called stating that she needed a prior authorization for the nitroglycerin ointment that she was prescribed. Advised pt that I would look into the approval for her and let her know the outcome. Pt verbalized understanding.

## 2018-03-01 ENCOUNTER — Telehealth: Payer: Self-pay | Admitting: *Deleted

## 2018-03-01 NOTE — Telephone Encounter (Signed)
Pt's insurance don't cover Nitro-Bid 2% ointment. I called [facility] and was given a preferred drug list. Nitroglycerin spray 2 sprays TID prn was ordered by Dr. Despina HiddenEure. I called med into [facility]. Left message letting pt know. JSY

## 2018-03-18 ENCOUNTER — Ambulatory Visit: Payer: Self-pay | Admitting: Surgery

## 2018-03-18 NOTE — H&P (Signed)
Gail Fields Documented: 03/18/2018 2:42 PM Location: [facility] Patient #: [id number] DOB: 1986/06/23 Single / Language: [place] / Race: Black or African American Female   History of Present Illness Gail Sportsman MD; 03/18/2018 3:26 PM) The patient is a 32 year old female who presents with hemorrhoids. Note for "Hemorrhoids": ` ` ` Patient sent for surgical consultation at the request of Dr. Duane Lope  Chief Complaint: Worsening hemorrhoids  The patient is a young woman status post vaginal delivery November 14, 2017. She had some intermittent hemorrhoid problems. They've been a persistent problem postpartum. Discuss with her OB/GYN. External hemorrhoids noted. Sent for consultation with gastroenterology apparently for banding. Redirected to surgery. BM 2x a week on Colace. Was on Miralax in past. Feels "bubbles around my butthole." PAin w defectaion & prolonged sitting. No help with Anusol cream. No help with nitroglycerin cream. She moves her bowels twice a week at best. Claims MiraLAX didn't work for her well but only took 1 or 2 doses a day. No family history of bowel problems. She does not smoke. She can walk several miles without difficulty. No history of diabetes or other peripartum issues. She's never had any anorectal interventions. This hemorrhoid problem started with her first pregnancy of her son in 2016. Things seemed to improve a little bit but then flare back up with this second pregnancy of her daughter that she delivered earlier this year.  No personal nor family history of GI/colon cancer, inflammatory bowel disease, irritable bowel syndrome, allergy such as Celiac Sprue, dietary/dairy problems, colitis, ulcers nor gastritis. No recent sick contacts/gastroenteritis. No travel outside the country. No changes in diet. No dysphagia to solids or liquids. No significant heartburn or reflux. No hematochezia, hematemesis, coffee ground emesis.  No evidence of prior gastric/peptic ulceration.  (Review of systems as stated in this history (HPI) or in the review of systems. Otherwise all other 12 point ROS are negative) ` ` `   Past Surgical History Gail Fields; 03/18/2018 2:43 PM) No pertinent past surgical history   Diagnostic Studies History Gail Fields; 03/18/2018 2:43 PM) Colonoscopy  never Mammogram  never Pap Smear  1-5 years ago  Allergies Gail Fields; 03/18/2018 2:44 PM) Allergies Reconciled   Medication History Gail Fields; 03/18/2018 2:45 PM) Nitroglycerin (0.4MG Olive Bass Solution, Translingual) Active. Prenatal (27-1MG  Tablet, Oral) Active. Medications Reconciled  Pregnancy / Birth History Gail Fields; 03/18/2018 2:43 PM) Age at menarche  12 years. Contraceptive History  Depo-provera. Gravida  3 Maternal age  34-30 Para  2  Other Problems Gail Fields; 03/18/2018 2:43 PM) Hemorrhoids     Review of Systems Gail Fields; 03/18/2018 2:43 PM) General Not Present- Appetite Loss, Chills, Fatigue, Fever, Night Sweats, Weight Gain and Weight Loss. Skin Not Present- Change in Wart/Mole, Dryness, Hives, Jaundice, New Lesions, Non-Healing Wounds, Rash and Ulcer. HEENT Present- Seasonal Allergies and Wears glasses/contact lenses. Not Present- Earache, Hearing Loss, Hoarseness, Nose Bleed, Oral Ulcers, Ringing in the Ears, Sinus Pain, Sore Throat, Visual Disturbances and Yellow Eyes. Respiratory Not Present- Bloody sputum, Chronic Cough, Difficulty Breathing, Snoring and Wheezing. Breast Not Present- Breast Mass, Breast Pain, Nipple Discharge and Skin Changes. Cardiovascular Not Present- Chest Pain, Difficulty Breathing Lying Down, Leg Cramps, Palpitations, Rapid Heart Rate, Shortness of Breath and Swelling of Extremities. Gastrointestinal Present- Constipation, Hemorrhoids, Indigestion and Rectal Pain. Not Present- Abdominal Pain, Bloating, Bloody Stool, Change in Bowel Habits, Chronic  diarrhea, Difficulty Swallowing, Excessive gas, Gets full quickly at meals, Nausea and Vomiting. Female Genitourinary Not Present- Frequency,  Nocturia, Painful Urination, Pelvic Pain and Urgency. Musculoskeletal Not Present- Back Pain, Joint Pain, Joint Stiffness, Muscle Pain, Muscle Weakness and Swelling of Extremities. Neurological Not Present- Decreased Memory, Fainting, Headaches, Numbness, Seizures, Tingling, Tremor, Trouble walking and Weakness. Psychiatric Not Present- Anxiety, Bipolar, Change in Sleep Pattern, Depression, Fearful and Frequent crying. Endocrine Not Present- Cold Intolerance, Excessive Hunger, Hair Changes, Heat Intolerance, Hot flashes and New Diabetes. Hematology Not Present- Blood Thinners, Easy Bruising, Excessive bleeding, Gland problems, HIV and Persistent Infections.  Vitals Gail Fields; 03/18/2018 2:45 PM) 03/18/2018 2:45 PM Weight: 166.38 lb Height: 66in Body Surface Area: 1.85 m Body Mass Index: 26.85 kg/m  Temp.: 98.61F(Oral)  Pulse: 62 (Regular)  BP: 110/72 (Sitting, Left Arm, Standard)       Physical Exam Gail Sportsman MD; 03/18/2018 3:14 PM) General Mental Status-Alert. General Appearance-Not in acute distress, Not Sickly. Orientation-Oriented X3. Hydration-Well hydrated. Voice-Normal.  Integumentary Global Assessment Upon inspection and palpation of skin surfaces of the - Axillae: non-tender, no inflammation or ulceration, no drainage. and Distribution of scalp and body hair is normal. General Characteristics Temperature - normal warmth is noted.  Head and Neck Head-normocephalic, atraumatic with no lesions or palpable masses. Face Global Assessment - atraumatic, no absence of expression. Neck Global Assessment - no abnormal movements, no bruit auscultated on the right, no bruit auscultated on the left, no decreased range of motion, non-tender. Trachea-midline. Thyroid Gland Characteristics -  non-tender.  Eye Eyeball - Left-Extraocular movements intact, No Nystagmus. Eyeball - Right-Extraocular movements intact, No Nystagmus. Cornea - Left-No Hazy. Cornea - Right-No Hazy. Sclera/Conjunctiva - Left-No scleral icterus, No Discharge. Sclera/Conjunctiva - Right-No scleral icterus, No Discharge. Pupil - Left-Direct reaction to light normal. Pupil - Right-Direct reaction to light normal.  ENMT Ears Pinna - Left - no drainage observed, no generalized tenderness observed. Right - no drainage observed, no generalized tenderness observed. Nose and Sinuses External Inspection of the Nose - no destructive lesion observed. Inspection of the nares - Left - quiet respiration. Right - quiet respiration. Mouth and Throat Lips - Upper Lip - no fissures observed, no pallor noted. Lower Lip - no fissures observed, no pallor noted. Nasopharynx - no discharge present. Oral Cavity/Oropharynx - Tongue - no dryness observed. Oral Mucosa - no cyanosis observed. Hypopharynx - no evidence of airway distress observed.  Chest and Lung Exam Inspection Movements - Normal and Symmetrical. Accessory muscles - No use of accessory muscles in breathing. Palpation Palpation of the chest reveals - Non-tender. Auscultation Breath sounds - Normal and Clear.  Cardiovascular Auscultation Rhythm - Regular. Murmurs & Other Heart Sounds - Auscultation of the heart reveals - No Murmurs and No Systolic Clicks.  Abdomen Inspection Inspection of the abdomen reveals - No Visible peristalsis and No Abnormal pulsations. Umbilicus - No Bleeding, No Urine drainage. Palpation/Percussion Palpation and Percussion of the abdomen reveal - Soft, Non Tender, No Rebound tenderness, No Rigidity (guarding) and No Cutaneous hyperesthesia. Note: Abdomen soft. Not severely distended. No distasis recti. No umbilical or other anterior abdominal wall hernias   Female Genitourinary Sexual Maturity Tanner 5 - Adult  hair pattern. Note: No vaginal bleeding nor discharge   Rectal Note: ` ` ` Left lateral large prolapsed internal hemorrhoid with significant external component. Not thrombosed. No particular inflamed.  Perianal skin clean with good hygiene. No pruritis ani. No pilonidal disease. No fissure. No abscess/fistula. Increased sphincter tone. No other hemorrhoids. No condyloma warts. Barely tolerates the finger. Grade 1/2 internal hemorrhoids on the right side. No obvious thrombus  felt. No mass. She cannot tolerate in anoscope. Exam done with assistance of female FM resident in the room.   Peripheral Vascular Upper Extremity Inspection - Left - No Cyanotic nailbeds, Not Ischemic. Right - No Cyanotic nailbeds, Not Ischemic.  Neurologic Neurologic evaluation reveals -normal attention span and ability to concentrate, able to name objects and repeat phrases. Appropriate fund of knowledge , normal sensation and normal coordination. Mental Status Affect - not angry, not paranoid. Cranial Nerves-Normal Bilaterally. Gait-Normal.  Neuropsychiatric Mental status exam performed with findings of-able to articulate well with normal speech/language, rate, volume and coherence, thought content normal with ability to perform basic computations and apply abstract reasoning and no evidence of hallucinations, delusions, obsessions or homicidal/suicidal ideation.  Musculoskeletal Global Assessment Spine, Ribs and Pelvis - no instability, subluxation or laxity. Right Upper Extremity - no instability, subluxation or laxity.  Lymphatic Head & Neck  General Head & Neck Lymphatics: Bilateral - Description - No Localized lymphadenopathy. Axillary  General Axillary Region: Bilateral - Description - No Localized lymphadenopathy. Femoral & Inguinal  Generalized Femoral & Inguinal Lymphatics: Left - Description - No Localized lymphadenopathy. Right - Description - No Localized  lymphadenopathy.    Assessment & Plan Gail Sportsman MD; 03/18/2018 3:15 PM) PROLAPSED INTERNAL HEMORRHOIDS, GRADE 4 (K64.3) ENCOUNTER FOR PREOPERATIVE EXAMINATION FOR GENERAL SURGICAL PROCEDURE (Z01.818)  Classic left lateral internal hemorrhoid Gr4 prolapsed.  Prolapsed & too sensitive to handle in office  Current Plans You are being scheduled for surgery- Our schedulers will call you.  The anatomy & physiology of the anorectal region was discussed.  The pathophysiology of hemorrhoids and differential diagnosis was discussed.  Natural history progression  was discussed.   I stressed the importance of a bowel regimen to have daily soft bowel movements to minimize progression of disease.   Goal of one BM / day ideal.  Use of wet wipes, warm baths, avoiding straining, etc were emphasized.  Educational handouts further explaining the pathology, treatment options, and bowel regimen were given as well.   The patient expressed understanding.     You should hear from our office's scheduling department within 5 working days about the location, date, and time of surgery. We try to make accommodations for patient's preferences in scheduling surgery, but sometimes the OR schedule or the surgeon's schedule prevents [place] from making those accommodations.  If you have not heard from our office [phone]) in 5 working days, call the office and ask for your surgeon's nurse.  If you have other questions about your diagnosis, plan, or surgery, call the office and ask for your surgeon's nurse.  The anatomy and the physiology was discussed. The pathophysiology and natural history of the disease was discussed. Options were discussed and recommendations were made. Technique, risks, benefits, & alternatives were discussed. Risks such as stroke, heart attack, bleeding, indection, death, and other risks discussed. Questions answered. The patient agrees to proceed. Pt Education - [facility] Rectal Prep for  Anorectal outpatient/office surgery: discussed with patient and provided information. Pt Education - [facility] Rectal Surgery HCI (Ellah Otte): discussed with patient and provided information. CHRONIC CONSTIPATION (K59.09) Current Plans Pt Education - [facility] Constipation (AT) Pt Education - [facility] Good Bowel Health (Cian Costanzo)   Signed by Gail Sportsman, MD (03/18/2018 3:28 PM)

## 2018-03-18 NOTE — H&P (Signed)
Gail Fields Documented: 03/18/2018 2:42 PM Location: [facility] Patient #: [id number] DOB: 07/22/1986 Single / Language: [place] / Race: Black or African American Female  History of Present Illness Gail Sportsman MD; 03/18/2018 3:26 PM) The patient is a 32 year old female who presents with hemorrhoids. Note for "Hemorrhoids": ` ` ` Patient sent for surgical consultation at the request of Dr. Duane Fields  Chief Complaint: Worsening hemorrhoids  The patient is a young woman status post vaginal delivery November 14, 2017. She had some intermittent hemorrhoid problems. They've been a persistent problem postpartum. Discuss with her OB/GYN. External hemorrhoids noted. Sent for consultation with gastroenterology apparently for banding. Redirected to surgery. BM 2x a week on Colace. Was on Miralax in past. Feels "bubbles around my butthole." PAin w defectaion & prolonged sitting. No help with Anusol cream. No help with nitroglycerin cream. She moves her bowels twice a week at best. Claims MiraLAX didn't work for her well but only took 1 or 2 doses a day. No family history of bowel problems. She does not smoke. She can walk several miles without difficulty. No history of diabetes or other peripartum issues. She's never had any anorectal interventions. This hemorrhoid problem started with her first pregnancy of her son in 2016. Things seemed to improve a little bit but then flare back up with this second pregnancy of her daughter that she delivered earlier this year.  No personal nor family history of GI/colon cancer, inflammatory bowel disease, irritable bowel syndrome, allergy such as Celiac Sprue, dietary/dairy problems, colitis, ulcers nor gastritis. No recent sick contacts/gastroenteritis. No travel outside the country. No changes in diet. No dysphagia to solids or liquids. No significant heartburn or reflux. No hematochezia, hematemesis, coffee ground emesis. No  evidence of prior gastric/peptic ulceration.  (Review of systems as stated in this history (HPI) or in the review of systems. Otherwise all other 12 point ROS are negative) ` ` `   Past Surgical History Gail Fields; 03/18/2018 2:43 PM) No pertinent past surgical history  Diagnostic Studies History Gail Fields; 03/18/2018 2:43 PM) Colonoscopy never Mammogram never Pap Smear 1-5 years ago  Allergies Gail Fields; 03/18/2018 2:44 PM) Allergies Reconciled  Medication History Gail Fields; 03/18/2018 2:45 PM) Nitroglycerin (0.4MG Olive Bass Solution, Translingual) Active. Prenatal (27-1MG  Tablet, Oral) Active. Medications Reconciled  Pregnancy / Birth History Gail Fields; 03/18/2018 2:43 PM) Age at menarche 12 years. Contraceptive History Depo-provera. Gravida 3 Maternal age 68-30 Para 2  Other Problems Gail Fields; 03/18/2018 2:43 PM) Hemorrhoids     Review of Systems Gail Fields; 03/18/2018 2:43 PM) General Not Present- Appetite Loss, Chills, Fatigue, Fever, Night Sweats, Weight Gain and Weight Loss. Skin Not Present- Change in Wart/Mole, Dryness, Hives, Jaundice, New Lesions, Non-Healing Wounds, Rash and Ulcer. HEENT Present- Seasonal Allergies and Wears glasses/contact lenses. Not Present- Earache, Hearing Loss, Hoarseness, Nose Bleed, Oral Ulcers, Ringing in the Ears, Sinus Pain, Sore Throat, Visual Disturbances and Yellow Eyes. Respiratory Not Present- Bloody sputum, Chronic Cough, Difficulty Breathing, Snoring and Wheezing. Breast Not Present- Breast Mass, Breast Pain, Nipple Discharge and Skin Changes. Cardiovascular Not Present- Chest Pain, Difficulty Breathing Lying Down, Leg Cramps, Palpitations, Rapid Heart Rate, Shortness of Breath and Swelling of Extremities. Gastrointestinal Present- Constipation, Hemorrhoids, Indigestion and Rectal Pain. Not Present- Abdominal Pain, Bloating, Bloody Stool, Change in Bowel Habits, Chronic diarrhea,  Difficulty Swallowing, Excessive gas, Gets full quickly at meals, Nausea and Vomiting. Female Genitourinary Not Present- Frequency, Nocturia, Painful Urination, Pelvic Pain and Urgency. Musculoskeletal Not Present- Back  Pain, Joint Pain, Joint Stiffness, Muscle Pain, Muscle Weakness and Swelling of Extremities. Neurological Not Present- Decreased Memory, Fainting, Headaches, Numbness, Seizures, Tingling, Tremor, Trouble walking and Weakness. Psychiatric Not Present- Anxiety, Bipolar, Change in Sleep Pattern, Depression, Fearful and Frequent crying. Endocrine Not Present- Cold Intolerance, Excessive Hunger, Hair Changes, Heat Intolerance, Hot flashes and New Diabetes. Hematology Not Present- Blood Thinners, Easy Bruising, Excessive bleeding, Gland problems, HIV and Persistent Infections.  Vitals Gail Fields; 03/18/2018 2:45 PM) 03/18/2018 2:45 PM Weight: 166.38 lb Height: 66in Body Surface Area: 1.85 m Body Mass Index: 26.85 kg/m  Temp.: 98.25F(Oral)  Pulse: 62 (Regular)  BP: 110/72 (Sitting, Left Arm, Standard)      Physical Exam Gail Sportsman MD; 03/18/2018 3:14 PM)  General Mental Status-Alert. General Appearance-Not in acute distress, Not Sickly. Orientation-Oriented X3. Hydration-Well hydrated. Voice-Normal.  Integumentary Global Assessment Upon inspection and palpation of skin surfaces of the - Axillae: non-tender, no inflammation or ulceration, no drainage. and Distribution of scalp and body hair is normal. General Characteristics Temperature - normal warmth is noted.  Head and Neck Head-normocephalic, atraumatic with no lesions or palpable masses. Face Global Assessment - atraumatic, no absence of expression. Neck Global Assessment - no abnormal movements, no bruit auscultated on the right, no bruit auscultated on the left, no decreased range of motion, non-tender. Trachea-midline. Thyroid Gland Characteristics -  non-tender.  Eye Eyeball - Left-Extraocular movements intact, No Nystagmus. Eyeball - Right-Extraocular movements intact, No Nystagmus. Cornea - Left-No Hazy. Cornea - Right-No Hazy. Sclera/Conjunctiva - Left-No scleral icterus, No Discharge. Sclera/Conjunctiva - Right-No scleral icterus, No Discharge. Pupil - Left-Direct reaction to light normal. Pupil - Right-Direct reaction to light normal.  ENMT Ears Pinna - Left - no drainage observed, no generalized tenderness observed. Right - no drainage observed, no generalized tenderness observed. Nose and Sinuses External Inspection of the Nose - no destructive lesion observed. Inspection of the nares - Left - quiet respiration. Right - quiet respiration. Mouth and Throat Lips - Upper Lip - no fissures observed, no pallor noted. Lower Lip - no fissures observed, no pallor noted. Nasopharynx - no discharge present. Oral Cavity/Oropharynx - Tongue - no dryness observed. Oral Mucosa - no cyanosis observed. Hypopharynx - no evidence of airway distress observed.  Chest and Lung Exam Inspection Movements - Normal and Symmetrical. Accessory muscles - No use of accessory muscles in breathing. Palpation Palpation of the chest reveals - Non-tender. Auscultation Breath sounds - Normal and Clear.  Cardiovascular Auscultation Rhythm - Regular. Murmurs & Other Heart Sounds - Auscultation of the heart reveals - No Murmurs and No Systolic Clicks.  Abdomen Inspection Inspection of the abdomen reveals - No Visible peristalsis and No Abnormal pulsations. Umbilicus - No Bleeding, No Urine drainage. Palpation/Percussion Palpation and Percussion of the abdomen reveal - Soft, Non Tender, No Rebound tenderness, No Rigidity (guarding) and No Cutaneous hyperesthesia. Note: Abdomen soft. Not severely distended. No distasis recti. No umbilical or other anterior abdominal wall hernias  Female Genitourinary Sexual Maturity Tanner 5 - Adult  hair pattern. Note: No vaginal bleeding nor discharge  Rectal Note: ` ` ` Left lateral large prolapsed internal hemorrhoid with significant external component. Not thrombosed. No particular inflamed.  Perianal skin clean with good hygiene. No pruritis ani. No pilonidal disease. No fissure. No abscess/fistula. Increased sphincter tone. No other hemorrhoids. No condyloma warts. Barely tolerates the finger. Grade 1/2 internal hemorrhoids on the right side. No obvious thrombus felt. No mass. She cannot tolerate in anoscope. Exam done with assistance of  female FM resident in the room.  Peripheral Vascular Upper Extremity Inspection - Left - No Cyanotic nailbeds, Not Ischemic. Right - No Cyanotic nailbeds, Not Ischemic.  Neurologic Neurologic evaluation reveals -normal attention span and ability to concentrate, able to name objects and repeat phrases. Appropriate fund of knowledge , normal sensation and normal coordination. Mental Status Affect - not angry, not paranoid. Cranial Nerves-Normal Bilaterally. Gait-Normal.  Neuropsychiatric Mental status exam performed with findings of-able to articulate well with normal speech/language, rate, volume and coherence, thought content normal with ability to perform basic computations and apply abstract reasoning and no evidence of hallucinations, delusions, obsessions or homicidal/suicidal ideation.  Musculoskeletal Global Assessment Spine, Ribs and Pelvis - no instability, subluxation or laxity. Right Upper Extremity - no instability, subluxation or laxity.  Lymphatic Head & Neck  General Head & Neck Lymphatics: Bilateral - Description - No Localized lymphadenopathy. Axillary  General Axillary Region: Bilateral - Description - No Localized lymphadenopathy. Femoral & Inguinal  Generalized Femoral & Inguinal Lymphatics: Left - Description - No Localized lymphadenopathy. Right - Description - No Localized  lymphadenopathy.    Assessment & Plan Gail Sportsman MD; 03/18/2018 3:15 PM)  PROLAPSED INTERNAL HEMORRHOIDS, GRADE 4 (K64.3)   ENCOUNTER FOR PREOPERATIVE EXAMINATION FOR GENERAL SURGICAL PROCEDURE (Z01.818)  Current Plans You are being scheduled for surgery- Our schedulers will call you.  You should hear from our office's scheduling department within 5 working days about the location, date, and time of surgery. We try to make accommodations for patient's preferences in scheduling surgery, but sometimes the OR schedule or the surgeon's schedule prevents [place] from making those accommodations.  If you have not heard from our office [phone]) in 5 working days, call the office and ask for your surgeon's nurse.  If you have other questions about your diagnosis, plan, or surgery, call the office and ask for your surgeon's nurse.  The anatomy and the physiology was discussed. The pathophysiology and natural history of the disease was discussed. Options were discussed and recommendations were made. Technique, risks, benefits, & alternatives were discussed. Risks such as stroke, heart attack, bleeding, indection, death, and other risks discussed. Questions answered. The patient agrees to proceed. Pt Education - [facility] Rectal Prep for Anorectal outpatient/office surgery: discussed with patient and provided information. Pt Education - [facility] Rectal Surgery HCI (Sadi Arave): discussed with patient and provided information.  CHRONIC CONSTIPATION (K59.09)  Current Plans Pt Education - [facility] Constipation (AT) Pt Education - [facility] Good Bowel Health (Rosalia Mcavoy)  Gail Fields, M.D., F.A.C.S. Gastrointestinal and Minimally Invasive Surgery [facility] [address], [address] [place], [place] [address] [phone] Main / Paging

## 2018-03-25 ENCOUNTER — Telehealth: Payer: Self-pay

## 2018-03-25 ENCOUNTER — Other Ambulatory Visit: Payer: Self-pay | Admitting: Obstetrics & Gynecology

## 2018-03-25 ENCOUNTER — Telehealth: Payer: Self-pay | Admitting: Obstetrics & Gynecology

## 2018-03-25 NOTE — Telephone Encounter (Signed)
Pt called to cancel her appointment for 03/27/18. Pt will call back to r/s.

## 2018-03-25 NOTE — Telephone Encounter (Signed)
Verbal order called in for PNV.

## 2018-03-27 ENCOUNTER — Ambulatory Visit: Payer: BC Managed Care – PPO | Admitting: Nurse Practitioner

## 2018-03-27 ENCOUNTER — Encounter

## 2018-04-22 NOTE — Patient Instructions (Addendum)
Gail KocherLeslie Anne Fields  04/22/2018   Your procedure is scheduled on: 04-24-18   [facility]    Report to admitting at 1:15 PM    Call this number if you have problems the morning of surgery [phone]   Remember: Do not eat food or drink liquids :After Midnight. You may have a Clear Liquid Diet from Midnight until 8:15 AM. After 8:15 AM, nothing until after surgery.     CLEAR LIQUID DIET   Foods Allowed                                                                     Foods Excluded  Coffee and tea, regular and decaf                             liquids that you cannot  Plain Jell-O in any flavor                                             see through such as: Fruit ices (not with fruit pulp)                                     milk, soups, orange juice  Iced Popsicles                                    All solid food Carbonated beverages, regular and diet                                    Cranberry, grape and apple juices Sports drinks like Gatorade Lightly seasoned clear broth or consume(fat free) Sugar, honey syrup  Sample Menu Breakfast                                Lunch                                     Supper Cranberry juice                    Beef broth                            Chicken broth Jell-O                                     Grape juice  Apple juice Coffee or tea                        Jell-O                                      Popsicle                                                Coffee or tea                        Coffee or tea  _____________________________________________________________________     Take these medicines the morning of surgery with A SIP OF WATER: None                                You may not have any metal on your body including hair pins and              piercings  Do not wear jewelry, make-up, lotions, powders or perfumes, deodorant             Do  not wear nail polish.  Do not shave  48 hours prior to surgery.              Do not bring valuables to the hospital. [facility] IS NOT             RESPONSIBLE   FOR VALUABLES.  Contacts, dentures or bridgework may not be worn into surgery.       Patients discharged the day of surgery will not be allowed to drive home.  Name and phone number of your driver: Landis Gandy [phone]  Special Instructions: Please follow your prep,  per your surgeon's instructions               Please read over the following fact sheets you were given: _____________________________________________________________________             [facility] - Preparing for Surgery Before surgery, you can play an important role.  Because skin is not sterile, your skin needs to be as free of germs as possible.  You can reduce the number of germs on your skin by washing with CHG (chlorahexidine gluconate) soap before surgery.  CHG is an antiseptic cleaner which kills germs and bonds with the skin to continue killing germs even after washing. Please DO NOT use if you have an allergy to CHG or antibacterial soaps.  If your skin becomes reddened/irritated stop using the CHG and inform your nurse when you arrive at Short Stay. Do not shave (including legs and underarms) for at least 48 hours prior to the first CHG shower.  You may shave your face/neck. Please follow these instructions carefully:  1.  Shower with CHG Soap the night before surgery and the  morning of Surgery.  2.  If you choose to wash your hair, wash your hair first as usual with your  normal  shampoo.  3.  After you shampoo, rinse your hair and body thoroughly to remove the  shampoo.  4.  Use CHG as you would any other liquid soap.  You can apply chg directly  to the skin and wash                       Gently with a scrungie or clean washcloth.  5.  Apply the CHG Soap to your body ONLY FROM THE NECK DOWN.   Do not use on face/ open                            Wound or open sores. Avoid contact with eyes, ears mouth and genitals (private parts).                       Wash face,  Genitals (private parts) with your normal soap.             6.  Wash thoroughly, paying special attention to the area where your surgery  will be performed.  7.  Thoroughly rinse your body with warm water from the neck down.  8.  DO NOT shower/wash with your normal soap after using and rinsing off  the CHG Soap.                9.  Pat yourself dry with a clean towel.            10.  Wear clean pajamas.            11.  Place clean sheets on your bed the night of your first shower and do not  sleep with pets. Day of Surgery : Do not apply any lotions/deodorants the morning of surgery.  Please wear clean clothes to the hospital/surgery center.  FAILURE TO FOLLOW THESE INSTRUCTIONS MAY RESULT IN THE CANCELLATION OF YOUR SURGERY PATIENT SIGNATURE_________________________________  NURSE SIGNATURE__________________________________  ________________________________________________________________________

## 2018-04-23 ENCOUNTER — Other Ambulatory Visit: Payer: Self-pay

## 2018-04-23 ENCOUNTER — Other Ambulatory Visit (HOSPITAL_COMMUNITY): Payer: BC Managed Care – PPO

## 2018-04-23 ENCOUNTER — Encounter (HOSPITAL_COMMUNITY)
Admission: RE | Admit: 2018-04-23 | Discharge: 2018-04-23 | Disposition: A | Discharge status: Home / Self Care | Payer: BC Managed Care – PPO | Source: Ambulatory Visit | Attending: Surgery | Admitting: Surgery

## 2018-04-23 ENCOUNTER — Encounter (HOSPITAL_COMMUNITY): Payer: Self-pay

## 2018-04-23 DIAGNOSIS — Z01812 Encounter for preprocedural laboratory examination: Secondary | ICD-10-CM | POA: Insufficient documentation

## 2018-04-23 DIAGNOSIS — K643 Fourth degree hemorrhoids: Secondary | ICD-10-CM | POA: Insufficient documentation

## 2018-04-23 LAB — BASIC METABOLIC PANEL
Anion gap: 7 (ref 5–15)
BUN: 11 mg/dL (ref 6–20)
CALCIUM: 9.6 mg/dL (ref 8.9–10.3)
CHLORIDE: 106 mmol/L (ref 101–111)
CO2: 29 mmol/L (ref 22–32)
CREATININE: 0.89 mg/dL (ref 0.44–1.00)
GFR calc Af Amer: >60 mL/min (ref >60)
GFR calc non Af Amer: >60 mL/min (ref >60)
GLUCOSE: 93 mg/dL (ref 65–99)
Potassium: 4.5 mmol/L (ref 3.5–5.1)
Sodium: 142 mmol/L (ref 135–145)

## 2018-04-23 LAB — HCG, SERUM, QUALITATIVE: PREG SERUM: NEGATIVE

## 2018-04-23 LAB — CBC
HCT: 37.8 % (ref 36.0–46.0)
Hemoglobin: 12.7 g/dL (ref 12.0–15.0)
MCH: 30.2 pg (ref 26.0–34.0)
MCHC: 33.6 g/dL (ref 30.0–36.0)
MCV: 89.8 fL (ref 78.0–100.0)
PLATELETS: 263 10*3/uL (ref 150–400)
RBC: 4.21 MIL/uL (ref 3.87–5.11)
RDW: 12.3 % (ref 11.5–15.5)
WBC: 8.1 10*3/uL (ref 4.0–10.5)

## 2018-04-23 MED ORDER — BUPIVACAINE LIPOSOME 1.3 % IJ SUSP
20.0000 mL | INTRAMUSCULAR | Status: DC
  Filled 2018-04-23: qty 20

## 2018-04-24 ENCOUNTER — Ambulatory Visit (HOSPITAL_COMMUNITY): Payer: BC Managed Care – PPO | Admitting: Certified Registered Nurse Anesthetist

## 2018-04-24 ENCOUNTER — Encounter (HOSPITAL_COMMUNITY)
Admission: RE | Disposition: A | Discharge status: Home / Self Care | Payer: Self-pay | Source: Ambulatory Visit | Attending: Surgery

## 2018-04-24 ENCOUNTER — Telehealth (HOSPITAL_COMMUNITY): Payer: Self-pay | Admitting: *Deleted

## 2018-04-24 ENCOUNTER — Ambulatory Visit (HOSPITAL_COMMUNITY)
Admission: RE | Admit: 2018-04-24 | Discharge: 2018-04-24 | Disposition: A | Discharge status: Home / Self Care | Payer: BC Managed Care – PPO | Source: Ambulatory Visit | Attending: Surgery | Admitting: Surgery

## 2018-04-24 ENCOUNTER — Encounter (HOSPITAL_COMMUNITY): Payer: Self-pay | Admitting: *Deleted

## 2018-04-24 DIAGNOSIS — K643 Fourth degree hemorrhoids: Secondary | ICD-10-CM | POA: Diagnosis not present

## 2018-04-24 DIAGNOSIS — K642 Third degree hemorrhoids: Secondary | ICD-10-CM

## 2018-04-24 HISTORY — PX: HEMORRHOID SURGERY: SHX153

## 2018-04-24 HISTORY — PX: RECTAL EXAM UNDER ANESTHESIA: SHX6399

## 2018-04-24 HISTORY — PX: PEXY: SHX6024

## 2018-04-24 SURGERY — HEMORRHOIDECTOMY
Anesthesia: General

## 2018-04-24 MED ORDER — BUPIVACAINE-EPINEPHRINE (PF) 0.25% -1:200000 IJ SOLN
INTRAMUSCULAR | Status: AC
  Filled 2018-04-24: qty 30

## 2018-04-24 MED ORDER — CELECOXIB 200 MG PO CAPS
200.0000 mg | ORAL_CAPSULE | ORAL | Status: AC
  Administered 2018-04-24: 200 mg via ORAL
  Filled 2018-04-24: qty 1

## 2018-04-24 MED ORDER — ONDANSETRON HCL 4 MG/2ML IJ SOLN
INTRAMUSCULAR | Status: DC | PRN
  Administered 2018-04-24: 4 mg via INTRAVENOUS

## 2018-04-24 MED ORDER — CHLORHEXIDINE GLUCONATE CLOTH 2 % EX PADS: 6.0000 | MEDICATED_PAD | Freq: Once | CUTANEOUS | Status: DC

## 2018-04-24 MED ORDER — LACTATED RINGERS IV SOLN: INTRAVENOUS | Status: DC

## 2018-04-24 MED ORDER — DIBUCAINE 1 % RE OINT
TOPICAL_OINTMENT | RECTAL | Status: DC | PRN
  Administered 2018-04-24: 1 via RECTAL

## 2018-04-24 MED ORDER — ROCURONIUM BROMIDE 50 MG/5ML IV SOSY
PREFILLED_SYRINGE | INTRAVENOUS | Status: DC | PRN
  Administered 2018-04-24: 45 mg via INTRAVENOUS

## 2018-04-24 MED ORDER — MIDAZOLAM HCL 2 MG/2ML IJ SOLN
INTRAMUSCULAR | Status: AC
  Filled 2018-04-24: qty 2

## 2018-04-24 MED ORDER — FENTANYL CITRATE (PF) 100 MCG/2ML IJ SOLN: 25.0000 ug | INTRAMUSCULAR | Status: DC | PRN

## 2018-04-24 MED ORDER — LIDOCAINE 2% (20 MG/ML) 5 ML SYRINGE
INTRAMUSCULAR | Status: DC | PRN
Start: 2018-04-24 — End: 2018-04-24
  Administered 2018-04-24: 75 mg via INTRAVENOUS

## 2018-04-24 MED ORDER — BUPIVACAINE LIPOSOME 1.3 % IJ SUSP
INTRAMUSCULAR | Status: DC | PRN
  Administered 2018-04-24: 20 mL

## 2018-04-24 MED ORDER — PROMETHAZINE HCL 25 MG/ML IJ SOLN
6.2500 mg | INTRAMUSCULAR | Status: DC | PRN
  Administered 2018-04-24: 6.25 mg via INTRAVENOUS

## 2018-04-24 MED ORDER — PROPOFOL 10 MG/ML IV BOLUS
INTRAVENOUS | Status: AC
  Filled 2018-04-24: qty 20

## 2018-04-24 MED ORDER — OXYCODONE HCL 5 MG PO TABS: 5.0000 mg | ORAL_TABLET | Freq: Four times a day (QID) | ORAL | 0 refills | Status: DC | PRN

## 2018-04-24 MED ORDER — OXYCODONE HCL 5 MG PO TABS: 5.0000 mg | ORAL_TABLET | Freq: Once | ORAL | Status: DC | PRN

## 2018-04-24 MED ORDER — ONDANSETRON HCL 4 MG/2ML IJ SOLN
INTRAMUSCULAR | Status: AC
  Filled 2018-04-24: qty 2

## 2018-04-24 MED ORDER — ACETAMINOPHEN 500 MG PO TABS
1000.0000 mg | ORAL_TABLET | ORAL | Status: AC
  Administered 2018-04-24: 1000 mg via ORAL
  Filled 2018-04-24: qty 2

## 2018-04-24 MED ORDER — LACTATED RINGERS IV SOLN
INTRAVENOUS | Status: DC
  Administered 2018-04-24 (×2): via INTRAVENOUS

## 2018-04-24 MED ORDER — LIDOCAINE 2% (20 MG/ML) 5 ML SYRINGE
INTRAMUSCULAR | Status: DC | PRN
  Administered 2018-04-24: 1.5 mg/kg/h via INTRAVENOUS

## 2018-04-24 MED ORDER — DIBUCAINE 1 % RE OINT
TOPICAL_OINTMENT | RECTAL | Status: AC
  Filled 2018-04-24: qty 28

## 2018-04-24 MED ORDER — GABAPENTIN 300 MG PO CAPS
300.0000 mg | ORAL_CAPSULE | ORAL | Status: AC
  Administered 2018-04-24: 300 mg via ORAL
  Filled 2018-04-24: qty 1

## 2018-04-24 MED ORDER — SUGAMMADEX SODIUM 200 MG/2ML IV SOLN
INTRAVENOUS | Status: DC | PRN
  Administered 2018-04-24: 150 mg via INTRAVENOUS
  Administered 2018-04-24: 50 mg via INTRAVENOUS

## 2018-04-24 MED ORDER — PROPOFOL 10 MG/ML IV BOLUS
INTRAVENOUS | Status: DC | PRN
Start: 2018-04-24 — End: 2018-04-24
  Administered 2018-04-24: 180 mg via INTRAVENOUS

## 2018-04-24 MED ORDER — AMBULATORY NON FORMULARY MEDICATION: 1.0000 "application " | Freq: Four times a day (QID) | 2 refills | Status: DC

## 2018-04-24 MED ORDER — LIDOCAINE 2% (20 MG/ML) 5 ML SYRINGE
INTRAMUSCULAR | Status: AC
  Filled 2018-04-24: qty 5

## 2018-04-24 MED ORDER — PROMETHAZINE HCL 25 MG/ML IJ SOLN
INTRAMUSCULAR | Status: AC
  Filled 2018-04-24: qty 1

## 2018-04-24 MED ORDER — LABETALOL HCL 5 MG/ML IV SOLN
INTRAVENOUS | Status: DC | PRN
  Administered 2018-04-24: 5 mg via INTRAVENOUS

## 2018-04-24 MED ORDER — FENTANYL CITRATE (PF) 100 MCG/2ML IJ SOLN
INTRAMUSCULAR | Status: AC
  Filled 2018-04-24: qty 2

## 2018-04-24 MED ORDER — BUPIVACAINE-EPINEPHRINE (PF) 0.25% -1:200000 IJ SOLN
INTRAMUSCULAR | Status: DC | PRN
  Administered 2018-04-24: 20 mL

## 2018-04-24 MED ORDER — DEXAMETHASONE SODIUM PHOSPHATE 10 MG/ML IJ SOLN
INTRAMUSCULAR | Status: AC
  Filled 2018-04-24: qty 1

## 2018-04-24 MED ORDER — OXYCODONE HCL 5 MG/5ML PO SOLN
5.0000 mg | Freq: Once | ORAL | Status: DC | PRN
  Filled 2018-04-24: qty 5

## 2018-04-24 MED ORDER — KETOROLAC TROMETHAMINE 30 MG/ML IJ SOLN: 30.0000 mg | Freq: Once | INTRAMUSCULAR | Status: DC | PRN

## 2018-04-24 MED ORDER — METRONIDAZOLE IN NACL 5-0.79 MG/ML-% IV SOLN
500.0000 mg | INTRAVENOUS | Status: AC
  Administered 2018-04-24: 500 mg via INTRAVENOUS
  Filled 2018-04-24: qty 100

## 2018-04-24 MED ORDER — FENTANYL CITRATE (PF) 100 MCG/2ML IJ SOLN
INTRAMUSCULAR | Status: DC | PRN
  Administered 2018-04-24: 50 ug via INTRAVENOUS
  Administered 2018-04-24 (×2): 100 ug via INTRAVENOUS
  Administered 2018-04-24: 50 ug via INTRAVENOUS

## 2018-04-24 MED ORDER — MIDAZOLAM HCL 2 MG/2ML IJ SOLN
INTRAMUSCULAR | Status: DC | PRN
  Administered 2018-04-24: 2 mg via INTRAVENOUS

## 2018-04-24 MED ORDER — CEFAZOLIN SODIUM-DEXTROSE 2-4 GM/100ML-% IV SOLN
2.0000 g | INTRAVENOUS | Status: AC
  Administered 2018-04-24: 2 g via INTRAVENOUS
  Filled 2018-04-24: qty 100

## 2018-04-24 MED ORDER — ROCURONIUM BROMIDE 100 MG/10ML IV SOLN
INTRAVENOUS | Status: AC
  Filled 2018-04-24: qty 1

## 2018-04-24 MED ORDER — DEXAMETHASONE SODIUM PHOSPHATE 10 MG/ML IJ SOLN
INTRAMUSCULAR | Status: DC | PRN
  Administered 2018-04-24: 10 mg via INTRAVENOUS

## 2018-04-24 MED ORDER — FENTANYL CITRATE (PF) 250 MCG/5ML IJ SOLN
INTRAMUSCULAR | Status: AC
  Filled 2018-04-24: qty 5

## 2018-04-24 SURGICAL SUPPLY — 33 items
BENZOIN TINCTURE PRP APPL 2/3 (GAUZE/BANDAGES/DRESSINGS) ×3 IMPLANT
BLADE SURG 15 STRL LF DISP TIS (BLADE) IMPLANT
BLADE SURG 15 STRL SS (BLADE)
BNDG GAUZE ELAST 4 BULKY (GAUZE/BANDAGES/DRESSINGS) ×3 IMPLANT
BRIEF STRETCH FOR OB PAD LRG (UNDERPADS AND DIAPERS) ×3 IMPLANT
CONT SPEC 4OZ CLIKSEAL STRL BL (MISCELLANEOUS) ×3 IMPLANT
COVER SURGICAL LIGHT HANDLE (MISCELLANEOUS) ×3 IMPLANT
DECANTER SPIKE VIAL GLASS SM (MISCELLANEOUS) ×3 IMPLANT
DRAPE LAPAROTOMY T 102X78X121 (DRAPES) ×3 IMPLANT
DRSG PAD ABDOMINAL 8X10 ST (GAUZE/BANDAGES/DRESSINGS) IMPLANT
ELECT PENCIL ROCKER SW 15FT (MISCELLANEOUS) ×3 IMPLANT
ELECT REM PT RETURN 15FT ADLT (MISCELLANEOUS) ×3 IMPLANT
GAUZE 4X4 16PLY RFD (DISPOSABLE) ×3 IMPLANT
GAUZE SPONGE 4X4 12PLY STRL (GAUZE/BANDAGES/DRESSINGS) IMPLANT
GLOVE ECLIPSE 8.0 STRL XLNG CF (GLOVE) ×3 IMPLANT
GLOVE INDICATOR 8.0 STRL GRN (GLOVE) ×3 IMPLANT
GOWN STRL REUS W/TWL XL LVL3 (GOWN DISPOSABLE) ×6 IMPLANT
KIT BASIN OR (CUSTOM PROCEDURE TRAY) ×3 IMPLANT
LOOP VESSEL MAXI BLUE (MISCELLANEOUS) IMPLANT
LUBRICANT JELLY K Y 4OZ (MISCELLANEOUS) ×3 IMPLANT
NEEDLE HYPO 22GX1.5 SAFETY (NEEDLE) ×3 IMPLANT
PACK BASIC VI WITH GOWN DISP (CUSTOM PROCEDURE TRAY) ×3 IMPLANT
SHEARS HARMONIC 9CM CVD (BLADE) IMPLANT
SUT CHROMIC 2 0 SH (SUTURE) ×6 IMPLANT
SUT CHROMIC 3 0 SH 27 (SUTURE) IMPLANT
SUT VIC AB 2-0 SH 27 (SUTURE)
SUT VIC AB 2-0 SH 27X BRD (SUTURE) IMPLANT
SUT VIC AB 2-0 UR6 27 (SUTURE) ×18 IMPLANT
SYR 20CC LL (SYRINGE) ×3 IMPLANT
SYR 3ML LL SCALE MARK (SYRINGE) IMPLANT
TOWEL OR 17X26 10 PK STRL BLUE (TOWEL DISPOSABLE) ×3 IMPLANT
TOWEL OR NON WOVEN STRL DISP B (DISPOSABLE) ×3 IMPLANT
YANKAUER SUCT BULB TIP 10FT TU (MISCELLANEOUS) ×3 IMPLANT

## 2018-04-24 NOTE — Anesthesia Preprocedure Evaluation (Signed)
Anesthesia Evaluation  Patient identified by MRN, date of birth, ID band Patient awake    Reviewed: Allergy & Precautions, NPO status , Patient's Chart, lab work & pertinent test results  Airway Mallampati: II  TM Distance: >3 FB Neck ROM: Full    Dental no notable dental hx.    Pulmonary neg pulmonary ROS,    Pulmonary exam normal breath sounds clear to auscultation       Cardiovascular negative cardio ROS Normal cardiovascular exam Rhythm:Regular Rate:Normal     Neuro/Psych negative neurological ROS  negative psych ROS   GI/Hepatic negative GI ROS, Neg liver ROS,   Endo/Other  negative endocrine ROS  Renal/GU negative Renal ROS  negative genitourinary   Musculoskeletal negative musculoskeletal ROS (+)   Abdominal   Peds negative pediatric ROS (+)  Hematology negative hematology ROS (+)   Anesthesia Other Findings   Reproductive/Obstetrics negative OB ROS                             Anesthesia Physical Anesthesia Plan  ASA: I  Anesthesia Plan: General   Post-op Pain Management:    Induction: Intravenous  PONV Risk Score and Plan: 3 and Ondansetron, Dexamethasone, Midazolam and Treatment may vary due to age or medical condition  Airway Management Planned: Oral ETT and LMA  Additional Equipment:   Intra-op Plan:   Post-operative Plan: Extubation in OR  Informed Consent: I have reviewed the patients History and Physical, chart, labs and discussed the procedure including the risks, benefits and alternatives for the proposed anesthesia with the patient or authorized representative who has indicated his/her understanding and acceptance.   Dental advisory given  Plan Discussed with: CRNA and Surgeon  Anesthesia Plan Comments:         Anesthesia Quick Evaluation

## 2018-04-24 NOTE — Discharge Instructions (Signed)
ANORECTAL SURGERY:  °POST OPERATIVE INSTRUCTIONS ° °###################################################################### ° °EAT °Start with a pureed / full liquid diet °After 24 hours, gradually transition to a high fiber diet.   ° °CONTROL PAIN °Control pain so you can tolerate bowel movements,  °walk, sleep, tolerate sneezing/coughing, and go up/down stairs. ° ° °HAVE A BOWEL MOVEMENT DAILY °Keep your bowels regular to avoid problems.   °Taking a fiber supplement every day to keep bowels soft.   °Try a laxative to override constipation. °Use an antidairrheal to slow down diarrhea.   °Call if not better after 2 tries ° °WALK °Walk an hour a day.  Control your pain to do that. °  °CALL IF YOU HAVE PROBLEMS/CONCERNS °Call if you are still struggling despite following these instructions. °Call if you have concerns not answered by these instructions ° °###################################################################### ° ° ° °1. Take your usually prescribed home medications unless otherwise directed. °2. DIET: Follow a light bland diet the first 24 hours after arrival home, such as soup, liquids, crackers, etc.  Be sure to include lots of fluids daily.  Avoid fast food or heavy meals as your are more likely to get nauseated.  Eat a low fat the next few days after surgery.   °3. PAIN CONTROL: °a. Pain is best controlled by a usual combination of three different methods TOGETHER: °i. Ice/Heat °ii. Over the counter pain medication °iii. Prescription pain medication °b. Expect swelling and discomfort in the anus/rectal area.  Warm water baths (30-60 minutes up to 6 times a day, especially after bowel meovements) will help. Use ice for the first few days to help decrease swelling and bruising, then switch to heat such as warm towels, sitz baths, warm baths, etc to help relax tight/sore spots and speed recovery.  Some people prefer to use ice alone, heat alone, alternating between ice & heat.  Experiment to what works  for you.   °c. It is helpful to take an over-the-counter pain medication continuously for the first few weeks.  Choose one of the following that works best for you: °i. Naproxen (Aleve, etc)  Two 220mg tabs twice a day °ii. Ibuprofen (Advil, etc) Three 200mg tabs four times a day (every meal & bedtime) °iii. Acetaminophen (Tylenol, etc) 500-650mg four times a day (every meal & bedtime) °d. A  prescription for pain medication (such as oxycodone, hydrocodone, etc) should be given to you upon discharge.  Take your pain medication as prescribed.  °i. If you are having problems/concerns with the prescription medicine (does not control pain, nausea, vomiting, rash, itching, etc), please call us [phone] to see if we need to switch you to a different pain medicine that will work better for you and/or control your side effect better. °ii. If you need a refill on your pain medication, please contact your pharmacy.  They will contact our office to request authorization. Prescriptions will not be filled after 5 pm or on week-ends.  If can take up to 48 hours for it to be filled & ready so avoid waiting until you are down to thel ast pill. °e. A topical cream (Dibucaine) or a prescription for a cream (such as diltiazem 2% gel) may be given to you.  Many people find relief with topical creams.  Some people find it burns too much.  Experiment.  If it helps, use it.  If it burns, don't using it. ° °Use a Sitz Bath 4-8 times a day for relief ° ° °Sitz Bath °A sitz bath   is a warm water bath taken in the sitting position that covers only the hips and buttocks. It may be used for either healing or hygiene purposes. Sitz baths are also used to relieve pain, itching, or muscle spasms. The water may contain medicine. Moist heat will help you heal and relax.  °HOME CARE INSTRUCTIONS  °Take 3 to 4 sitz baths a day. °1. Fill the bathtub half full with warm water. °2. Sit in the water and open the drain a little. °3. Turn on the warm  water to keep the tub half full. Keep the water running constantly. °4. Soak in the water for 15 to 20 minutes. °5. After the sitz bath, pat the affected area dry first. ° ° °4. KEEP YOUR BOWELS REGULAR °a. The goal is one soft bowel movement a day °b. Avoid getting constipated.  Between the surgery and the pain medications, it is common to experience some constipation.  Increasing fluid intake and taking a fiber supplement (such as Metamucil, Citrucel, FiberCon, MiraLax, etc) 2-3 times a day regularly will usually help prevent this problem from occurring.  A mild laxative (prune juice, Milk of Magnesia, MiraLax, etc) should be taken according to package directions if there are no bowel movements after 48 hours. °c. Watch out for diarrhea.  If you have many loose bowel movements, simplify your diet to bland foods & liquids for a few days.  Stop any stool softeners and decrease your fiber supplement.  Switching to mild anti-diarrheal medications (Kayopectate, Pepto Bismol) can help.  Can try an imodium/loperamide dose.  If this worsens or does not improve, please call us. ° °5. Wound Care ° °a. Remove your bandages with your first bowel movement, usually the day after surgery.  You may have packing if you had an abscess.  Let any packing or gauze fall come out.   °b. Wear an absorbent pad or soft cotton balls in your underwear as needed to catch any drainage and help keep the area  °c. Keep the area clean and dry.  Bathe / shower every day.  Keep the area clean by showering / bathing over the incision / wound.   It is okay to soak an open wound to help wash it.  Consider using a squeeze bottle filled with warm water to gently wash the anal area.  Wet wipes or showers / gentle washing after bowel movements is often less traumatic than regular toilet paper. °d. You will often notice bleeding with bowel movements.  This should slow down by the end of the first week of surgery.  Sitting on an ice pack can  help. °e. Expect some drainage.  This should slow down by the end of the first week of surgery, but you will have occasional bleeding or drainage up to a few months after surgery.  Wear an absorbent pad or soft cotton gauze in your underwear until the drainage stops. ° °6. ACTIVITIES as tolerated:   °a. You may resume regular (light) daily activities beginning the next day--such as daily self-care, walking, climbing stairs--gradually increasing activities as tolerated.  If you can walk 30 minutes without difficulty, it is safe to try more intense activity such as jogging, treadmill, bicycling, low-impact aerobics, swimming, etc. °b. Save the most intensive and strenuous activity for last such as sit-ups, heavy lifting, contact sports, etc  Refrain from any heavy lifting or straining until you are off narcotics for pain control.   °c. DO NOT PUSH THROUGH PAIN.  Let pain   be your guide: If it hurts to do something, don't do it.  Pain is your body warning you to avoid that activity for another week until the pain goes down. °d. You may drive when you are no longer taking prescription pain medication, you can comfortably sit for long periods of time, and you can safely maneuver your car and apply brakes. °e. You may have sexual intercourse when it is comfortable.  °7. FOLLOW UP in our office °a. Please call [facility] at [phone] to set up an appointment to see your surgeon in the office for a follow-up appointment approximately 2-3 weeks after your surgery. °b. Make sure that you call for this appointment the day you arrive home to ensure a convenient appointment time. ° °8. IF YOU HAVE DISABILITY OR FAMILY LEAVE FORMS, BRING THEM TO THE OFFICE FOR PROCESSING.  DO NOT GIVE THEM TO YOUR DOCTOR. ° ° ° ° ° ° ° °WHEN TO CALL US [phone]: °1. Poor pain control °2. Reactions / problems with new medications (rash/itching, nausea, etc)  °3. Fever over 101.5 F (38.5 C) °4. Inability to urinate °5. Nausea and/or  vomiting °6. Worsening swelling or bruising °7. Continued bleeding from incision. °8. Increased pain, redness, or drainage from the incision ° °The clinic staff is available to answer your questions during regular business hours (8:30am-5pm).  Please don’t hesitate to call and ask to speak to one of our nurses for clinical concerns.   A surgeon from [facility] is always on call at the hospitals °  °If you have a medical emergency, go to the nearest emergency room or call 911. °  ° °[facility] °[address], [address], [place], [place]  [address] ? °MAIN: [phone] ? TOLL FREE: [phone] ? °FAX [phone] °[email or web address] ° ° °HEMORRHOIDS  °The rectum is the last foot of your colon, and it naturally stretches to hold stool.  Hemorrhoidal piles are natural clusters of blood vessels that help the rectum and anal canal stretch to hold stool and allow bowel movements to eliminate feces.   °Hemorrhoids are abnormally swollen blood vessels in the rectum.  Too much pressure in the rectum causes hemorrhoids by forcing blood to stretch and bulge the walls of the veins, sometimes even rupturing them.  Hemorrhoids can become like varicose veins you might see on a person's legs.  °Most people will develop a flare of hemorrhoids in their lifetime.  When bulging hemorrhoidal veins are irritated, they can swell, burn, itch, cause pain, and bleed.  Most flares will calm down gradually own within a few weeks.  However, once hemorrhoids are created, they are difficult to get rid of completely and tend to flare more easily than the first flare.   Fortunately, good habits and simple medical treatment usually control hemorrhoids well, and surgery is needed only in severe cases. °Types of Hemorrhoids:  °Internal hemorrhoids usually don't initially hurt or itch; they are deep inside the rectum and usually have no sensation. If they begin to push out (prolapse), pain and  burning can occur.  However, internal hemorrhoids can bleed.  Anal bleeding should not be ignored since bleeding could come from a dangerous source like colorectal cancer, so persistent rectal bleeding should be investigated by a doctor, sometimes with a colonoscopy.  °External hemorrhoids cause most of the symptoms - pain, burning, and itching. Nonirritated hemorrhoids can look like small skin tags coming out of the anus.   °Thrombosed hemorrhoids can form   when a hemorrhoid blood vessel bursts and causes the hemorrhoid to suddenly swell.  A purple blood clot can form in it and become an excruciatingly painful lump at the anus. Because of these unpleasant symptoms, immediate incision and drainage by a surgeon at an office visit can provide much relief of the pain.    PREVENTION Avoiding the most frequent causes listed below will prevent most cases of hemorrhoids: Constipation Hard stools Diarrhea  Constant sitting  Straining with bowel movements Sitting on the toilet for a long time  Severe coughing  episodes Pregnancy / Childbirth  Heavy Lifting  Sometimes avoiding the above triggers is difficult:  How can you avoid sitting all day if you have a seated job? Also, we try to avoid coughing and diarrhea, but sometimes its beyond your control.  Still, there are some practical hints to help: Keep the anal and genital area clean.  Moistened tissues such as flushable wet wipes are less irritating than toilet paper.  Using irrigating showers or bottle irrigation washing gently cleans this sensitive area.   Avoid dry toilet paper when cleaning after bowel movements.  [name] Keep the anal and genital area dry.  Lightly pat the rectal area dry.  Avoid rubbing.  Talcum or baby powders can help GET YOUR STOOLS SOFT.   This is the most important way to prevent irritated hemorrhoids.  Hard stools are like sandpaper to the anorectal canal and will cause more problems.  The goal: ONE SOFT BOWEL MOVEMENT A DAY!  BMs from  every other day to 3 times a day is a tolerable range Treat coughing, diarrhea and constipation early since irritated hemorrhoids may soon follow.  If your main job activity is seated, always stand or walk during your breaks. Make it a point to stand and walk at least 5 minutes every hour and try to shift frequently in your chair to avoid direct rectal pressure.  Always exhale as you strain or lift. Don't hold your breath.  Do not delay or try to prevent a bowel movement when the urge is present. Exercise regularly (walking or jogging 60 minutes a day) to stimulate the bowels to move. No reading or other activity while on the toilet. If bowel movements take longer than 5 minutes, you are too constipated. AVOID CONSTIPATION Drink plenty of liquids (1 1/2 to 2 quarts of water and other fluids a day unless fluid restricted for another medical condition). Liquids that contain caffeine (coffee a, tea, soft drinks) can be dehydrating and should be avoided until constipation is controlled. Consider minimizing milk, as dairy products may be constipating. Eat plenty of fiber (30g a day ideal, more if needed).  Fiber is the undigested part of plant food that passes into the colon, acting as natures broom to encourage bowel motility and movement.  Fiber can absorb and hold large amounts of water. This results in a larger, bulkier stool, which is soft and easier to pass.  Eating foods high in fiber - 12 servings - such as  Vegetables: Root (potatoes, carrots, turnips), Leafy green (lettuce, salad greens, celery, spinach), High residue (cabbage, broccoli, etc.) Fruit: Fresh, Dried (prunes, apricots, cherries), Stewed (applesauce)  Whole grain breads, pasta, whole wheat Bran cereals, muffins, etc. Consider adding supplemental bulking fiber which retains large volumes of water: Psyllium ground seeds (native plant from [place])--available as Metamucil, Konsyl, Effersyllium, Per Diem Fiber, or the less  expensive generic forms.  Citrucel  (methylcellulose wood fiber) . FiberCon (Polycarbophil) Polyethylene Glycol - and  artificial” fiber commonly called Miralax or Glycolax.  It is helpful for people with gassy or bloated feelings with regular fiber °Flax Seed - a less gassy natural fiber  °Laxatives can be useful for a short period if constipation is severe °Osmotics (Milk of Magnesia, Fleets Phospho-Soda, Magnesium Citrate)  °Stimulants (Senokot,   Castor Oil,  Dulcolax, Ex-Lax)    °Laxatives are not a good long-term solution as it can stress the bowels and cause too much mineral loss and dehydration.   Avoid taking laxatives for more than 7 days in a row. ° °AVOID DIARRHEA °Switch to liquids and simpler foods for a few days to avoid stressing your intestines further. °Avoid dairy products (especially milk & ice cream) for a short time.  The intestines often can lose the ability to digest lactose when stressed. °Avoid foods that cause gassiness or bloating.  Typical foods include beans and other legumes, cabbage, broccoli, and dairy foods.  Every person has some sensitivity to other foods, so listen to your body and avoid those foods that trigger problems for you. °Adding fiber (Citrucel, Metamucil, FiberCon, Flax seed, Miralax) gradually can help thicken stools by absorbing excess fluid and retrain the intestines to act more normally.  Slowly increase the dose over a few weeks.  Too much fiber too soon can backfire and cause cramping & bloating. °Probiotics (such as active yogurt, Align, etc) may help repopulate the intestines and colon with normal bacteria and calm down a sensitive digestive tract.  Most studies show it to be of mild help, though, and such products can be costly. °Medicines: °Bismuth subsalicylate (ex. Kayopectate, Pepto Bismol) every 30 minutes for up to 6 doses can help control diarrhea.  Avoid if pregnant. °Loperamide (Immodium) can slow down diarrhea.  Start with two tablets (4mg total)  first and then try one tablet every 6 hours.  Avoid if you are having fevers or severe pain.  If you are not better or start feeling worse, stop all medicines and call your doctor for advice °Call your doctor if you are getting worse or not better.  Sometimes further testing (cultures, endoscopy, X-ray studies, bloodwork, etc) may be needed to help diagnose and treat the cause of the diarrhea. ° °TROUBLESHOOTING IRREGULAR BOWELS °1) Avoid extremes of bowel movements (no bad constipation/diarrhea) °2) Miralax 17gm mixed in 8oz. water or juice-daily. May use BID as needed.  °3) Gas-x,Phazyme, etc. as needed for gas & bloating.  °4) Soft,bland diet. No spicy,greasy,fried foods.  °5) Prilosec over-the-counter as needed  °6) May hold gluten/wheat products from diet to see if symptoms improve.  °7)  May try probiotics (Align, Activa, etc) to help calm the bowels down °7) If symptoms become worse call back immediately. ° ° °TREATMENT OF HEMORRHOID FLARE °If these preventive measures fail, you must take action right away! Hemorrhoids are one condition that can be mild in the morning and become intolerable by nightfall. °Most hemorrhoidal flares take several weeks to calm down.  These suggestions can help: °Warm soaks.  This helps more than any topical medication.  Use up to 8 times a day.  Usually sitz baths or sitting in a warm bathtub helps.  Sitting on moist warm towels are helpful.  Switching to ice packs/cool compresses can be helpful ° °Use a Sitz Bath 4-8 times a day for relief °A sitz bath is a warm water bath taken in the sitting position that covers only the hips and buttocks. It may be used for either healing or hygiene   purposes. Sitz baths are also used to relieve pain, itching, or muscle spasms. The water may contain medicine. Moist heat will help you heal and relax.  HOME CARE INSTRUCTIONS  Take 3 to 4 sitz baths a day. 1. Fill the bathtub half full with warm water. 2. Sit in the water and open the drain a  little. 3. Turn on the warm water to keep the tub half full. Keep the water running constantly. 4. Soak in the water for 15 to 20 minutes. 5. After the sitz bath, pat the affected area dry first. SEEK MEDICAL CARE IF:  You get worse instead of better. Stop the sitz baths if you get worse.  Normalize your bowels.  Extremes of diarrhea or constipation will make hemorrhoids worse.  One soft bowel movement a day is the goal.  Fiber can help get your bowels regular Wet wipes instead of toilet paper Pain control with a NSAID such as ibuprofen (Advil) or naproxen (Aleve) or acetaminophen (Tylenol) around the clock.  Narcotics are constipating and should be minimized if possible Topical creams contain steroids (bydrocortisone) or local anesthetic (xylocaine) can help make pain and itching more tolerable.   EVALUATION If hemorrhoids are still causing problems, you could benefit by an evaluation by a surgeon.  The surgeon will obtain a history and examine you.  If hemorrhoids are diagnosed, some therapies can be offered in the office, usually with an anoscope into the less sensitive area of the rectum: -injection of hemorrhoids (sclerotherapy) can scar the blood vessels of the swollen/enlarged hemorrhoids to help shrink them down to a more normal size -rubber banding of the enlarged hemorrhoids to help shrink them down to a more normal size -drainage of the blood clot causing a thrombosed hemorrhoid,  to relieve the severe pain   While 90% of the time such problems from hemorrhoids can be managed without preceding to surgery, sometimes the hemorrhoids require a operation to control the problem (uncontrolled bleeding, prolapse, pain, etc.).   This involves being placed under general anesthesia where the surgeon can confirm the diagnosis and remove, suture, or staple the hemorrhoid(s).  Your surgeon can help you treat the problem appropriately.     GETTING TO GOOD BOWEL  HEALTH.  ######################################################################  EAT Gradually transition to a high fiber diet with a fiber supplement over the next few weeks after discharge.  Start with a pureed / full liquid diet (see below)  WALK Walk an hour a day.  Control your pain to do that.    HAVE A BOWEL MOVEMENT DAILY Keep your bowels regular to avoid problems.  OK to try a laxative to override constipation.  OK to use an antidairrheal to slow down diarrhea.  Call if not better after 2 tries  CALL IF YOU HAVE PROBLEMS/CONCERNS Call if you are still struggling despite following these instructions. Call if you have concerns not answered by these instructions  ######################################################################   Irregular bowel habits such as constipation and diarrhea can lead to many problems over time.  Having one soft bowel movement a day is the most important way to prevent further problems.  The anorectal canal is designed to handle stretching and feces to safely manage our ability to get rid of solid waste (feces, poop, stool) out of our body.  BUT, hard constipated stools can act like ripping concrete bricks and diarrhea can be a burning fire to this very sensitive area of our body, causing inflamed hemorrhoids, anal fissures, increasing risk is perirectal abscesses, abdominal  pain/bloating, an making irritable bowel worse.      The goal: ONE SOFT BOWEL MOVEMENT A DAY!  To have soft, regular bowel movements:   Drink plenty of fluids, consider 4-6 tall glasses of water a day.    Take plenty of fiber.  Fiber is the undigested part of plant food that passes into the colon, acting s natures broom to encourage bowel motility and movement.  Fiber can absorb and hold large amounts of water. This results in a larger, bulkier stool, which is soft and easier to pass. Work gradually over several weeks up to 6 servings a day of fiber (25g a day even more if needed)  in the form of: o Vegetables -- Root (potatoes, carrots, turnips), leafy green (lettuce, salad greens, celery, spinach), or cooked high residue (cabbage, broccoli, etc) o Fruit -- Fresh (unpeeled skin & pulp), Dried (prunes, apricots, cherries, etc ),  or stewed ( applesauce)  o Whole grain breads, pasta, etc (whole wheat)  o Bran cereals   Bulking Agents -- This type of water-retaining fiber generally is easily obtained each day by one of the following:  o Psyllium bran -- The psyllium plant is remarkable because its ground seeds can retain so much water. This product is available as Metamucil, Konsyl, Effersyllium, Per Diem Fiber, or the less expensive generic preparation in drug and health food stores. Although labeled a laxative, it really is not a laxative.  o Methylcellulose -- This is another fiber derived from wood which also retains water. It is available as Citrucel. o Polyethylene Glycol - and artificial fiber commonly called Miralax or Glycolax.  It is helpful for people with gassy or bloated feelings with regular fiber o Flax Seed - a less gassy fiber than psyllium  No reading or other relaxing activity while on the toilet. If bowel movements take longer than 5 minutes, you are too constipated  AVOID CONSTIPATION.  High fiber and water intake usually takes care of this.  Sometimes a laxative is needed to stimulate more frequent bowel movements, but   Laxatives are not a good long-term solution as it can wear the colon out.  They can help jump-start bowels if constipated, but should be relied on constantly without discussing with your doctor o Osmotics (Milk of Magnesia, Fleets phosphosoda, Magnesium citrate, MiraLax, GoLytely) are safer than  o Stimulants (Senokot, Castor Oil, Dulcolax, Ex Lax)    o Avoid taking laxatives for more than 7 days in a row.   IF SEVERELY CONSTIPATED, try a Bowel Retraining Program: o Do not use laxatives.  o Eat a diet high in roughage, such as bran  cereals and leafy vegetables.  o Drink six (6) ounces of prune or apricot juice each morning.  o Eat two (2) large servings of stewed fruit each day.  o Take one (1) heaping tablespoon of a psyllium-based bulking agent twice a day. Use sugar-free sweetener when possible to avoid excessive calories.  o Eat a normal breakfast.  o Set aside 15 minutes after breakfast to sit on the toilet, but do not strain to have a bowel movement.  o If you do not have a bowel movement by the third day, use an enema and repeat the above steps.   Controlling diarrhea o Switch to liquids and simpler foods for a few days to avoid stressing your intestines further. o Avoid dairy products (especially milk & ice cream) for a short time.  The intestines often can lose the ability to digest lactose  when stressed. o Avoid foods that cause gassiness or bloating.  Typical foods include beans and other legumes, cabbage, broccoli, and dairy foods.  Every person has some sensitivity to other foods, so listen to our body and avoid those foods that trigger problems for you. o Adding fiber (Citrucel, Metamucil, psyllium, Miralax) gradually can help thicken stools by absorbing excess fluid and retrain the intestines to act more normally.  Slowly increase the dose over a few weeks.  Too much fiber too soon can backfire and cause cramping & bloating. o Probiotics (such as active yogurt, Align, etc) may help repopulate the intestines and colon with normal bacteria and calm down a sensitive digestive tract.  Most studies show it to be of mild help, though, and such products can be costly. o Medicines: - Bismuth subsalicylate (ex. Kayopectate, Pepto Bismol) every 30 minutes for up to 6 doses can help control diarrhea.  Avoid if pregnant. - Loperamide (Immodium) can slow down diarrhea.  Start with two tablets (4mg  total) first and then try one tablet every 6 hours.  Avoid if you are having fevers or severe pain.  If you are not better or  start feeling worse, stop all medicines and call your doctor for advice o Call your doctor if you are getting worse or not better.  Sometimes further testing (cultures, endoscopy, X-ray studies, bloodwork, etc) may be needed to help diagnose and treat the cause of the diarrhea.  TROUBLESHOOTING IRREGULAR BOWELS 1) Avoid extremes of bowel movements (no bad constipation/diarrhea) 2) Miralax 17gm mixed in 8oz. water or juice-daily. May use BID as needed.  3) Gas-x,Phazyme, etc. as needed for gas & bloating.  4) Soft,bland diet. No spicy,greasy,fried foods.  5) Prilosec over-the-counter as needed  6) May hold gluten/wheat products from diet to see if symptoms improve.  7)  May try probiotics (Align, Activa, etc) to help calm the bowels down 7) If symptoms become worse call back immediately.

## 2018-04-24 NOTE — Anesthesia Procedure Notes (Addendum)
Procedure Name: Intubation Date/Time: 04/24/2018 4:46 PM Performed by: Genelle Bal, CRNA Pre-anesthesia Checklist: Patient identified, Emergency Drugs available, Suction available and Patient being monitored Patient Re-evaluated:Patient Re-evaluated prior to induction Oxygen Delivery Method: Circle system utilized Preoxygenation: Pre-oxygenation with 100% oxygen Induction Type: IV induction Ventilation: Mask ventilation without difficulty Laryngoscope Size: Mac and 3 Grade View: Grade II Tube type: Oral Number of attempts: 1 Airway Equipment and Method: Stylet and Oral airway Placement Confirmation: ETT inserted through vocal cords under direct vision,  positive ETCO2 and breath sounds checked- equal and bilateral Secured at: 21 cm Tube secured with: Tape Dental Injury: Teeth and Oropharynx as per pre-operative assessment

## 2018-04-24 NOTE — Transfer of Care (Signed)
Immediate Anesthesia Transfer of Care Note  Patient: Gail KocherLeslie Anne Fields  Procedure(s) Performed: HEMORRHOIDECTOMY (N/A ) HEMORRHOIDAL LIGATION/PEXY (N/A ) ANORECTAL EXAM UNDER ANESTHESIA (N/A )  Patient Location: PACU  Anesthesia Type:General  Level of Consciousness: awake, alert , oriented and patient cooperative  Airway & Oxygen Therapy: Patient Spontanous Breathing and Patient connected to face mask oxygen  Post-op Assessment: Report given to RN, Post -op Vital signs reviewed and stable and Patient moving all extremities X 4  Post vital signs: stable  Last Vitals:  Vitals Value Taken Time  BP 123/70 04/24/2018  6:03 PM  Temp 36.9 C 04/24/2018  6:03 PM  Pulse 72 04/24/2018  6:13 PM  Resp 19 04/24/2018  6:13 PM  SpO2 100 % 04/24/2018  6:13 PM  Vitals shown include unvalidated device data.  Last Pain:  Vitals:   04/24/18 1803  TempSrc:   PainSc: Asleep         Complications: No apparent anesthesia complications

## 2018-04-24 NOTE — Op Note (Signed)
04/24/2018  5:50 PM  PATIENT:  Gail Fields  32 y.o. female  Patient Care Team: Lazaro ArmsEure, Luther H, MD as PCP - General (Obstetrics and Gynecology) Jena Gaussourk, Gerrit Friendsobert M, MD as Consulting Physician (Gastroenterology) Karie SodaGross, Vennesa Bastedo, MD as Consulting Physician (General Surgery) Lazaro ArmsEure, Luther H, MD as Consulting Physician (Obstetrics and Gynecology)  PRE-OPERATIVE DIAGNOSIS:  Hemorrhoids Prolapsed Grade 4. With bleeding and with pain  POST-OPERATIVE DIAGNOSIS:  Hemorrhoids Prolapsed Grade 3 & 4. With bleeding and with pain  PROCEDURE:    Internal and external hemorrhoidectomy  x2 Internal hemorrhoidal ligation and pexy Anorectal examination under anesthesia  SURGEON:  Ardeth SportsmanSteven C. Virda Betters, MD  ANESTHESIA:   General Anorectal & Local field block  0.25% bupivacaine with epinephrine at the beginning of the case. Liposomal bupivacaine (Experel) at the end of the case.  EBL:  Total I/O In: 1000 [I.V.:1000] Out: - .  See operative record  Delay start of Pharmacological VTE agent (>24hrs) due to surgical blood loss or risk of bleeding:  NO  DRAINS: NONE  SPECIMEN:   Internal & external hemorrhoidx2  DISPOSITION OF SPECIMEN:  PATHOLOGY  COUNTS:  YES  PLAN OF CARE: Discharge home after PACU  PATIENT DISPOSITION:  PACU - hemodynamically stable.  INDICATION: Pleasant patient with struggles with hemorrhoids.  Not able to be managed in the office despite an improved bowel regimen.  I recommended examination under anesthesia and surgical treatment:  The anatomy & physiology of the anorectal region was discussed.  The pathophysiology of hemorrhoids and differential diagnosis was discussed.  Natural history risks without surgery was discussed.   I stressed the importance of a bowel regimen to have daily soft bowel movements to minimize progression of disease.  Interventions such as sclerotherapy & banding were discussed.  The patient's symptoms are not adequately controlled by medicines and  other non-operative treatments.  I feel the risks & problems of no surgery outweigh the operative risks; therefore, I recommended surgery to treat the hemorrhoids by ligation, pexy, and possible resection.  Risks such as bleeding, infection, need for further treatment, heart attack, death, and other risks were discussed.   I noted a good likelihood this will help address the problem.  Goals of post-operative recovery were discussed as well.  Possibility that this will not correct all symptoms was explained.  Post-operative pain, bleeding, constipation, urinary difficulties, and other problems after surgery were discussed.  We will work to minimize complications.   Educational handouts further explaining the pathology, treatment options, and bowel regimen were given as well.  Questions were answered.  The patient expresses understanding & wishes to proceed with surgery.  OR FINDINGS: Left anterolateral > right posterior lateral grade 4 internal/external hemorrhoids.  Ligated and excised.  Right anterior grade 3 treated with ligation and pexy.  DESCRIPTION:   Informed consent was confirmed. Patient underwent general anesthesia without difficulty. Patient was placed into prone positioning.  The perianal region was prepped and draped in sterile fashion. Surgical time-out confirmed our plan.  I did digital rectal examination and then transitioned over to anoscopy to get a sense of the anatomy.  Findings noted above.   I proceeded to do hemorrhoidal ligation and pexy.  I used a 2-0 Vicryl suture on a UR-6 needle in a figure-of-eight fashion 6 cm proximal to the anal verge.  I started at the largest hemorrhoid pile.  Because of redundant hemorrhoidal tissue too bulky to merely ligate or pexy, I excised the excess internal hemorrhoid piles longitudinally in a fusiform biconcave fashion,  at the  left anterior and right posterior location, sparing the anal canal to avoid narrowing.  I then ran that stitch  longitudinally more distally to close the hemorrhoidectomy wound to the anal verge over a Parks self retaining retractor & occasionally a large Hill-Furgeson retractor to avoid narrowing of the anal canal.  I then tied that stitch down to cause a hemorrhoidopexy.   I then did hemorrhoidal ligation and pexy at the other 4 columns.  At the completion of this, all 6 anorectal columns were ligated and pexied in the classic hexagonal fashion (right anterior/lateral/posterior, left anterior/lateral/posterior).   There was some redundant external hemorrhoid tissue at the hemorrhoidectomy wound.  These were carefully trimmed down to have a more natural laying wound.  I I closed the external part of the hemorrhoidectomy wounds with interrupted horizontal mattress 2-0 chromic suture over a large Hill-Ferguson retractor, leaving the most external last 5 mm open to allow natural drainage.    I redid anoscopy & examination.  At completion of this, all hemorrhoids had been removed or reduced into the rectum.  There is no more prolapse.  Internal & external anatomy was more more normal.  Hemostasis was good.  Fluffed gauze was on-laid over the perianal region.  No packing done.  Patient is being extubated go to go to the recovery room.  I had discussed postop care in detail with the patient in the preop holding area.  Instructions for post-operative recovery and prescriptions are written. I discussed operative findings, updated the patient's status, discussed probable steps to recovery, and gave postoperative recommendations to the Patient's friend..  Recommendations were made.  Questions were answered.  She expressed understanding & appreciation.  [name], M.D., F.A.C.S. Gastrointestinal and Minimally Invasive Surgery [facility] [address], [address] [place], [place] [address] [phone] Main / Paging

## 2018-04-24 NOTE — Anesthesia Postprocedure Evaluation (Signed)
Anesthesia Post Note  Patient: Gail KocherLeslie Anne Fields  Procedure(s) Performed: HEMORRHOIDECTOMY (N/A ) HEMORRHOIDAL LIGATION/PEXY (N/A ) ANORECTAL EXAM UNDER ANESTHESIA (N/A )     Patient location during evaluation: PACU Anesthesia Type: General Level of consciousness: awake and alert Pain management: pain level controlled Vital Signs Assessment: post-procedure vital signs reviewed and stable Respiratory status: spontaneous breathing, nonlabored ventilation, respiratory function stable and patient connected to nasal cannula oxygen Cardiovascular status: blood pressure returned to baseline and stable Postop Assessment: no apparent nausea or vomiting Anesthetic complications: no    Last Vitals:  Vitals:   04/24/18 1830 04/24/18 1845  BP: 119/66 119/68  Pulse: 73 85  Resp: 18 (!) 23  Temp:    SpO2: 100% 99%    Last Pain:  Vitals:   04/24/18 1845  TempSrc:   PainSc: Asleep                 Rihan Schueler S

## 2018-04-24 NOTE — Interval H&P Note (Signed)
History and Physical Interval Note:  04/24/2018 4:01 PM  Gail Fields  has presented today for surgery, with the diagnosis of Hemorrhoids Prolapsed Grade 4. With bleeding and with pain  The various methods of treatment have been discussed with the patient and family. After consideration of risks, benefits and other options for treatment, the patient has consented to  Procedure(s): HEMORRHOIDECTOMY (N/A) HEMORRHOIDAL LIGATION/PEXY (N/A) ANORECTAL EXAM UNDER ANESTHESIA (N/A) as a surgical intervention .  The patient's history has been reviewed, patient examined, no change in status, stable for surgery.  I have reviewed the patient's chart and labs.  Questions were answered to the patient's satisfaction.    I have re-reviewed the the patient's records, history, medications, and allergies.  I have re-examined the patient.  I again discussed intraoperative plans and goals of post-operative recovery.  The patient agrees to proceed.  Gail Fields  Jun 05, 1986 [id number]  Patient Care Team: Florian Buff, MD as PCP - General (Obstetrics and Gynecology) Gala Romney Cristopher Estimable, MD as Consulting Physician (Gastroenterology) Michael Boston, MD as Consulting Physician (General Surgery) Florian Buff, MD as Consulting Physician (Obstetrics and Gynecology)  Patient Active Problem List   Diagnosis Date Noted  . Dyspareunia due to medical condition in female 12/06/2016  . 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 Polyp at cervical os 10/13/2014  . 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 Acute cystitis   . BV (bacterial vaginosis)   . Hemorrhoid   . Hx of chlamydia infection   . Patellofemoral stress syndrome   . 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 Trichimoniasis   . Vaginal Pap smear, abnormal 09/22/14   ASCUS- +HPV  . Yeast infection      Past Surgical History:  Procedure Laterality Date  . NO PAST SURGERIES      Social History   Socioeconomic History  . Marital status: Single    Spouse name: Not on file  . Number of children: Not on file  . Years of education: Not on file  . Highest education level: Not on file  Occupational History  . Not on file  Social Needs  . Financial resource strain: Not on file  . Food insecurity:    Worry: Not on file    Inability: Not on file  . Transportation needs:    Medical: Not on file    Non-medical: Not on file  Tobacco Use  . Smoking status: Never Smoker  . Smokeless tobacco: Never Used  Substance and Sexual Activity  . Alcohol use: No  . Drug use: No  . Sexual activity: Yes    Birth control/protection: Injection  Lifestyle  . Physical activity:    Days per week: Not on file    Minutes per session: Not on file  . Stress: Not on file  Relationships  . Social connections:    Talks on phone: Not on file    Gets together: Not on file    Attends religious service: Not on file    Active member of club or organization: Not on file    Attends meetings of clubs or organizations: Not on file    Relationship status: Not on file  . Intimate partner violence:    Fear of current or ex partner: Not on file    Emotionally abused: Not on file    Physically abused: Not  on file    Forced sexual activity: Not on file  Other Topics Concern  . Not on file  Social History Narrative  . Not on file    Family History  Problem Relation Age of Onset  . Cancer Maternal Grandmother        breast  . Seizures Paternal Grandmother     Medications Prior to Admission  Medication Sig Dispense Refill Last Dose  . acetaminophen (TYLENOL) 325 MG tablet Take 325-650 mg by mouth every 6 (six) hours as needed for moderate pain or headache.    Taking  . Prenatal Vit-Fe Fumarate-FA (PRENATAL PO) Take 1 tablet by mouth daily.   04/23/2018 at Unknown time  . ibuprofen (ADVIL,MOTRIN) 600 MG  tablet Take 1 tablet (600 mg total) by mouth every 6 (six) hours. (Patient not taking: Reported on 04/18/2018) 30 tablet 0 Completed Course at Unknown time  . medroxyPROGESTERone (DEPO-PROVERA) 150 MG/ML injection Inject 1 mL (150 mg total) into the muscle every 3 (three) months. 1 mL 3 More than a month at Unknown time  . nitroGLYCERIN (NITROGLYN) 2 % ointment Apply 0.5 inches topically 4 (four) times daily. (Patient not taking: Reported on 04/18/2018) 30 g 1 Not Taking at Unknown time  . PRENATAL 27-1 MG TABS TAKE 1 TABLET BY MOUTH ONCE DAILY. (Patient not taking: Reported on 04/18/2018) 30 tablet 0 Not Taking at Unknown time  . prenatal vitamin w/FE, FA (PRENATAL 1 + 1) 27-1 MG TABS tablet Take 1 tablet daily at 12 noon by mouth. (Patient not taking: Reported on 04/18/2018) 30 each 12 Not Taking at Unknown time    Current Facility-Administered Medications  Medication Dose Route Frequency Provider Last Rate Last Dose  . bupivacaine liposome (EXPAREL) 1.3 % injection 266 mg  20 mL Infiltration On Call to OR Michael Boston, MD      . ceFAZolin (ANCEF) IVPB 2g/100 mL premix  2 g Intravenous On Call to OR Michael Boston, MD       And  . metroNIDAZOLE (FLAGYL) IVPB 500 mg  500 mg Intravenous On Call to OR Michael Boston, MD      . Chlorhexidine Gluconate Cloth 2 % PADS 6 each  6 each Topical Once Michael Boston, MD       And  . Chlorhexidine Gluconate Cloth 2 % PADS 6 each  6 each Topical Once Michael Boston, MD      . fentaNYL (SUBLIMAZE) injection 25-50 mcg  25-50 mcg Intravenous Q5 min PRN Myrtie Soman, MD      . ketorolac (TORADOL) 30 MG/ML injection 30 mg  30 mg Intravenous Once PRN Myrtie Soman, MD      . lactated ringers infusion   Intravenous Continuous Janeece Riggers, MD      . lactated ringers infusion   Intravenous Continuous Janeece Riggers, MD 20 mL/hr at 04/24/18 1210    . oxyCODONE (Oxy IR/ROXICODONE) immediate release tablet 5 mg  5 mg Oral Once PRN Myrtie Soman, MD       Or  . oxyCODONE  (ROXICODONE) 5 MG/5ML solution 5 mg  5 mg Oral Once PRN Myrtie Soman, MD      . promethazine (PHENERGAN) injection 6.25-12.5 mg  6.25-12.5 mg Intravenous Q15 min PRN Myrtie Soman, MD       Facility-Administered Medications Ordered in Other Encounters  Medication Dose Route Frequency Provider Last Rate Last Dose  . lidocaine 2 % w/epi 1:200,000 (20 ml) with sod bicarb 8.4 % (2 ml) inj    Continuous PRN  Gilmer Mor R, CRNA   5 mL at 08/18/15 2327     No Known Allergies  BP 115/69   Pulse 62   Temp 98.4 F (36.9 C) (Oral)   Resp 16   Ht _0  (1.676 m)   Wt 72.6 kg (160 lb)   LMP 04/06/2018 (Approximate)   SpO2 100%   BMI 25.82 kg/m   Labs: Results for orders placed or performed during the hospital encounter of 04/23/18 (from the past 48 hour(s))  Basic metabolic panel     Status: None   Collection Time: 04/23/18  1:51 PM  Result Value Ref Range   Sodium 142 135 - 145 mmol/L   Potassium 4.5 3.5 - 5.1 mmol/L   Chloride 106 101 - 111 mmol/L   CO2 29 22 - 32 mmol/L   Glucose, Bld 93 65 - 99 mg/dL   BUN 11 6 - 20 mg/dL   Creatinine, Ser 0.89 0.44 - 1.00 mg/dL   Calcium 9.6 8.9 - 10.3 mg/dL   GFR calc non Af Amer >60 >60 mL/min   GFR calc Af Amer >60 >60 mL/min    Comment: (NOTE) The eGFR has been calculated using the CKD EPI equation. This calculation has not been validated in all clinical situations. eGFR's persistently <60 mL/min signify possible Chronic Kidney Disease.    Anion gap 7 5 - 15    Comment: Performed at [facility], [place] [address] [place], [place] [address]  CBC     Status: None   Collection Time: 04/23/18  1:51 PM  Result Value Ref Range   WBC 8.1 4.0 - 10.5 K/uL   RBC 4.21 3.87 - 5.11 MIL/uL   Hemoglobin 12.7 12.0 - 15.0 g/dL   HCT 37.8 36.0 - 46.0 %   MCV 89.8 78.0 - 100.0 fL   MCH 30.2 26.0 - 34.0 pg   MCHC 33.6 30.0 - 36.0 g/dL   RDW 12.3 11.5 - 15.5 %   Platelets 263 150 - 400 K/uL    Comment: Performed at [facility], [place] [address], [place], [place] [address]  hCG, serum, qualitative     Status: None   Collection Time: 04/23/18  1:51 PM  Result Value Ref Range   Preg, Serum NEGATIVE NEGATIVE    Comment:        THE SENSITIVITY OF THIS METHODOLOGY IS >10 mIU/mL. Performed at [facility], [place] [address], [place], [place] [address]     Imaging / Studies: No results found.   Adin Hector, M.D., F.A.C.S. Gastrointestinal and Minimally Invasive Surgery [facility] [address], [place], [place] [address] [phone] Main / Paging  04/24/2018 4:01 PM     Adin Hector

## 2018-04-24 NOTE — H&P (Signed)
Gail CalkinLeslie A Fields DOB: 30-Dec-1985  ` 04/24/2018 Patient Care Team: Gail Fields, Gail Fields, Fields as PCP - General (Obstetrics and Gynecology) Gail Fields, Gail Friendsobert Fields, Fields as Consulting Physician (Gastroenterology) Gail Fields, Gail Fields as Consulting Physician (General Surgery) Gail Fields, Gail Fields, Fields as Consulting Physician (Obstetrics and Gynecology)   ` Patient sent for surgical consultation at the request of Dr. Duane LopeLuther Fields  Chief Complaint: Worsening hemorrhoids  The patient is a young woman status post vaginal delivery November 14, 2017. She had some intermittent hemorrhoid problems. They've been a persistent problem postpartum. Discuss with her OB/GYN. External hemorrhoids noted. Sent for consultation with gastroenterology apparently for banding. Redirected to surgery. BM 2x a week on Colace. Was on Miralax in past. Feels "bubbles around my butthole." PAin w defectaion & prolonged sitting. No help with Anusol cream. No help with nitroglycerin cream. She moves her bowels twice a week at best. Claims MiraLAX didn't work for her well but only took 1 or 2 doses a day. No family history of bowel problems. She does not smoke. She can walk several miles without difficulty. No history of diabetes or other peripartum issues. She's never had any anorectal interventions. This hemorrhoid problem started with her first pregnancy of her son in 2016. Things seemed to improve a little bit but then flare back up with this second pregnancy of her daughter that she delivered earlier this year.  No personal nor family history of GI/colon cancer, inflammatory bowel disease, irritable bowel syndrome, allergy such as Celiac Sprue, dietary/dairy problems, colitis, ulcers nor gastritis. No recent sick contacts/gastroenteritis. No travel outside the country. No changes in diet. No dysphagia to solids or liquids. No significant heartburn or reflux. No hematochezia, hematemesis, coffee ground emesis. No evidence of prior  gastric/peptic ulceration.  (Review of systems as stated in this history (HPI) or in the review of systems. Otherwise all other 12 point ROS are negative) ` ` `   Past Surgical History Gail Fields(Gail Fields; 03/18/2018 2:43 PM) No pertinent past surgical history  Diagnostic Studies History Gail Fields(Gail Fields; 03/18/2018 2:43 PM) Colonoscopy never Mammogram never Pap Smear 1-5 years ago  Allergies Gail Fields(Gail Fields; 03/18/2018 2:44 PM) Allergies Reconciled  Medication History Gail Fields(Gail Fields; 03/18/2018 2:45 PM) Nitroglycerin (0.4MG Olive Bass/SPRAY Solution, Translingual) Active. Prenatal (27-1MG  Tablet, Oral) Active. Medications Reconciled  Pregnancy / Birth History Gail Fields(Gail Fields; 03/18/2018 2:43 PM) Age at menarche 12 years. Contraceptive History Depo-provera. Gravida 3 Maternal age 33-30 Para 2  Other Problems Gail Fields(Gail Fields; 03/18/2018 2:43 PM) Hemorrhoids     Review of Systems Gail Fields(Gail Fields; 03/18/2018 2:43 PM) General Not Present- Appetite Loss, Chills, Fatigue, Fever, Night Sweats, Weight Gain and Weight Loss. Skin Not Present- Change in Wart/Mole, Dryness, Hives, Jaundice, New Lesions, Non-Healing Wounds, Rash and Ulcer. HEENT Present- Seasonal Allergies and Wears glasses/contact lenses. Not Present- Earache, Hearing Loss, Hoarseness, Nose Bleed, Oral Ulcers, Ringing in the Ears, Sinus Pain, Sore Throat, Visual Disturbances and Yellow Eyes. Respiratory Not Present- Bloody sputum, Chronic Cough, Difficulty Breathing, Snoring and Wheezing. Breast Not Present- Breast Mass, Breast Pain, Nipple Discharge and Skin Changes. Cardiovascular Not Present- Chest Pain, Difficulty Breathing Lying Down, Leg Cramps, Palpitations, Rapid Heart Rate, Shortness of Breath and Swelling of Extremities. Gastrointestinal Present- Constipation, Hemorrhoids, Indigestion and Rectal Pain. Not Present- Abdominal Pain, Bloating, Bloody Stool, Change in Bowel Habits, Chronic diarrhea, Difficulty  Swallowing, Excessive gas, Gets full quickly at meals, Nausea and Vomiting. Female Genitourinary Not Present- Frequency, Nocturia, Painful Urination, Pelvic Pain and Urgency. Musculoskeletal Not Present- Back Pain, Joint Pain, Joint Stiffness, Muscle  Pain, Muscle Weakness and Swelling of Extremities. Neurological Not Present- Decreased Memory, Fainting, Headaches, Numbness, Seizures, Tingling, Tremor, Trouble walking and Weakness. Psychiatric Not Present- Anxiety, Bipolar, Change in Sleep Pattern, Depression, Fearful and Frequent crying. Endocrine Not Present- Cold Intolerance, Excessive Hunger, Hair Changes, Heat Intolerance, Hot flashes and New Diabetes. Hematology Not Present- Blood Thinners, Easy Bruising, Excessive bleeding, Gland problems, HIV and Persistent Infections.  Vitals Gail Fields; 03/18/2018 2:45 PM) 03/18/2018 2:45 PM Weight: 166.38 lb Height: 66in Body Surface Area: 1.85 Fields Body Mass Index: 26.85 kg/Fields  Temp.: 98.53F(Oral)  Pulse: 62 (Regular)  BP: 110/72 (Sitting, Left Arm, Standard)  BP 115/69   Pulse 62   Temp 98.4 F (36.9 C) (Oral)   Resp 16   Ht 5\' 6"  (1.676 Fields)   Wt 72.6 kg (160 lb)   LMP 04/06/2018 (Approximate)   SpO2 100%   BMI 25.82 kg/Fields      Physical Exam Gail Sportsman Fields; 03/18/2018 3:14 PM)  General Mental Status-Alert. General Appearance-Not in acute distress, Not Sickly. Orientation-Oriented X3. Hydration-Well hydrated. Voice-Normal.  Integumentary Global Assessment Upon inspection and palpation of skin surfaces of the - Axillae: non-tender, no inflammation or ulceration, no drainage. and Distribution of scalp and body hair is normal. General Characteristics Temperature - normal warmth is noted.  Head and Neck Head-normocephalic, atraumatic with no lesions or palpable masses. Face Global Assessment - atraumatic, no absence of expression. Neck Global Assessment - no abnormal movements, no bruit  auscultated on the right, no bruit auscultated on the left, no decreased range of motion, non-tender. Trachea-midline. Thyroid Gland Characteristics - non-tender.  Eye Eyeball - Left-Extraocular movements intact, No Nystagmus. Eyeball - Right-Extraocular movements intact, No Nystagmus. Cornea - Left-No Hazy. Cornea - Right-No Hazy. Sclera/Conjunctiva - Left-No scleral icterus, No Discharge. Sclera/Conjunctiva - Right-No scleral icterus, No Discharge. Pupil - Left-Direct reaction to light normal. Pupil - Right-Direct reaction to light normal.  ENMT Ears Pinna - Left - no drainage observed, no generalized tenderness observed. Right - no drainage observed, no generalized tenderness observed. Nose and Sinuses External Inspection of the Nose - no destructive lesion observed. Inspection of the nares - Left - quiet respiration. Right - quiet respiration. Mouth and Throat Lips - Upper Lip - no fissures observed, no pallor noted. Lower Lip - no fissures observed, no pallor noted. Nasopharynx - no discharge present. Oral Cavity/Oropharynx - Tongue - no dryness observed. Oral Mucosa - no cyanosis observed. Hypopharynx - no evidence of airway distress observed.  Chest and Lung Exam Inspection Movements - Normal and Symmetrical. Accessory muscles - No use of accessory muscles in breathing. Palpation Palpation of the chest reveals - Non-tender. Auscultation Breath sounds - Normal and Clear.  Cardiovascular Auscultation Rhythm - Regular. Murmurs & Other Heart Sounds - Auscultation of the heart reveals - No Murmurs and No Systolic Clicks.  Abdomen Inspection Inspection of the abdomen reveals - No Visible peristalsis and No Abnormal pulsations. Umbilicus - No Bleeding, No Urine drainage. Palpation/Percussion Palpation and Percussion of the abdomen reveal - Soft, Non Tender, No Rebound tenderness, No Rigidity (guarding) and No Cutaneous hyperesthesia. Note: Abdomen soft.  Not severely distended. No distasis recti. No umbilical or other anterior abdominal wall hernias  Female Genitourinary Sexual Maturity Tanner 5 - Adult hair pattern. Note: No vaginal bleeding nor discharge  Rectal Note: ` ` ` Left lateral large prolapsed internal hemorrhoid with significant external component. Not thrombosed. No particular inflamed.  Perianal skin clean with good hygiene. No pruritis ani. No pilonidal disease.  No fissure. No abscess/fistula. Increased sphincter tone. No other hemorrhoids. No condyloma warts. Barely tolerates the finger. Grade 3 internal hemorrhoids on the right side. No obvious thrombus felt. No mass. She cannot tolerate in anoscope. Exam done with assistance of female FM resident in the room.  Peripheral Vascular Upper Extremity Inspection - Left - No Cyanotic nailbeds, Not Ischemic. Right - No Cyanotic nailbeds, Not Ischemic.  Neurologic Neurologic evaluation reveals -normal attention span and ability to concentrate, able to name objects and repeat phrases. Appropriate fund of knowledge , normal sensation and normal coordination. Mental Status Affect - not angry, not paranoid. Cranial Nerves-Normal Bilaterally. Gait-Normal.  Neuropsychiatric Mental status exam performed with findings of-able to articulate well with normal speech/language, rate, volume and coherence, thought content normal with ability to perform basic computations and apply abstract reasoning and no evidence of hallucinations, delusions, obsessions or homicidal/suicidal ideation.  Musculoskeletal Global Assessment Spine, Ribs and Pelvis - no instability, subluxation or laxity. Right Upper Extremity - no instability, subluxation or laxity.  Lymphatic Head & Neck  General Head & Neck Lymphatics: Bilateral - Description - No Localized lymphadenopathy. Axillary  General Axillary Region: Bilateral - Description - No Localized lymphadenopathy. Femoral &  Inguinal  Generalized Femoral & Inguinal Lymphatics: Left - Description - No Localized lymphadenopathy. Right - Description - No Localized lymphadenopathy.    Assessment & Plan   PROLAPSED INTERNAL HEMORRHOIDS, GRADE 4 (K64.3) Symptomatic hemorrhoids with external component.  Too sensitive to manage in office.  I recommended operative evaluation and hemorrhoidal ligation pexy and probable hemorrhoidectomy.  She is ready to consider surgery.   The anatomy and the physiology was discussed. The pathophysiology and natural history of the disease was discussed. Options were discussed and recommendations were made. Technique, risks, benefits, & alternatives were discussed. Risks such as stroke, heart attack, bleeding, indection, death, and other risks discussed. Questions answered. The patient agrees to proceed. Pt Education - [facility] Rectal Prep for Anorectal outpatient/office surgery: discussed with patient and provided information. Pt Education - [facility] Rectal Surgery HCI (Jhane Lorio): discussed with patient and provided information.  CHRONIC CONSTIPATION (K59.09)  Current Plans Pt Education - [facility] Constipation (AT) Pt Education - [facility] Good Bowel Health (Winslow Verrill)  Gail Fields, Fields.D., F.A.C.S. Gastrointestinal and Minimally Invasive Surgery [facility] [address] [place], [place] [address] [phone] Main / Paging

## 2018-04-25 ENCOUNTER — Encounter (HOSPITAL_COMMUNITY): Payer: Self-pay | Admitting: Surgery

## 2018-04-27 ENCOUNTER — Other Ambulatory Visit: Payer: Self-pay | Admitting: Surgery

## 2018-04-27 ENCOUNTER — Telehealth: Payer: Self-pay | Admitting: Surgery

## 2018-04-27 NOTE — Telephone Encounter (Signed)
Gail Fields  1986-01-13 [id number]  Patient Care Team: Lazaro ArmsEure, Luther H, MD as PCP - General (Obstetrics and Gynecology) Jena Gaussourk, Gerrit Friendsobert M, MD as Consulting Physician (Gastroenterology) Karie SodaGross, Mychael Soots, MD as Consulting Physician (General Surgery) Lazaro ArmsEure, Luther H, MD as Consulting Physician (Obstetrics and Gynecology)  This patient is a 32 y.o.female who calls today for surgical evaluation.   Date of procedure/visit: 04/24/2018  POST-OPERATIVE DIAGNOSIS:  Hemorrhoids Prolapsed Grade 3 & 4. With bleeding and with pain  PROCEDURE:    Internal and external hemorrhoidectomy  x2 Internal hemorrhoidal ligation and pexy Anorectal examination under anesthesia  SURGEON:  Ardeth SportsmanSteven C. Nolia Tschantz, MD     Reason for call: Patient status post hemorrhoidectomy struggling with control with oxycodone naproxen and soaks.  Moving her bowels.  Tolerating solid food.  No nausea or vomiting.  No severe bleeding.  I recommend she continue naproxen 2 pills twice a day.  Increase soaks to 6 times a day.  Can increase oxycodone over 10 mg as needed.  She cannot get the diltiazem filled since insurance will not cover it and compounding pharmacist that does cover it is over an hour away.  Request refills on Monday.  I will add gabapentin.  She expressed understanding and appreciation    Patient Active Problem List   Diagnosis Date Noted  . Breast-feeding mother 04/24/2018  . Prolapsed hemorrhoids, grade 4, s/p ligation/pexy/hemorrhoidectomy 04/24/2018 04/24/2018  . 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 Dyspareunia due to medical condition in female 12/06/2016  . 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 Polyp at cervical os 10/13/2014  . 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 Acute cystitis   . BV (bacterial vaginosis)   . Hemorrhoid   . Hx of  chlamydia infection   . Patellofemoral stress syndrome   . 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 Trichimoniasis   . Vaginal Pap smear, abnormal 09/22/14   ASCUS- +HPV  . Yeast infection     Past Surgical History:  Procedure Laterality Date  . HEMORRHOID SURGERY N/A 04/24/2018   Procedure: HEMORRHOIDECTOMY;  Surgeon: Karie SodaGross, Avedis Bevis, MD;  Location: [facility];  Service: General;  Laterality: N/A;  . NO PAST SURGERIES    . PEXY N/A 04/24/2018   Procedure: HEMORRHOIDAL LIGATION/PEXY;  Surgeon: Karie SodaGross, Jera Headings, MD;  Location: [facility];  Service: General;  Laterality: N/A;  . RECTAL EXAM UNDER ANESTHESIA N/A 04/24/2018   Procedure: ANORECTAL EXAM UNDER ANESTHESIA;  Surgeon: Karie SodaGross, Javaris Wigington, MD;  Location: [facility];  Service: General;  Laterality: N/A;    Social History   Socioeconomic History  . Marital status: Single    Spouse name: Not on file  . Number of children: Not on file  . Years of education: Not on file  . Highest education level: Not on file  Occupational History  . Not on file  Social Needs  . Financial resource strain: Not on file  . Food insecurity:    Worry: Not on file    Inability: Not on file  . Transportation needs:    Medical: Not on file    Non-medical: Not on file  Tobacco Use  . Smoking status: Never Smoker  . Smokeless tobacco: Never Used  Substance and Sexual Activity  . Alcohol use: No  . Drug use: No  . Sexual activity: Yes    Birth control/protection: Injection  Lifestyle  . Physical  activity:    Days per week: Not on file    Minutes per session: Not on file  . Stress: Not on file  Relationships  . Social connections:    Talks on phone: Not on file    Gets together: Not on file    Attends religious service: Not on file    Active member of club or organization: Not on file    Attends meetings of clubs or organizations: Not on file    Relationship status: Not on file  . Intimate partner violence:    Fear of current or ex partner: Not on file    Emotionally abused:  Not on file    Physically abused: Not on file    Forced sexual activity: Not on file  Other Topics Concern  . Not on file  Social History Narrative  . Not on file    Family History  Problem Relation Age of Onset  . Cancer Maternal Grandmother        breast  . Seizures Paternal Grandmother     Current Outpatient Medications  Medication Sig Dispense Refill  . acetaminophen (TYLENOL) 325 MG tablet Take 325-650 mg by mouth every 6 (six) hours as needed for moderate pain or headache.     . AMBULATORY NON FORMULARY MEDICATION Place 1 application rectally 4 (four) times daily. DILTIAZEM 2% compounded suspension.  (Get Rx filled at a compounding pharmacy, such as [facility] [phone] or [facility] [phone]  in [place], [place]) 1 Tube 2  . ibuprofen (ADVIL,MOTRIN) 600 MG tablet Take 1 tablet (600 mg total) by mouth every 6 (six) hours. (Patient not taking: Reported on 04/18/2018) 30 tablet 0  . medroxyPROGESTERone (DEPO-PROVERA) 150 MG/ML injection Inject 1 mL (150 mg total) into the muscle every 3 (three) months. 1 mL 3  . oxyCODONE (OXY IR/ROXICODONE) 5 MG immediate release tablet Take 1-2 tablets (5-10 mg total) by mouth every 6 (six) hours as needed for moderate pain, severe pain or breakthrough pain. 40 tablet 0  . Prenatal Vit-Fe Fumarate-FA (PRENATAL PO) Take 1 tablet by mouth daily.     No current facility-administered medications for this visit.    Facility-Administered Medications Ordered in Other Visits  Medication Dose Route Frequency Provider Last Rate Last Dose  . lidocaine 2 % w/epi 1:200,000 (20 ml) with sod bicarb 8.4 % (2 ml) inj    Continuous PRN Marrion Coy R, CRNA   5 mL at 08/18/15 2327     No Known Allergies  @VS @  No results found.  Note: This dictation was prepared with Dragon/digital dictation along with [employer]. Any transcriptional errors that result from this process are unintentional.   .[name], M.D.,  F.A.C.S. Gastrointestinal and Minimally Invasive Surgery [facility] [address] [place], [place] [address] [phone] Main / Paging  04/27/2018 7:59 AM

## 2018-04-29 ENCOUNTER — Ambulatory Visit: Payer: BC Managed Care – PPO

## 2018-04-30 ENCOUNTER — Encounter: Payer: Self-pay | Admitting: *Deleted

## 2018-04-30 ENCOUNTER — Ambulatory Visit (INDEPENDENT_AMBULATORY_CARE_PROVIDER_SITE_OTHER): Payer: BC Managed Care – PPO | Admitting: *Deleted

## 2018-04-30 DIAGNOSIS — Z3042 Encounter for surveillance of injectable contraceptive: Secondary | ICD-10-CM

## 2018-04-30 DIAGNOSIS — Z308 Encounter for other contraceptive management: Secondary | ICD-10-CM

## 2018-04-30 MED ORDER — MEDROXYPROGESTERONE ACETATE 150 MG/ML IM SUSP
150.0000 mg | Freq: Once | INTRAMUSCULAR | Status: AC
  Administered 2018-04-30: 150 mg via INTRAMUSCULAR

## 2018-04-30 NOTE — Progress Notes (Signed)
Depo Provera 150 mg given IM in right deltoid. Patient tolerated well. Instructed to return in 12 weeks.

## 2018-07-23 ENCOUNTER — Ambulatory Visit: Payer: BC Managed Care – PPO

## 2018-07-25 ENCOUNTER — Ambulatory Visit (INDEPENDENT_AMBULATORY_CARE_PROVIDER_SITE_OTHER): Payer: BC Managed Care – PPO

## 2018-07-25 VITALS — Ht 66.2 in | Wt 163.0 lb

## 2018-07-25 DIAGNOSIS — Z3202 Encounter for pregnancy test, result negative: Secondary | ICD-10-CM

## 2018-07-25 DIAGNOSIS — Z3042 Encounter for surveillance of injectable contraceptive: Secondary | ICD-10-CM

## 2018-07-25 LAB — POCT URINE PREGNANCY: Preg Test, Ur: NEGATIVE

## 2018-07-25 MED ORDER — MEDROXYPROGESTERONE ACETATE 150 MG/ML IM SUSP
150.0000 mg | Freq: Once | INTRAMUSCULAR | Status: AC
Start: 2018-07-25 — End: 2018-07-25
  Administered 2018-07-25: 150 mg via INTRAMUSCULAR

## 2018-07-25 NOTE — Progress Notes (Signed)
Pt here for depo injection 150 mg IM given lt deltoid. Tolerated well.Return 12 weeks for next injection. Pad CMA 

## 2018-10-17 ENCOUNTER — Ambulatory Visit: Payer: BC Managed Care – PPO

## 2018-10-24 ENCOUNTER — Ambulatory Visit (INDEPENDENT_AMBULATORY_CARE_PROVIDER_SITE_OTHER): Payer: BC Managed Care – PPO | Admitting: Obstetrics and Gynecology

## 2018-10-24 ENCOUNTER — Ambulatory Visit (INDEPENDENT_AMBULATORY_CARE_PROVIDER_SITE_OTHER): Payer: BC Managed Care – PPO | Admitting: *Deleted

## 2018-10-24 ENCOUNTER — Encounter: Payer: Self-pay | Admitting: *Deleted

## 2018-10-24 VITALS — BP 110/65 | HR 72 | Ht 66.5 in | Wt 167.2 lb

## 2018-10-24 DIAGNOSIS — Z3202 Encounter for pregnancy test, result negative: Secondary | ICD-10-CM | POA: Diagnosis not present

## 2018-10-24 DIAGNOSIS — Z3042 Encounter for surveillance of injectable contraceptive: Secondary | ICD-10-CM

## 2018-10-24 DIAGNOSIS — N898 Other specified noninflammatory disorders of vagina: Secondary | ICD-10-CM

## 2018-10-24 LAB — POCT URINE PREGNANCY: PREG TEST UR: NEGATIVE

## 2018-10-24 MED ORDER — MEDROXYPROGESTERONE ACETATE 150 MG/ML IM SUSP
150.0000 mg | Freq: Once | INTRAMUSCULAR | Status: AC
  Administered 2018-10-24: 150 mg via INTRAMUSCULAR

## 2018-10-24 MED ORDER — PRENATAL 27-0.8 MG PO TABS
1.0000 | ORAL_TABLET | Freq: Every day | ORAL | 3 refills | Status: DC
Start: 2018-10-24 — End: 2019-07-03

## 2018-10-24 MED ORDER — NYSTATIN-TRIAMCINOLONE 100000-0.1 UNIT/GM-% EX OINT
1.0000 | TOPICAL_OINTMENT | Freq: Two times a day (BID) | CUTANEOUS | 99 refills | Status: DC
Start: 2018-10-24 — End: 2019-04-10

## 2018-10-24 NOTE — Progress Notes (Signed)
Patient ID: Gail KocherLeslie Anne Fields, female   DOB: 11-15-1985, 32 y.o.   MRN: [id number]   [facility] Visit  @DATE @            Patient name: Gail Fields MRN [id number]  Date of birth: 11-15-1985  CC & HPI:  Gail Fields is a 32 y.o. female presenting today for possible yeast infection. She says it is itching with extra moisture. Same partner. No hx of diabetes. Is on Depo.  Stable partner times multiple years  ROS:  ROS +vaginal itching with extra moisture -fever -chills All systems are negative except as noted in the HPI and PMH.   Pertinent History Reviewed:   Reviewed:  Medical         Past Medical History:  Diagnosis Date  . Acute cystitis   . BV (bacterial vaginosis)   . Hemorrhoid   . Hx of chlamydia infection   . Patellofemoral stress syndrome   . 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 Trichimoniasis   . Vaginal Pap smear, abnormal 09/22/14   ASCUS- +HPV  . Yeast infection                               Surgical Hx:    Past Surgical History:  Procedure Laterality Date  . HEMORRHOID SURGERY N/A 04/24/2018   Procedure: HEMORRHOIDECTOMY;  Surgeon: Karie SodaGross, Steven, MD;  Location: [facility];  Service: General;  Laterality: N/A;  . NO PAST SURGERIES    . PEXY N/A 04/24/2018   Procedure: HEMORRHOIDAL LIGATION/PEXY;  Surgeon: Karie SodaGross, Steven, MD;  Location: [facility];  Service: General;  Laterality: N/A;  . RECTAL EXAM UNDER ANESTHESIA N/A 04/24/2018   Procedure: ANORECTAL EXAM UNDER ANESTHESIA;  Surgeon: Karie SodaGross, Steven, MD;  Location: [facility];  Service: General;  Laterality: N/A;   Medications: Reviewed & Updated - see associated section                       Current Outpatient Medications:  .  acetaminophen (TYLENOL) 325 MG tablet, Take 325-650 mg by mouth every 6 (six) hours as needed for moderate pain or headache. , Disp: , Rfl:  .  AMBULATORY NON FORMULARY MEDICATION, Place 1 application rectally 4 (four) times daily. DILTIAZEM 2% compounded suspension.  (Get Rx filled at a  compounding pharmacy, such as [facility] [phone] or [facility] [phone]  in [place], [place]) (Patient not taking: Reported on 07/25/2018), Disp: 1 Tube, Rfl: 2 .  ibuprofen (ADVIL,MOTRIN) 600 MG tablet, Take 1 tablet (600 mg total) by mouth every 6 (six) hours., Disp: 30 tablet, Rfl: 0 .  medroxyPROGESTERone (DEPO-PROVERA) 150 MG/ML injection, Inject 1 mL (150 mg total) into the muscle every 3 (three) months., Disp: 1 mL, Rfl: 3 .  oxyCODONE (OXY IR/ROXICODONE) 5 MG immediate release tablet, Take 1-2 tablets (5-10 mg total) by mouth every 6 (six) hours as needed for moderate pain, severe pain or breakthrough pain. (Patient not taking: Reported on 07/25/2018), Disp: 40 tablet, Rfl: 0 .  Prenatal Vit-Fe Fumarate-FA (PRENATAL PO), Take 1 tablet by mouth daily., Disp: , Rfl:  No current facility-administered medications for this visit.   Facility-Administered Medications Ordered in Other Visits:  .  lidocaine 2 % w/epi 1:200,000 (20 ml) with sod bicarb 8.4 % (2 ml) inj, , , Continuous PRN, Orlie PollenMerritt, Debra R, CRNA, 5 mL at 08/18/15 2327   Social History: Reviewed -  reports that she has never smoked. She has never used smokeless tobacco.  Objective Findings:  Vitals: Blood pressure 110/65, pulse 72, height 5' 6.5" (1.689 m), weight 167 lb 3.2 oz (75.8 kg), currently breastfeeding.  PHYSICAL EXAMINATION General appearance - alert, well appearing, and in no distress and oriented to person, place, and time Mental status - normal mood, behavior, speech, dress, motor activity, and thought processes, affect appropriate to mood  PELVIC External genitalia - Normal Vagina - healthy white appearance, excess secretions Cervix - normal in appereance  Wet [place] - normal epithelial neg clue, trich. yeast KOH-neg whiff GC/CHL COLLECTED   Assessment & Plan:   A:  1.  STI screening,  2. with normal secretions  P:  1. PRN 2. Will offer Mycolog as precaution for irritation,  even with a negative testing, 3.  Refill prenatal vitamins per patient request   By signing my name below, I, Arnette NorrisMari Johnson, attest that this documentation has been prepared under the direction and in the presence of Tilda BurrowFerguson, Lyliana Dicenso V, MD. Electronically Signed: Arnette NorrisMari Johnson Medical Scribe. 10/24/18. 2:25 PM.  I personally performed the services described in this documentation, which was SCRIBED in my presence. The recorded information has been reviewed and considered accurate. It has been edited as necessary during review. Tilda BurrowJohn V Kaitlynne Wenz, MD

## 2018-10-24 NOTE — Progress Notes (Signed)
Depo Provera 150 mg given IM in left deltoid.  

## 2018-10-27 LAB — GC/CHLAMYDIA PROBE AMP
Chlamydia trachomatis, NAA: NEGATIVE
NEISSERIA GONORRHOEAE BY PCR: NEGATIVE

## 2019-01-16 ENCOUNTER — Ambulatory Visit: Payer: Self-pay

## 2019-01-16 ENCOUNTER — Other Ambulatory Visit: Payer: Self-pay | Admitting: Women's Health

## 2019-01-16 ENCOUNTER — Ambulatory Visit (INDEPENDENT_AMBULATORY_CARE_PROVIDER_SITE_OTHER): Payer: BC Managed Care – PPO | Admitting: *Deleted

## 2019-01-16 ENCOUNTER — Other Ambulatory Visit: Payer: Self-pay

## 2019-01-16 VITALS — Wt 168.2 lb

## 2019-01-16 DIAGNOSIS — Z3042 Encounter for surveillance of injectable contraceptive: Secondary | ICD-10-CM | POA: Diagnosis not present

## 2019-01-16 DIAGNOSIS — Z3202 Encounter for pregnancy test, result negative: Secondary | ICD-10-CM | POA: Diagnosis not present

## 2019-01-16 LAB — POCT URINE PREGNANCY: Preg Test, Ur: NEGATIVE

## 2019-01-16 MED ORDER — MEDROXYPROGESTERONE ACETATE 150 MG/ML IM SUSP
150.0000 mg | Freq: Once | INTRAMUSCULAR | Status: AC
  Administered 2019-01-16: 150 mg via INTRAMUSCULAR

## 2019-01-16 NOTE — Progress Notes (Signed)
Depo Provera 150 mg given IM in right deltoid.

## 2019-04-10 ENCOUNTER — Ambulatory Visit (INDEPENDENT_AMBULATORY_CARE_PROVIDER_SITE_OTHER): Payer: BC Managed Care – PPO | Admitting: *Deleted

## 2019-04-10 ENCOUNTER — Ambulatory Visit: Payer: BC Managed Care – PPO

## 2019-04-10 ENCOUNTER — Other Ambulatory Visit: Payer: Self-pay

## 2019-04-10 DIAGNOSIS — Z3042 Encounter for surveillance of injectable contraceptive: Secondary | ICD-10-CM

## 2019-04-10 MED ORDER — MEDROXYPROGESTERONE ACETATE 150 MG/ML IM SUSP
150.0000 mg | Freq: Once | INTRAMUSCULAR | Status: AC
  Administered 2019-04-10: 150 mg via INTRAMUSCULAR

## 2019-04-10 NOTE — Progress Notes (Signed)
Depo Provera 150mg  Im given in left deltoid with no complications. Pt to return in 12 weeks for next injection.

## 2019-05-27 IMAGING — US US PELVIS COMPLETE
1 series · 14 of 25 positions shown · non-contrast
Comparison: None

CLINICAL DATA: Pelvic pain since end of pregnancy 5 weeks ago.

EXAM:
TRANSABDOMINAL AND TRANSVAGINAL ULTRASOUND OF PELVIS
TECHNIQUE: Both transabdominal and transvaginal ultrasound examinations of the
pelvis were performed. Transabdominal technique was performed for
global imaging of the pelvis including uterus, ovaries, adnexal
regions, and pelvic cul-de-sac. It was necessary to proceed with
endovaginal exam following the transabdominal exam to visualize the
endometrium and ovaries.

[Series 1: us pelvis complete · 0.22mm/px · 14 of 55 slices shown]
[im 1/55]
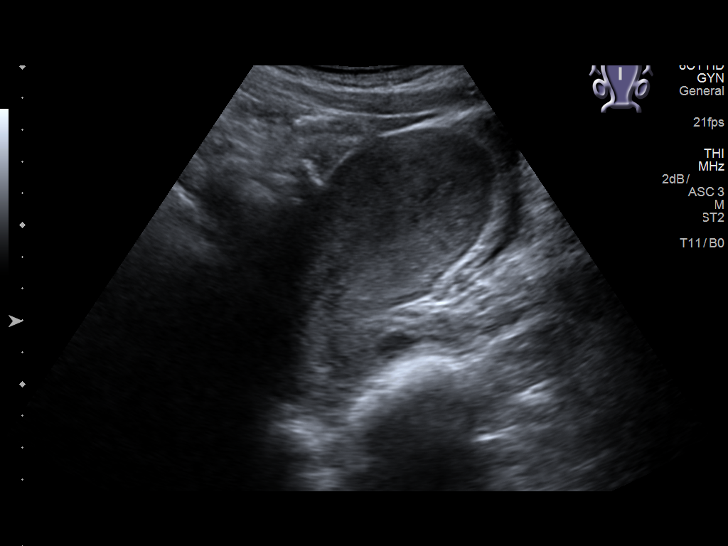
[im 5/55]
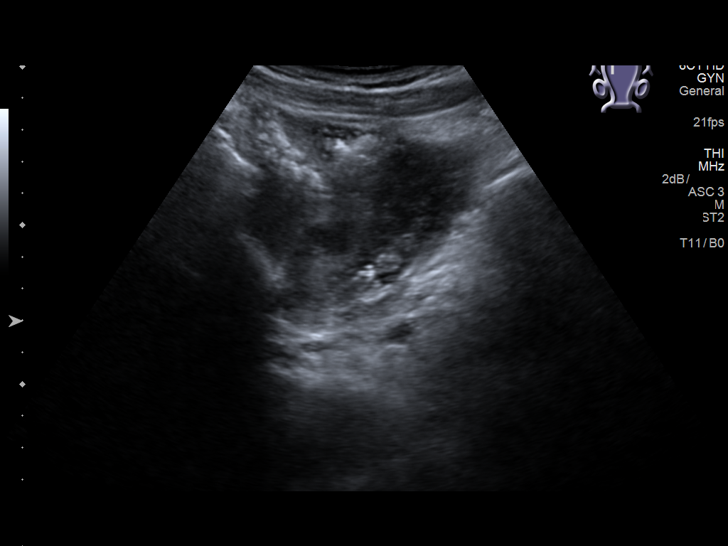
[im 10/55]
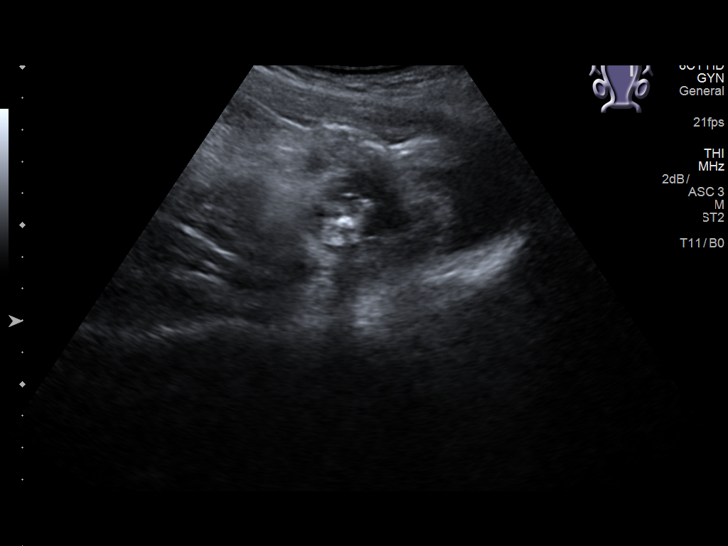
[im 14/55]
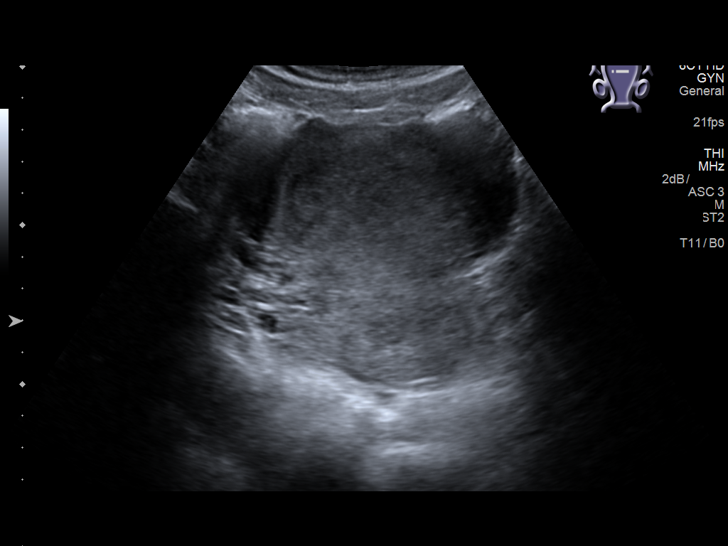
[im 19/55]
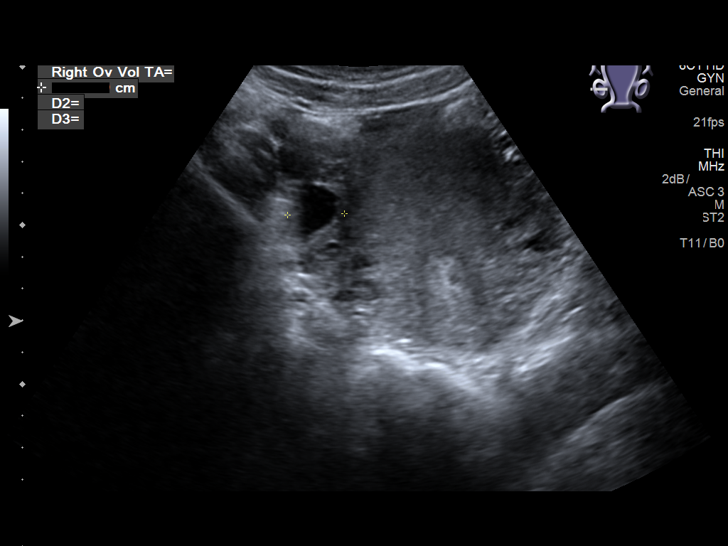
[im 21/55]
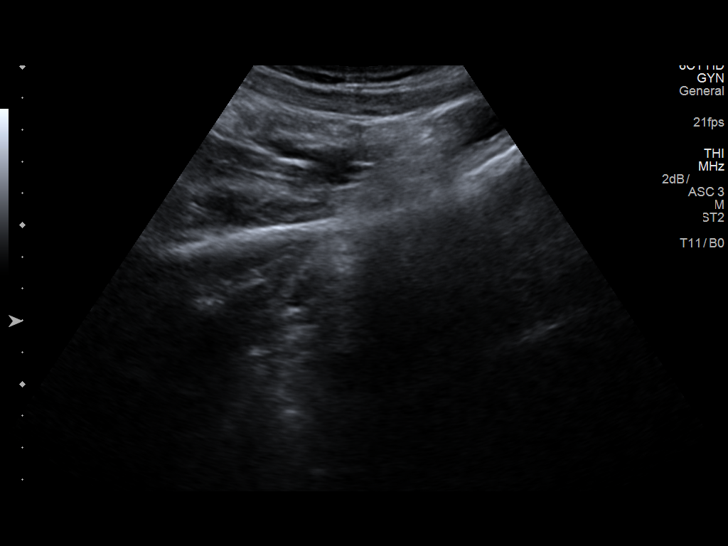
[im 25/55]
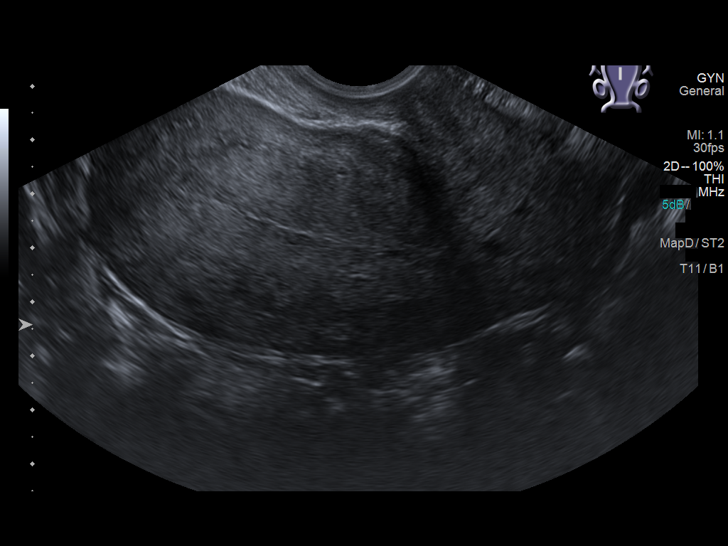
[im 30/55]
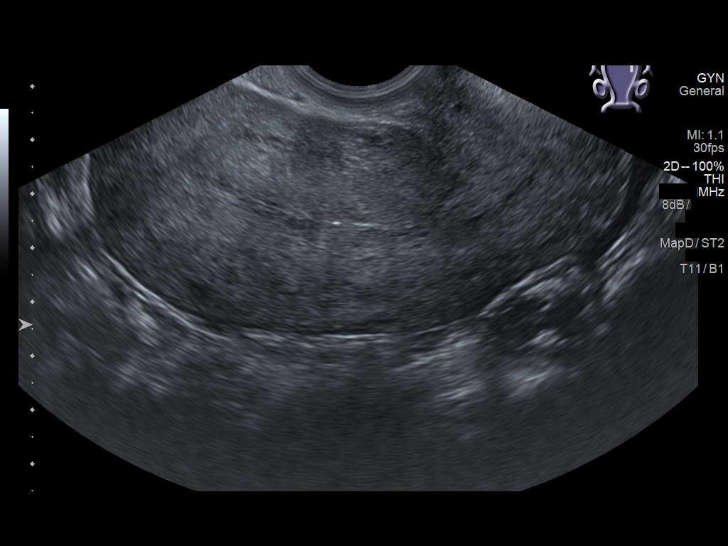
[im 34/55]
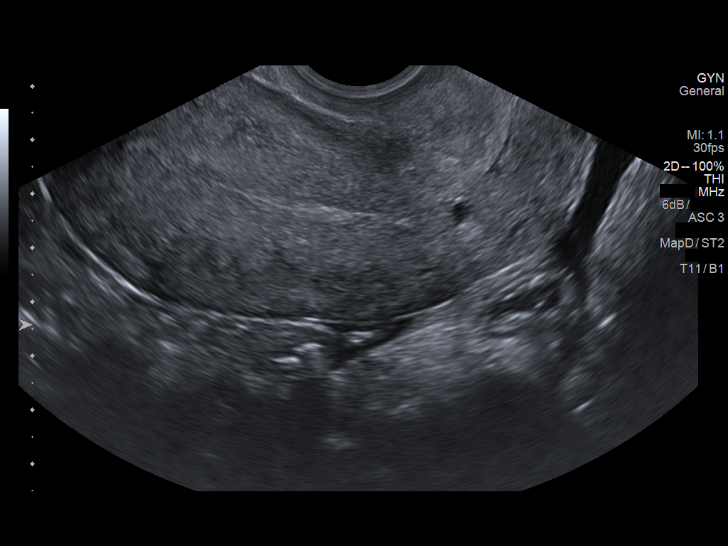
[im 37/55]
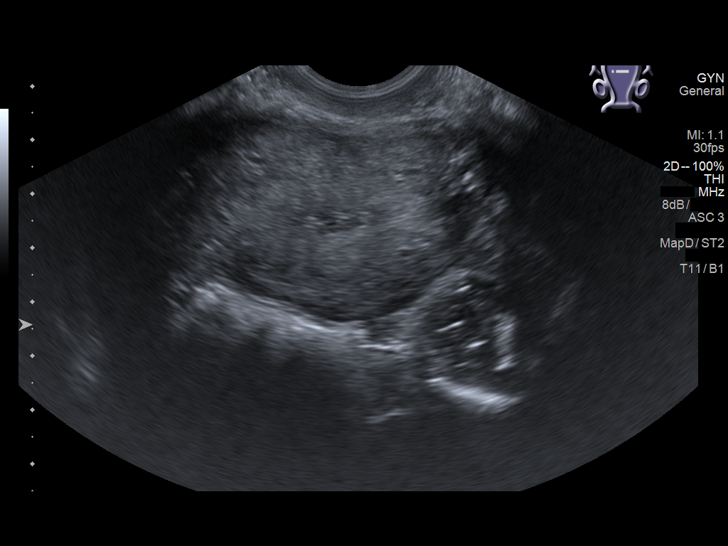
[im 41/55]
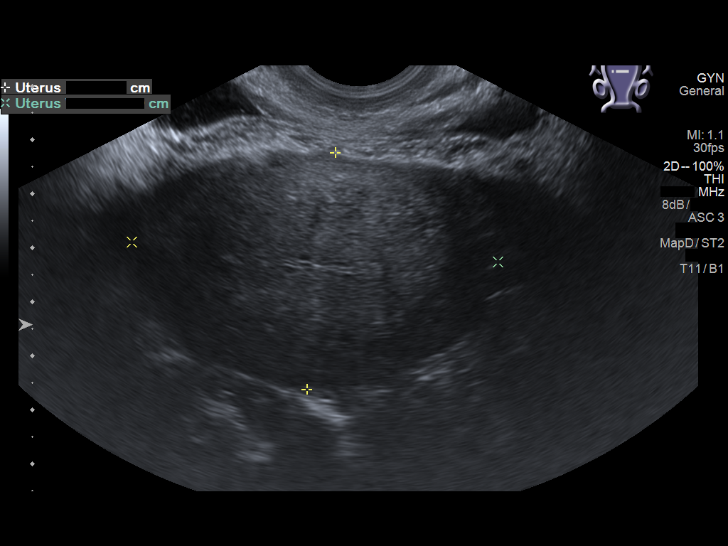
[im 46/55]
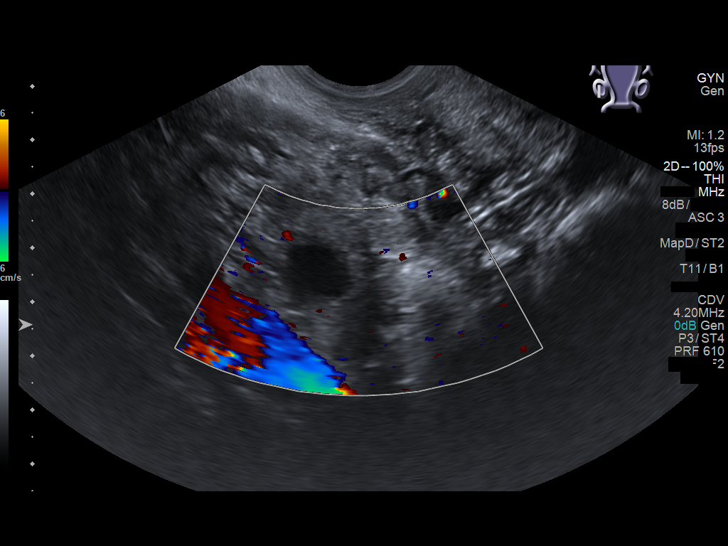
[im 50/55]
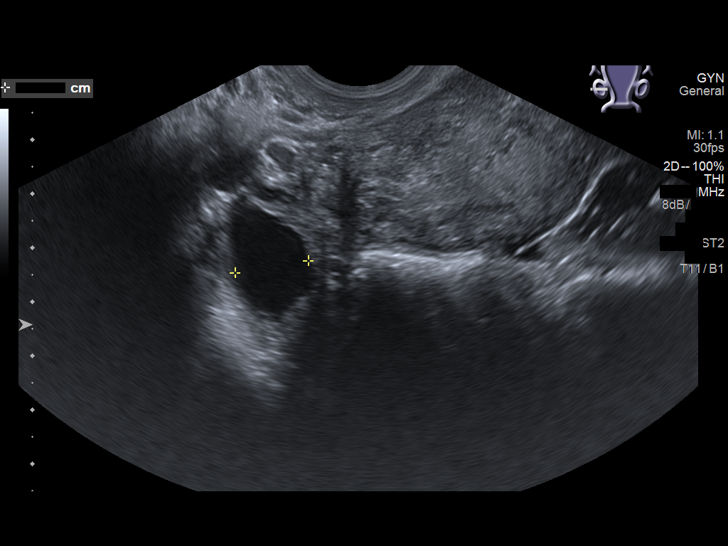
[im 55/55]
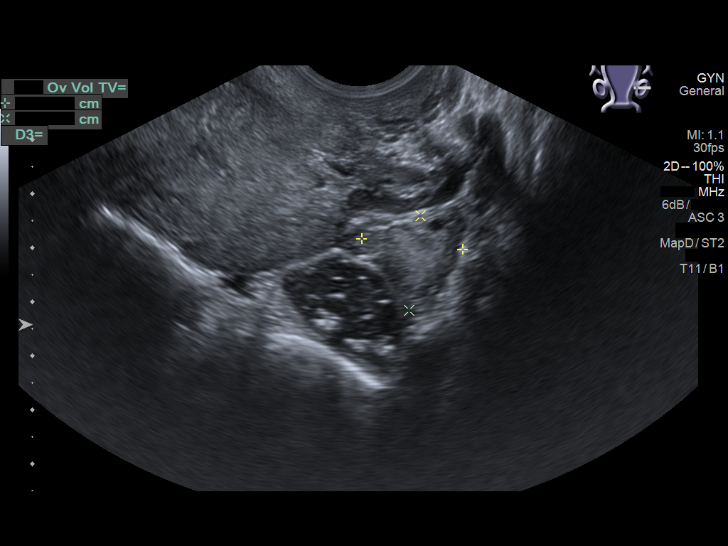

[14 of 25 positions shown; findings below may reference images not displayed]

FINDINGS: Uterus

Measurements: 5.0 x 7.6 x 10.1 cm. No fibroids or other mass
visualized. Small nabothian cyst is present.

Endometrium

Thickness: 3 mm.  No focal abnormality visualized.

Right ovary

Measurements: 3.1 x 2.0 x 4.4 cm. Normal appearance/no adnexal mass.
2.5 cm dominant follicle present. Normal color flow.

Left ovary

Measurements: 1.8 x 1.8 x 2.7 cm. Normal appearance/no adnexal mass.
Normal color flow.

Other findings

Trace amount of free pelvic fluid.
IMPRESSION: Normal pelvic ultrasound.

## 2019-07-03 ENCOUNTER — Ambulatory Visit: Payer: BC Managed Care – PPO

## 2019-07-03 ENCOUNTER — Other Ambulatory Visit: Payer: Self-pay

## 2019-07-03 ENCOUNTER — Ambulatory Visit (INDEPENDENT_AMBULATORY_CARE_PROVIDER_SITE_OTHER): Payer: BC Managed Care – PPO | Admitting: *Deleted

## 2019-07-03 ENCOUNTER — Encounter: Payer: Self-pay | Admitting: *Deleted

## 2019-07-03 DIAGNOSIS — Z308 Encounter for other contraceptive management: Secondary | ICD-10-CM

## 2019-07-03 DIAGNOSIS — Z3202 Encounter for pregnancy test, result negative: Secondary | ICD-10-CM

## 2019-07-03 DIAGNOSIS — Z3042 Encounter for surveillance of injectable contraceptive: Secondary | ICD-10-CM | POA: Diagnosis not present

## 2019-07-03 LAB — POCT URINE PREGNANCY: Preg Test, Ur: NEGATIVE

## 2019-07-03 MED ORDER — MEDROXYPROGESTERONE ACETATE 150 MG/ML IM SUSP
150.0000 mg | Freq: Once | INTRAMUSCULAR | Status: AC
  Administered 2019-07-03: 150 mg via INTRAMUSCULAR

## 2019-07-03 NOTE — Progress Notes (Signed)
Pt here for Depo. Pt received shot in right deltoid. Pt tolerated shot well. Return in 12 weeks for next shot. Schedule physical before next Depo. Epes

## 2019-07-23 ENCOUNTER — Other Ambulatory Visit: Payer: BC Managed Care – PPO | Admitting: Advanced Practice Midwife

## 2019-07-30 ENCOUNTER — Other Ambulatory Visit: Payer: Self-pay

## 2019-07-30 ENCOUNTER — Encounter: Payer: Self-pay | Admitting: Obstetrics and Gynecology

## 2019-07-30 ENCOUNTER — Ambulatory Visit (INDEPENDENT_AMBULATORY_CARE_PROVIDER_SITE_OTHER): Payer: BC Managed Care – PPO | Admitting: Obstetrics and Gynecology

## 2019-07-30 ENCOUNTER — Other Ambulatory Visit (HOSPITAL_COMMUNITY)
Admission: RE | Admit: 2019-07-30 | Discharge: 2019-07-30 | Disposition: A | Discharge status: Home / Self Care | Payer: BC Managed Care – PPO | Source: Ambulatory Visit | Attending: Obstetrics and Gynecology | Admitting: Obstetrics and Gynecology

## 2019-07-30 VITALS — BP 118/57 | HR 69 | Ht 66.5 in | Wt 170.8 lb

## 2019-07-30 DIAGNOSIS — Z01419 Encounter for gynecological examination (general) (routine) without abnormal findings: Secondary | ICD-10-CM | POA: Insufficient documentation

## 2019-07-30 DIAGNOSIS — R300 Dysuria: Secondary | ICD-10-CM | POA: Diagnosis not present

## 2019-07-30 LAB — POCT URINALYSIS DIPSTICK OB
Glucose, UA: NEGATIVE
Leukocytes, UA: NEGATIVE
Nitrite, UA: POSITIVE
POC,PROTEIN,UA: NEGATIVE

## 2019-07-30 MED ORDER — NITROFURANTOIN MONOHYD MACRO 100 MG PO CAPS: 100.0000 mg | ORAL_CAPSULE | Freq: Two times a day (BID) | ORAL | 0 refills | Status: DC

## 2019-07-30 NOTE — Progress Notes (Signed)
Patient ID: Gail Fields, female   DOB: [birth year], 33 y.o.   MRN: [id number]   Assessment:  Annual Gyn Exam Rectal hemorrhoid skin tags with lite bleeding, s/p partial hemorrhoidectomy UTI Plan:  1. pap smear done, next pap due 3 years 2. Referral to Dr. Darrick Penna for considering  hemorrhoid banding 3. C&S r/o UTI  Rx Macrobid. 3    Annual mammogram advised after age 61 Subjective:  Gail Fields is a 33 y.o. female [phone] who presents for annual exam. No LMP recorded. Patient has had an injection. The patient has complaints today of had hemorrhoid w/ Dr.Gross in [place] but still feels pain on outer part and doesn't feel surgery did well for her. Takes miralax every single day, if she doesn't feels "as if fist is coming out" describes stool at very thin diameter.. 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 Unaware of any fhx of breast cancer. Would like referral to Dr. Darrick Penna. Pt very impatient with results, and not interested in return to prior provider. Pt called back 5 times within the 1.5  hour after visit before lunch to rush Rx to pharmacy Last PAP normal with +HRHPV, didn't know she had HRHPV.   The following portions of the patient's history were reviewed and updated as appropriate: allergies, current medications, past family history, past medical history, past social history, past surgical history and problem list. Past Medical History:  Diagnosis Date  . Acute cystitis   . BV (bacterial vaginosis)   . Hemorrhoid   . Hx of chlamydia infection   . Patellofemoral stress syndrome   . 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 Trichimoniasis   . Vaginal Pap smear, abnormal 09/22/14   ASCUS- +HPV  . Yeast infection     Past Surgical History:  Procedure Laterality Date  . HEMORRHOID SURGERY N/A 04/24/2018   Procedure: HEMORRHOIDECTOMY;  Surgeon: Gail Soda, MD;  Location: [facility];  Service: General;  Laterality: N/A;  .  NO PAST SURGERIES    . PEXY N/A 04/24/2018   Procedure: HEMORRHOIDAL LIGATION/PEXY;  Surgeon: Gail Soda, MD;  Location: [facility];  Service: General;  Laterality: N/A;  . RECTAL EXAM UNDER ANESTHESIA N/A 04/24/2018   Procedure: ANORECTAL EXAM UNDER ANESTHESIA;  Surgeon: Gail Soda, MD;  Location: [facility];  Service: General;  Laterality: N/A;     Current Outpatient Medications:  .  medroxyPROGESTERone (DEPO-PROVERA) 150 MG/ML injection, INJECT 1 ML IN THE MUSCLE EVERY 3 MONTHS, Disp: 1 mL, Rfl: 3 No current facility-administered medications for this visit.   Facility-Administered Medications Ordered in Other Visits:  .  lidocaine 2 % w/epi 1:200,000 (20 ml) with sod bicarb 8.4 % (2 ml) inj, , , Continuous PRN, Lorn Junes, Debra R, CRNA, 5 mL at 08/18/15 2327  Review of Systems Constitutional: negative Gastrointestinal: negative Genitourinary: normal  Objective:  BP (!) 118/57 (BP Location: Left Arm, Patient Position: Sitting, Cuff Size: Normal)   Pulse 69   Ht 5' 6.5" (1.689 m)   Wt 170 lb 12.8 oz (77.5 kg)   Breastfeeding No   BMI 27.15 kg/m    BMI: Body mass index is 27.15 kg/m.  General Appearance: Alert, appropriate appearance for age. No acute distress HEENT: Grossly normal Neck / Thyroid:  Cardiovascular: RRR; normal S1, S2, no murmur Lungs: CTA bilaterally Back: No CVAT Breast Exam: No masses or nodes.No dimpling, nipple retraction or discharge. Gastrointestinal: Soft, non-tender, no masses or organomegaly Pelvic Exam:  VAGINA: normal appearing vagina good support CERVIX: normal appearing cervix without discharge or lesions,  UTERUS:  tiny anterior  RECTAL: tight sphincter, hemorrhoidal skin tags small, on pt left at 3 oclock  Pt tolerated rectal exam poorly, tight sphincter.  PAP: Pap smear done today. Lymphatic Exam: Non-palpable nodes in neck, clavicular, axillary, or inguinal regions  Skin: no rash or abnormalities Neurologic: Normal gait and speech, no tremor   Psychiatric: Alert and oriented, appropriate affect.  Urinalysis:nitrite positive and moderate blood, small ketones  By signing my name below, I, Gail Fields, attest that this documentation has been prepared under the direction and in the presence of Jonnie Kind, MD. Electronically Signed: [place]. 07/30/19. 9:18 AM.  I personally performed the services described in this documentation, which was SCRIBED in my presence. The recorded information has been reviewed and considered accurate. It has been edited as necessary during review. Jonnie Kind, MD

## 2019-08-01 LAB — URINE CULTURE

## 2019-08-04 LAB — CYTOLOGY - PAP
Chlamydia: NEGATIVE
Diagnosis: UNDETERMINED — AB
HPV 16: NEGATIVE
HPV 18 / 45: NEGATIVE
High risk HPV: POSITIVE — AB
Molecular Disclaimer: 56
Molecular Disclaimer: NEGATIVE
Molecular Disclaimer: NEGATIVE
Molecular Disclaimer: NORMAL
Molecular Disclaimer: NORMAL
Neisseria Gonorrhea: NEGATIVE

## 2019-08-26 ENCOUNTER — Telehealth: Payer: Self-pay | Admitting: *Deleted

## 2019-08-26 ENCOUNTER — Other Ambulatory Visit: Payer: Self-pay

## 2019-08-26 ENCOUNTER — Ambulatory Visit: Payer: BC Managed Care – PPO | Attending: Surgery | Admitting: Physical Therapy

## 2019-08-26 ENCOUNTER — Encounter: Payer: Self-pay | Admitting: Physical Therapy

## 2019-08-26 DIAGNOSIS — R252 Cramp and spasm: Secondary | ICD-10-CM | POA: Insufficient documentation

## 2019-08-26 DIAGNOSIS — R279 Unspecified lack of coordination: Secondary | ICD-10-CM | POA: Insufficient documentation

## 2019-08-26 DIAGNOSIS — K649 Unspecified hemorrhoids: Secondary | ICD-10-CM

## 2019-08-26 NOTE — Telephone Encounter (Signed)
Pt left message that she was supposed to referred to GI but they said they didn't get a referral. Also states that she needs a followup visit for her abnormal pap.

## 2019-08-26 NOTE — Patient Instructions (Signed)
Toileting Techniques for Bowel Movements (Defecation) Using your belly (abdomen) and pelvic floor muscles to have a bowel movement is usually instinctive.  Sometimes people can have problems with these muscles and have to relearn proper defecation (emptying) techniques.  If you have weakness in your muscles, organs that are falling out, decreased sensation in your pelvis, or ignore your urge to go, you may find yourself straining to have a bowel movement.  You are straining if you are: . holding your breath or taking in a huge gulp of air and holding it  . keeping your lips and jaw tensed and closed tightly . turning red in the face because of excessive pushing or forcing . developing or worsening your  hemorrhoids . getting faint while pushing . not emptying completely and have to defecate many times a day  If you are straining, you are actually making it harder for yourself to have a bowel movement.  Many people find they are pulling up with the pelvic floor muscles and closing off instead of opening the anus. Due to lack pelvic floor relaxation and coordination the abdominal muscles, one has to work harder to push the feces out.  Many people have never been taught how to defecate efficiently and effectively.  Notice what happens to your body when you are having a bowel movement.  While you are sitting on the toilet pay attention to the following areas: . Jaw and mouth position . Angle of your hips   . Whether your feet touch the ground or not . Arm placement  . Spine position . Waist . Belly tension . Anus (opening of the anal canal)  An Evacuation/Defecation Plan   Here are the 4 basic points:  1. Lean forward enough for your elbows to rest on your knees 2. Support your feet on the floor or use a low stool if your feet don't touch the floor  3. Push out your belly as if you have swallowed a beach ball-you should feel a widening of your waist 4. Open and relax your pelvic floor muscles,  rather than tightening around the anus      The following conditions my require modifications to your toileting posture:  . If you have had surgery in the past that limits your back, hip, pelvic, knee or ankle flexibility . Constipation   Your healthcare practitioner may make the following additional suggestions and adjustments:  1) Sit on the toilet  a) Make sure your feet are supported. b) Notice your hip angle and spine position-most people find it effective to lean forward or raise their knees, which can help the muscles around the anus to relax  c) When you lean forward, place your forearms on your thighs for support  2) Relax suggestions a) Breath deeply in through your nose and out slowly through your mouth as if you are smelling the flowers and blowing out the candles. b) To become aware of how to relax your muscles, contracting and releasing muscles can be helpful.  Pull your pelvic floor muscles in tightly by using the image of holding back gas, or closing around the anus (visualize making a circle smaller) and lifting the anus up and in.  Then release the muscles and your anus should drop down and feel open. Repeat 5 times ending with the feeling of relaxation. c) Keep your pelvic floor muscles relaxed; let your belly bulge out. d) The digestive tract starts at the mouth and ends at the anal opening, so be   sure to relax both ends of the tube.  Place your tongue on the roof of your mouth with your teeth separated.  This helps relax your mouth and will help to relax the anus at the same time.  3) Empty (defecation) a) Keep your pelvic floor and sphincter relaxed, then bulge your anal muscles.  Make the anal opening wide.  b) Stick your belly out as if you have swallowed a beach ball. c) Make your belly wall hard using your belly muscles while continuing to breathe. Doing this makes it easier to open your anus. d) Breath out and give a grunt (or try using other sounds such as  ahhhh, shhhhh, ohhhh or grrrrrrr).  4) Finish a) As you finish your bowel movement, pull the pelvic floor muscles up and in.  This will leave your anus in the proper place rather than remaining pushed out and down. If you leave your anus pushed out and down, it will start to feel as though that is normal and give you incorrect signals about needing to have a bowel movement.    [facility] [address] [place], [place] [address]    [email or web address]  Go to store, pelvic pain, dilators  You can check [facility] as well for something similar

## 2019-08-26 NOTE — Telephone Encounter (Signed)
Referral placed in epic, Daisy to put in proficient.

## 2019-08-27 ENCOUNTER — Encounter: Payer: Self-pay | Admitting: Internal Medicine

## 2019-08-27 NOTE — Therapy (Addendum)
[facility] [address], [place], [place], [address] Phone: [phone]   Fax:  [phone]  Physical Therapy Evaluation  Patient Details  Name: Gail Fields MRN: [id number] Date of Birth: 02/26/1986 Referring Provider (PT): Michael Boston, MD   Encounter Date: 08/26/2019  PT End of Session - 09/03/19 1537    Visit Number  1    Date for PT Re-Evaluation  10/21/19    PT Start Time  6063    PT Stop Time  1705    PT Time Calculation (min)  48 min    Activity Tolerance  Patient tolerated treatment well    Behavior During Therapy  [facility] for tasks assessed/performed       Past Medical History:  Diagnosis Date  . Acute cystitis   . BV (bacterial vaginosis)   . Hemorrhoid   . Hx of chlamydia infection   . Patellofemoral stress syndrome   . 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 Trichimoniasis   . Vaginal Pap smear, abnormal 09/22/14   ASCUS- +HPV  . Yeast infection     Past Surgical History:  Procedure Laterality Date  . HEMORRHOID SURGERY N/A 04/24/2018   Procedure: HEMORRHOIDECTOMY;  Surgeon: Michael Boston, MD;  Location: [facility];  Service: General;  Laterality: N/A;  . NO PAST SURGERIES    . PEXY N/A 04/24/2018   Procedure: HEMORRHOIDAL LIGATION/PEXY;  Surgeon: Michael Boston, MD;  Location: [facility];  Service: General;  Laterality: N/A;  . RECTAL EXAM UNDER ANESTHESIA N/A 04/24/2018   Procedure: ANORECTAL EXAM UNDER ANESTHESIA;  Surgeon: Michael Boston, MD;  Location: [facility];  Service: General;  Laterality: N/A;    There were no vitals filed for this visit.   Subjective Assessment - 08/26/19 1624    Subjective  Pt reports BM are the size of her pinky and anythingn more formed is very painful.  Pain is mostly during BM. Having BM 2x/ day    Limitations  Other (comment)    Currently in Pain?  Yes    Pain Score  5     Pain Location  Rectum    Pain Orientation  Mid    Pain Descriptors / Indicators  Tightness    Pain  Type  Chronic pain    Aggravating Factors   just having a BM    Multiple Pain Sites  No         [facility] PT Assessment - 09/03/19 0001      Assessment   Medical Diagnosis  K59.4 (ICD-10-CM) - Anal spasm    Referring Provider (PT)  Michael Boston, MD    Onset Date/Surgical Date  --   one year   Prior Therapy  No      Precautions   Precautions  None      Restrictions   Weight Bearing Restrictions  No      Balance Screen   Has the patient fallen in the past 6 months  No      [place] residence    Living Arrangements  Children   2 children     Prior Function   Level of Independence  Independent    Vocation  Full time employment      Cognition   Overall Cognitive Status  Within Functional Limits for tasks assessed      PROM   Overall PROM Comments  bilat hip ER 20% limited      Strength  Overall Strength Comments  core strength 4+/5 ; lengthened abdominal wall anterior pelvic tilt      Flexibility   Soft Tissue Assessment /Muscle Length  yes    Hamstrings  Lt 20% limited      Palpation   Palpation comment  lumbar and gluteals tight                Objective measurements completed on examination: See above findings.    Pelvic Floor Special Questions - 09/03/19 0001    Prior Pelvic/Prostate Exam  Yes    Are you Pregnant or attempting pregnancy?  No    Number of Pregnancies  2   4 and 21 months   Number of Vaginal Deliveries  2    Any difficulty with labor and deliveries  Yes   foreceps or suction x1   Currently Sexually Active  Yes    Is this Painful  No    Urinary Leakage  No    Urinary urgency  No    Urinary frequency  normal    Fecal incontinence  No    Falling out feeling (prolapse)  No    Skin Integrity  Intact    Prolapse  None    Pelvic Floor Internal Exam  pt identity confirmed and informed consent given to perform internal soft tissue assessment and treatment as needed    Exam Type  Rectal     Palpation  external anal sphincter TTP and tight, internal sphincter unable to bypass without severe pain    Strength  weak squeeze, no lift    Strength # of reps  1    Strength # of seconds  2    Tone  high       [facility] Adult PT Treatment/Exercise - 09/03/19 0001      Ambulation/Gait   Gait Pattern  Within Functional Limits      Posture/Postural Control   Posture/Postural Control  Postural limitations    Postural Limitations  Decreased lumbar lordosis      Self-Care   Self-Care  Other Self-Care Comments    Other Self-Care Comments   see HEP for details on education and demonstrations provided               PT Short Term Goals - 09/03/19 1518      PT SHORT TERM GOAL #1   Title  report 25% less pain    Time  4    Period  Weeks    Status  New    Target Date  09/23/19        PT Long Term Goals - 09/03/19 1241      PT LONG TERM GOAL #1   Title  Pt will be able to have bowel movement with at least quarter sized diameter stool.    Time  8    Period  Weeks    Status  New    Target Date  10/21/19      PT LONG TERM GOAL #2   Title  Pt will report 50% less pain with bowel movements    Time  8    Period  Weeks    Status  New    Target Date  10/21/19      PT LONG TERM GOAL #3   Title  Pt will be independant with using  dilators in order to maintain improved soft tissue length of anal sphincter muscles    Time  8    Period  Weeks  Status  New    Target Date  10/21/19      PT LONG TERM GOAL #4   Title  Pt will be ind with HEP    Time  8    Period  Weeks    Status  New    Target Date  10/21/19             Plan - 09/03/19 1518    Clinical Impression Statement  Pt presents to skilled PT due to difficulty and pain with bowel movements.  Pt has core weakness and anterior pelvic tilt.  Left hamstring length and bilateral hip external rotation is limited.  Pt has severely limited anal sphincter length and unable to palpate beyond internal anal sphincter  without severe pain.  Pt has difficulty coordinating pelvic floor muscles to bulge and lengthen . Pt will benefit from skilled PT to address impairments and help her impliment and plan for her to do at home to improve bowel function.    Personal Factors and Comorbidities  Comorbidity 1    Comorbidities  chronic pain    Examination-Activity Limitations  Toileting    Stability/Clinical Decision Making  Stable/Uncomplicated    Clinical Decision Making  Low    Rehab Potential  Excellent    PT Frequency  1x / week    PT Duration  8 weeks    PT Treatment/Interventions  ADLs/Self Care Home Management;Biofeedback;Cryotherapy;Electrical Stimulation;Moist Heat;Neuromuscular re-education;Patient/family education;Therapeutic exercise;Therapeutic activities;Taping;Dry needling;Manual techniques    PT Next Visit Plan  biofeedback and internal STM, f/u on dilators and self massage    PT Home Exercise Plan  initiate stretches    Consulted and Agree with Plan of Care  Patient       Patient will benefit from skilled therapeutic intervention in order to improve the following deficits and impairments:  Pain, Increased fascial restricitons, Decreased strength, Impaired flexibility, Decreased coordination, Increased muscle spasms  Visit Diagnosis: Cramp and spasm  Unspecified lack of coordination     Problem List Patient Active Problem List   Diagnosis Date Noted  . Breast-feeding mother 04/24/2018  . Prolapsed hemorrhoids, grade 4, s/p ligation/pexy/hemorrhoidectomy 04/24/2018 04/24/2018  . 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 Dyspareunia due to medical condition in female 12/06/2016  . 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 Polyp at cervical os 10/13/2014  . Patellofemoral stress syndrome 10/13/2014    Junious SilkJakki L Desenglau, PT 09/03/2019, 3:45 PM  [facility] [address] [place], [place], [address] Phone: [phone]   Fax:  [phone]  Name: Rene KocherLeslie Anne Ohaver MRN: [id number] Date of Birth: 1986-04-12

## 2019-09-03 NOTE — Addendum Note (Signed)
Addended by: Su Hoff on: 09/03/2019 03:47 PM   Modules accepted: Orders

## 2019-09-09 ENCOUNTER — Encounter: Payer: Self-pay | Admitting: Physical Therapy

## 2019-09-09 ENCOUNTER — Ambulatory Visit: Payer: BC Managed Care – PPO | Attending: Surgery | Admitting: Physical Therapy

## 2019-09-09 ENCOUNTER — Other Ambulatory Visit: Payer: Self-pay

## 2019-09-09 ENCOUNTER — Ambulatory Visit: Payer: BC Managed Care – PPO | Admitting: Physical Therapy

## 2019-09-09 DIAGNOSIS — Z3042 Encounter for surveillance of injectable contraceptive: Secondary | ICD-10-CM | POA: Insufficient documentation

## 2019-09-09 DIAGNOSIS — R279 Unspecified lack of coordination: Secondary | ICD-10-CM | POA: Insufficient documentation

## 2019-09-09 DIAGNOSIS — R252 Cramp and spasm: Secondary | ICD-10-CM | POA: Diagnosis not present

## 2019-09-09 NOTE — Patient Instructions (Signed)
Access Code: [email or web address]  URL: [email or web address]  Date: 09/09/2019  Prepared by: Jari Favre   Exercises  Seated Figure 4 Piriformis Stretch - 10 reps - 3 sets - 1x daily - 7x weekly  Supine Diaphragmatic Breathing with Pelvic Floor Lengthening - 10 reps - 1 sets - 3x daily - 7x weekly  Supine Pelvic Floor Stretch - 3 reps - 1 sets - 30 sec hold - 1x daily - 7x weekly  Cat-Camel to Child's Pose - 5 reps - 1 sets - 10 sec hold - 1x daily - 7x weekly  Child's Pose with Sidebending - 3 reps - 1 sets - 30 sec hold - 1x daily - 7x weekly  Child's Pose with Thread the Needle - 10 reps - 3 sets - 1x daily - 7x weekly  Supine Lower Trunk Rotation - 10 reps - 1 sets - 5 sec hold - 1x daily - 7x weekly

## 2019-09-09 NOTE — Progress Notes (Addendum)
REVIEWED-NO ADDITIONAL RECOMMENDATIONS.  Referring Provider: Tilda Burrow, MD Primary Care Physician:  Lazaro Arms, MD Primary Gastroenterologist:  Dr. Darrick Penna  Chief Complaint  Patient presents with   Hemorrhoids    HPI:   Gail Fields is a 33 y.o. female presenting today at the request of Tilda Burrow, MD for hemorrhoids.  Patient underwent hemorrhoidectomy on 04/24/2018 by Dr. Karie Soda at [facility].  OR findings with Left anterolateral > right posterior lateral grade 4 internal/external hemorrhoids.  Ligated and excised.  Right anterior grade 3 treated with ligation and pexy. Ultimately, at the end of the surgery, all 6 anorectal columns were ligated and pexied in the classic hexagonal fashion (right anterior/lateral/posterior, left anterior/lateral/posterior).   Reviewed recent OB/GYN note on 07/30/2019 with patient complaining of pain on the outside of her rectum and did not feel surgery went well for her.  Reported taking MiraLAX daily, if not she feels "as if fist is coming out." Described stool as very thin in diameter.  She was referred to GI for further evaluation.  Today she reports her anal sphinceter is very tight. Present since surgery. Most of the time stools are mushy. She is taking MiraLAX daily, sometimes BID. If she is passing any formed stool that is even pinky size, she will have sharp pain. Burning with BM since surgery. Has to wipe and wipe. When passing gas has to get in a certain position because it "doesn't just flow out." Was prescribed diltiazem in the past. QID x 30 days. Has used this several times since surgery. This did not help. Only relieves burning. Just completed this most recently 1 month ago. Engineer, civil (consulting) referred her to physical therapy for "tight anal sphincter."  Reports physical therapy told her they didn't know how much it was going to help. They recommended anal dilators and different stretches. Not currently using anything for  her hemorrhoids. Also has shooting rectal pain at times. A couple times a week.   Occasional toilet tissue hematochezia. Occurs mostly when she has a formed stool but also with mushy stools. Couple times a week. Present prior to surgery. No blood in the stool or toilet water. No black stools. No abdominal pain. No nausea or vomiting. Occasional reflux symptoms. Usually when she eats tacos or pizza. Taking TUMS as need. Occurred yesterday after a smoothie. Was worse when she was pregnant. She was on something at that time. No dysphagia. No unintentional weight loss.   No NSAIDs.   Past Medical History:  Diagnosis Date   Acute cystitis    BV (bacterial vaginosis)    Hemorrhoid    Hx of chlamydia infection    Patellofemoral stress syndrome    Polyp at cervical os    Trichimoniasis    Vaginal Pap smear, abnormal 09/22/14   ASCUS- +HPV   Yeast infection     Past Surgical History:  Procedure Laterality Date   HEMORRHOID SURGERY N/A 04/24/2018   Procedure: HEMORRHOIDECTOMY;  Surgeon: Karie Soda, MD;  Location: [facility];  Service: General;  Laterality: N/A;   PEXY N/A 04/24/2018   Procedure: HEMORRHOIDAL LIGATION/PEXY;  Surgeon: Karie Soda, MD;  Location: [facility];  Service: General;  Laterality: N/A;   RECTAL EXAM UNDER ANESTHESIA N/A 04/24/2018   Procedure: ANORECTAL EXAM UNDER ANESTHESIA;  Surgeon: Karie Soda, MD;  Location: [facility];  Service: General;  Laterality: N/A;    Current Outpatient Medications  Medication Sig Dispense Refill   medroxyPROGESTERone (DEPO-PROVERA) 150 MG/ML injection INJECT 1 ML  IN THE MUSCLE EVERY 3 MONTHS 1 mL 3   polyethylene glycol (MIRALAX / GLYCOLAX) 17 g packet Take 17 g by mouth daily.     polyethylene glycol-electrolytes (NULYTELY/GOLYTELY) 420 g solution As directed 4000 mL 0   No current facility-administered medications for this visit.    Facility-Administered Medications Ordered in Other Visits  Medication Dose Route Frequency  Provider Last Rate Last Dose   lidocaine 2 % w/epi 1:200,000 (20 ml) with sod bicarb 8.4 % (2 ml) inj    Continuous PRN Gilmer Mor R, CRNA   5 mL at 08/18/15 2327    Allergies as of 09/10/2019   (No Known Allergies)    Family History  Problem Relation Age of Onset   Cancer Maternal Grandmother        breast   Seizures Paternal Grandmother    Colon cancer Neg Hx     Social History   Socioeconomic History   Marital status: Single    Spouse name: Not on file   Number of children: Not on file   Years of education: Not on file   Highest education level: Not on file  Occupational History   Not on file  Social Needs   Financial resource strain: Not on file   Food insecurity    Worry: Not on file    Inability: Not on file   Transportation needs    Medical: Not on file    Non-medical: Not on file  Tobacco Use   Smoking status: Never Smoker   Smokeless tobacco: Never Used  Substance and Sexual Activity   Alcohol use: No   Drug use: No   Sexual activity: Yes    Birth control/protection: Injection  Lifestyle   Physical activity    Days per week: Not on file    Minutes per session: Not on file   Stress: Not on file  Relationships   Social connections    Talks on phone: Not on file    Gets together: Not on file    Attends religious service: Not on file    Active member of club or organization: Not on file    Attends meetings of clubs or organizations: Not on file    Relationship status: Not on file   Intimate partner violence    Fear of current or ex partner: Not on file    Emotionally abused: Not on file    Physically abused: Not on file    Forced sexual activity: Not on file  Other Topics Concern   Not on file  Social History Narrative   Not on file    Review of Systems: Gen: Denies any fever, chills, lightheadedness, dizziness, or feeling like she will pass out. CV: Denies chest pain, heart palpitations Resp: Denies shortness of  breath at rest or cough GI: See HPI GU : Denies urinary burning, urinary frequency, urinary hesitancy MS: Denies joint pain Derm: Denies rash Psych: Denies depression, anxiety Heme: See HPI  Physical Exam: BP 119/66    Pulse 82    Temp (!) 97.1 F (36.2 C) (Temporal)    Ht 5' 6.5" (1.689 m)    Wt 173 lb 3.2 oz (78.6 kg)    BMI 27.54 kg/m  General:   Alert and oriented. Pleasant and cooperative. Well-nourished and well-developed.  Head:  Normocephalic and atraumatic. Eyes:  Without icterus, sclera clear and conjunctiva pink.  Ears:  Normal auditory acuity. Nose:  No deformity, discharge,  or lesions. Lungs:  Clear to auscultation  bilaterally. No wheezes, rales, or rhonchi. No distress.  Heart:  S1, S2 present without murmurs appreciated.  Abdomen:  +BS, soft, non-tender and non-distended. No HSM noted. No guarding or rebound. No masses appreciated.  Rectal:   Nontender external hemorrhoid versus skin tag.  No obvious anal fissure.  Internal exam limited due to increased sphincter tone with some discomfort.  No obvious masses or large internal hemorrhoids palpated.  Msk:  Symmetrical without gross deformities. Normal posture. Extremities:  Without edema. Neurologic:  Alert and  oriented x4;  grossly normal neurologically. Skin:  Intact without significant lesions or rashes. Psych: Normal mood and affect.

## 2019-09-09 NOTE — Therapy (Addendum)
[facility] [address] [place], [place], [address] Phone: [phone]   Fax:  [phone]  Physical Therapy Treatment  Patient Details  Name: Gail Fields MRN: [id number] Date of Birth: 1986-04-04 Referring Provider (PT): Michael Boston, MD   Encounter Date: 09/09/2019  PT End of Session - 09/09/19 1718    Visit Number  2    Date for PT Re-Evaluation  10/21/19    PT Start Time  0938    PT Stop Time  1703    PT Time Calculation (min)  46 min    Activity Tolerance  Patient tolerated treatment well    Behavior During Therapy  [facility] for tasks assessed/performed       Past Medical History:  Diagnosis Date  . Acute cystitis   . BV (bacterial vaginosis)   . Hemorrhoid   . Hx of chlamydia infection   . Patellofemoral stress syndrome   . 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 Trichimoniasis   . Vaginal Pap smear, abnormal 09/22/14   ASCUS- +HPV  . Yeast infection     Past Surgical History:  Procedure Laterality Date  . HEMORRHOID SURGERY N/A 04/24/2018   Procedure: HEMORRHOIDECTOMY;  Surgeon: Michael Boston, MD;  Location: [facility];  Service: General;  Laterality: N/A;  . NO PAST SURGERIES    . PEXY N/A 04/24/2018   Procedure: HEMORRHOIDAL LIGATION/PEXY;  Surgeon: Michael Boston, MD;  Location: [facility];  Service: General;  Laterality: N/A;  . RECTAL EXAM UNDER ANESTHESIA N/A 04/24/2018   Procedure: ANORECTAL EXAM UNDER ANESTHESIA;  Surgeon: Michael Boston, MD;  Location: [facility];  Service: General;  Laterality: N/A;    There were no vitals filed for this visit.  Subjective Assessment - 09/09/19 1623    Subjective  States things are still the same.  There is burning on the skin around the anus. States she does have pain with intercourse in that area as well because of any liquid touching that skin feeling like burning.    Currently in Pain?  No/denies                       [facility] Adult PT Treatment/Exercise -  09/09/19 0001      Neuro Re-ed    Neuro Re-ed Details   breathing with stretches      Exercises   Exercises  Lumbar      Lumbar Exercises: Stretches   Other Lumbar Stretch Exercise  child pose, child pose with side bend, child pose with threading - 3 x 20 sec      Lumbar Exercises: Quadruped   [place] Horse  10 reps   tactile cues to perform correctly     Manual Therapy   Manual Therapy  Myofascial release;Soft tissue mobilization;Internal Pelvic Floor    Manual therapy comments  pt identity confirmed and infomred consent given to perform soft tissue release to pelvic floor vaginally/rectally    Soft tissue mobilization  lumbar paraspinals    Myofascial Release  lumbar     Internal Pelvic Floor  anal sphincter muscles and puborectalis, levators, TP             PT Education - 09/09/19 1621    Education Details  Access Code: [email or web address] URL: [email or web address] Date: 09/09/2019 Prepared by: Jari Favre  Exercises Seated Figure 4 Piriformis Stretch - 10 reps - 3 sets - 1x daily - 7x weekly Supine Diaphragmatic Breathing with Pelvic Floor Lengthening - 10 reps -  1 sets - 3x daily - 7x weekly Supine Pelvic Floor Stretch - 3 reps - 1 sets - 30 sec hold - 1x daily - 7x weekly    Person(s) Educated  Patient    Methods  Explanation;Demonstration;Handout;Verbal cues    Comprehension  Verbalized understanding;Returned demonstration       PT Short Term Goals - 09/03/19 1518      PT SHORT TERM GOAL #1   Title  report 25% less pain    Time  4    Period  Weeks    Status  New    Target Date  09/23/19        PT Long Term Goals - 09/03/19 1241      PT LONG TERM GOAL #1   Title  Pt will be able to have bowel movement with at least quarter sized diameter stool.    Time  8    Period  Weeks    Status  New    Target Date  10/21/19      PT LONG TERM GOAL #2   Title  Pt will report 50% less pain with bowel movements    Time  8    Period  Weeks    Status   New    Target Date  10/21/19      PT LONG TERM GOAL #3   Title  Pt will be independant with using  dilators in order to maintain improved soft tissue length of anal sphincter muscles    Time  8    Period  Weeks    Status  New    Target Date  10/21/19      PT LONG TERM GOAL #4   Title  Pt will be ind with HEP    Time  8    Period  Weeks    Status  New    Target Date  10/21/19            Plan - 09/09/19 1719    Clinical Impression Statement  Pt was able to tolerate STM better today with index finger.  PT performed soft tissue internally vaginally in order to work on transverse peroneus and levators.  Pt tolerated soft tissue well with increased soft tissue length after treatment.  She was given instructions on using ice after BM and heat to relax muscles as well as stretches as shown in HEP.  Continue to work on improved muscle coordination and soft tissue length for bowel function.    PT Treatment/Interventions  ADLs/Self Care Home Management;Biofeedback;Cryotherapy;Electrical Stimulation;Moist Heat;Neuromuscular re-education;Patient/family education;Therapeutic exercise;Therapeutic activities;Taping;Dry needling;Manual techniques    PT Next Visit Plan  biofeedback and internal STM, f/u on dilators and self massage, f/u on stretches added    PT Home Exercise Plan  Access Code: [email or web address]    Consulted and Agree with Plan of Care  Patient       Patient will benefit from skilled therapeutic intervention in order to improve the following deficits and impairments:  Pain, Increased fascial restricitons, Decreased strength, Impaired flexibility, Decreased coordination, Increased muscle spasms  Visit Diagnosis: Cramp and spasm  Unspecified lack of coordination     Problem List Patient Active Problem List   Diagnosis Date Noted  . Breast-feeding mother 04/24/2018  . Prolapsed hemorrhoids, grade 4, s/p ligation/pexy/hemorrhoidectomy 04/24/2018 04/24/2018  . Post concussive syndrome  10/26/2017  . Dyspareunia due to medical condition in female 12/06/2016  . Atypical squamous cell changes of undetermined significance (ASCUS) on cervical cytology with positive high  risk human papilloma virus (HPV) 01/20/2015  . Polyp at cervical os 10/13/2014  . Patellofemoral stress syndrome 10/13/2014    Jule Ser, PT 09/09/2019, 5:31 PM  [facility] [address], [place], [place], [address] Phone: [phone]   Fax:  [phone]  Name: Gail Fields MRN: [id number] Date of Birth: Aug 24, 1986  PHYSICAL THERAPY DISCHARGE SUMMARY  Visits from Start of Care: 2  Current functional level related to goals / functional outcomes: See above details   Remaining deficits: See above details   Education / Equipment: HEP Plan: Patient agrees to discharge.  Patient goals were not met. Patient is being discharged due to not returning since the last visit.  ?????    Only came to one f/u treatment  Gustavus Bryant, PT 01/05/20 9:23 AM

## 2019-09-10 ENCOUNTER — Ambulatory Visit (INDEPENDENT_AMBULATORY_CARE_PROVIDER_SITE_OTHER): Payer: BC Managed Care – PPO | Admitting: Gastroenterology

## 2019-09-10 ENCOUNTER — Other Ambulatory Visit: Payer: Self-pay

## 2019-09-10 ENCOUNTER — Ambulatory Visit: Payer: BC Managed Care – PPO | Admitting: Gastroenterology

## 2019-09-10 ENCOUNTER — Encounter: Payer: Self-pay | Admitting: Gastroenterology

## 2019-09-10 VITALS — BP 119/66 | HR 82 | Temp 97.1°F | Ht 66.5 in | Wt 173.2 lb

## 2019-09-10 DIAGNOSIS — K219 Gastro-esophageal reflux disease without esophagitis: Secondary | ICD-10-CM | POA: Diagnosis not present

## 2019-09-10 DIAGNOSIS — K6289 Other specified diseases of anus and rectum: Secondary | ICD-10-CM | POA: Diagnosis not present

## 2019-09-10 DIAGNOSIS — K625 Hemorrhage of anus and rectum: Secondary | ICD-10-CM

## 2019-09-10 DIAGNOSIS — K649 Unspecified hemorrhoids: Secondary | ICD-10-CM

## 2019-09-10 NOTE — Patient Instructions (Addendum)
Please have labs completed.   We will get you scheduled for a colonoscopy with possible hemorrhoid banding in the near future with Dr. Darrick Penna.   I am calling in [facility] cream compounded with nitroglycerin to help with your hemorrhoids and rectal pain.  You should use this 3 times daily.  Use at least 2 weeks or longer if symptoms have not resolved.  You will need to continue 1 week after symptoms resolve.  Please call me in 2 weeks to let me know how this is working.  Regarding your reflux symptoms, I would like for you to follow a GERD diet.  Avoid fried, greasy, fatty, spicy, and citrus foods.  Avoid caffeine, carbonated beverages, and chocolate.  Do not eat within 3 hours of laying down.  See handout below.  We will see back in the office after your procedure.  Call if you have questions or concerns prior.  Ermalinda Memos, PA-C [facility]    Food Choices for Gastroesophageal Reflux Disease, Adult When you have gastroesophageal reflux disease (GERD), the foods you eat and your eating habits are very important. Choosing the right foods can help ease your discomfort. Think about working with a nutrition specialist (dietitian) to help you make good choices. What are tips for following this plan?  Meals  Choose healthy foods that are low in fat, such as fruits, vegetables, whole grains, low-fat dairy products, and lean meat, fish, and poultry.  Eat small meals often instead of 3 large meals a day. Eat your meals slowly, and in a place where you are relaxed. Avoid bending over or lying down until 2-3 hours after eating.  Avoid eating meals 2-3 hours before bed.  Avoid drinking a lot of liquid with meals.  Cook foods using methods other than frying. Bake, grill, or broil food instead.  Avoid or limit: ? Chocolate. ? Peppermint or spearmint. ? Alcohol. ? Pepper. ? Black and decaffeinated coffee. ? Black and decaffeinated tea. ? Bubbly (carbonated) soft  drinks. ? Caffeinated energy drinks and soft drinks.  Limit high-fat foods such as: ? Fatty meat or fried foods. ? Whole milk, cream, butter, or ice cream. ? Nuts and nut butters. ? Pastries, donuts, and sweets made with butter or shortening.  Avoid foods that cause symptoms. These foods may be different for everyone. Common foods that cause symptoms include: ? Tomatoes. ? Oranges, lemons, and limes. ? Peppers. ? Spicy food. ? Onions and garlic. ? Vinegar. Lifestyle  Maintain a healthy weight. Ask your doctor what weight is healthy for you. If you need to lose weight, work with your doctor to do so safely.  Exercise for at least 30 minutes for 5 or more days each week, or as told by your doctor.  Wear loose-fitting clothes.  Do not smoke. If you need help quitting, ask your doctor.  Sleep with the head of your bed higher than your feet. Use a wedge under the mattress or blocks under the bed frame to raise the head of the bed. Summary  When you have gastroesophageal reflux disease (GERD), food and lifestyle choices are very important in easing your symptoms.  Eat small meals often instead of 3 large meals a day. Eat your meals slowly, and in a place where you are relaxed.  Limit high-fat foods such as fatty meat or fried foods.  Avoid bending over or lying down until 2-3 hours after eating.  Avoid peppermint and spearmint, caffeine, alcohol, and chocolate. This information is not intended to  replace advice given to you by your health care provider. Make sure you discuss any questions you have with your health care provider. Document Released: 04/23/2012 Document Revised: 02/13/2019 Document Reviewed: 11/28/2016 Elsevier Patient Education  2020 [employer].

## 2019-09-11 ENCOUNTER — Telehealth: Payer: Self-pay | Admitting: *Deleted

## 2019-09-11 ENCOUNTER — Other Ambulatory Visit: Payer: Self-pay | Admitting: *Deleted

## 2019-09-11 DIAGNOSIS — K625 Hemorrhage of anus and rectum: Secondary | ICD-10-CM

## 2019-09-11 DIAGNOSIS — K649 Unspecified hemorrhoids: Secondary | ICD-10-CM

## 2019-09-11 MED ORDER — PEG 3350-KCL-NA BICARB-NACL 420 G PO SOLR: ORAL | 0 refills | Status: DC

## 2019-09-11 NOTE — Telephone Encounter (Signed)
Called pt. She is scheduled for TCS +/-hem banding with [facility] 01/02/20 at 11:00am. Patient aware will mail instructions to her. Rx sent to pharmacy. Orders entered.

## 2019-09-14 ENCOUNTER — Encounter: Payer: Self-pay | Admitting: Gastroenterology

## 2019-09-14 DIAGNOSIS — K6289 Other specified diseases of anus and rectum: Secondary | ICD-10-CM | POA: Insufficient documentation

## 2019-09-14 NOTE — Assessment & Plan Note (Addendum)
33 year old female who has history of prolapse grade 3 and grade 4 hemorrhoids who underwent hemorrhoidectomy with hemorrhoidal ligation and pexy and ligation and pexy of all anorectal columns in June 2019 by Dr. Remo Lipps gross.  Prior to surgery patient had intermittent tissue hematochezia which has persisted and occurs a couple times a week.  Since surgery, patient has had trouble with a " tight anal sphincter" with pain passing any formed stools.  When formed stools are passed, these are very thin in caliber.  Also with shooting rectal pain a couple times a week and rectal burning with all BMs since surgery.  She reports using diltiazem 4 times daily x30 days several times since surgery without any improvement.  Surgeon referred her to physical therapy for "tight anal sphincter."  Per patient physical therapy recommended anal dilators and different stretches.  She has not used anal dilators.   Rectal exam with nontender external hemorrhoid versus skin tag.  No obvious anal fissure.  Internal exam limited due to increased sphincter tone with some discomfort.  No obvious masses or large internal hemorrhoids palpated.   Unclear if symptoms are related to hemorrhoids or sequela from her surgery.  With rectal bleeding, patient needs TCS with possible hemorrhoid banding as appropriate with Dr. Oneida Alar in the near future. The risks, benefits, and alternatives have been discussed in detail with patient. They have stated understanding and desire to proceed.  *Patient may need peds scope. Ultimately, patient may need referral back to surgery.  She does not want to see her prior surgeon. We will also go ahead and treat with [facility] hemorrhoid cream compounded with 0.125% nitroglycerin.  She will use this 3 times daily for at least 2 weeks with a goal of continuing 1 week after symptom resolution.  She is to call in 2 weeks with a progress report. Obtain CBC to ensure no developing anemia. Continue MiraLAX  daily Follow-up after procedure

## 2019-09-14 NOTE — Assessment & Plan Note (Addendum)
Intermittent tissue hematochezia present for over 1 year.  History of prolapsed grade 3 and grade 4 hemorrhoids undergoing hemorrhoidectomy with hemorrhoid ligation and pexy and ligation and pexy of all anorectal columns in June 2019 by Dr. Michael Boston.  Since surgery, her intermittent tissue hematochezia has continued.  Also with persistent pain with passing any formed stool per rectum, occasional shooting rectal pain, and persistent burning with all BM since surgery.  Patient reports completing several rounds of diltiazem without any improvement.  Has been referred to physical therapy which recommended anal dilators and stretches.  Denies abdominal pain, melena, or unintentional weight loss.  No family history of colon cancer.  Rectal exam with nontender external hemorrhoid versus skin tag.  No obvious anal fissure.  Internal exam limited due to increased sphincter tone with some discomfort.  No obvious masses or large internal hemorrhoids palpated.   Differentials for rectal bleeding include hemorrhoids versus anal fissure versus polyps versus malignancy.   Proceed with TCS with possible hemorrhoid banding as appropriate with Dr. Oneida Alar in the near future. The risks, benefits, and alternatives have been discussed in detail with patient. They have stated understanding and desire to proceed.  *Patient may need peds scope. Ultimately, patient may need referral back to surgery.  She does not want to see her prior surgeon. Will also go ahead and treat with [facility] hemorrhoid cream compounded with 0.125% nitroglycerin.  She will use this 3 times daily for at least 2 weeks with a goal of continuing 1 week after symptom resolution.  She is to call in 2 weeks with a progress report. Obtain CBC to ensure no developing anemia. Continue MiraLAX daily Follow-up after procedure

## 2019-09-14 NOTE — Assessment & Plan Note (Signed)
Intermittent GERD symptoms typically occurring with known triggers including tacos or pizza.  Currently using Tums as needed.  Reports symptoms were worse during pregnancy and required prescription medication management.  Denies upper abdominal pain, dysphagia, nausea, vomiting or regular NSAID use.   As symptoms are intermittent and typically occur with triggers, will focus on dietary modification at this time.  If symptoms worsen, could consider PPI therapy.  Avoid known reflux triggers. Follow GERD diet.  Avoid fried, greasy, fatty, spicy, and citrus foods.  Avoid caffeine, carbonated beverages, and chocolate.  Do not eat within 3 hours of laying down.  Handout provided. Follow-up after colonoscopy for lower GI symptoms as described above/below.

## 2019-09-14 NOTE — Assessment & Plan Note (Addendum)
33 year old female reporting rectal pain with passing any formed stool per rectum since hemorrhoidectomy in June 2019 by Dr. Michael Boston.  Currently taking MiraLAX daily to keep stools mushy.  Shooting rectal pain couple times a week.  Also with persistent intermittent tissue hematochezia that was present prior to hemorrhoidectomy that hasn't resolved.  Patient reports completing several rounds of diltiazem without any improvement.  Reports surgery referred her to physical therapy who recommended anal dilators and different stretches.  She has not purchased any anal dilators.  Denies abdominal pain, melena, or unintentional weight loss.  No family history of colon cancer.  Rectal exam with nontender external hemorrhoid versus skin tag.  No obvious anal fissure. Internal exam limited due to increased sphincter tone with some discomfort.  No obvious masses or large internal hemorrhoids palpated.   Unclear if symptoms are related to hemorrhoids versus anal fissure versus sequela from hemorrhoidectomy.  Cannot rule out colon polyps or malignancy.  Proceed with TCS with possible hemorrhoid banding as appropriate with Dr. Oneida Alar in the near future. The risks, benefits, and alternatives have been discussed in detail with patient. They have stated understanding and desire to proceed.  *Patient may need peds scope. Ultimately, patient may need referral back to surgery.  She does not want to see her prior surgeon. Will also go ahead and treat with [facility] hemorrhoid cream compounded with 0.125% nitroglycerin.  She will use this 3 times daily for at least 2 weeks with a goal of continuing 1 week after symptom resolution.  She is to call in 2 weeks with a progress report. Obtain CBC to ensure no developing anemia. Continue MiraLAX daily Follow-up after procedure

## 2019-09-16 ENCOUNTER — Encounter: Payer: BC Managed Care – PPO | Admitting: Physical Therapy

## 2019-09-16 ENCOUNTER — Encounter: Payer: Self-pay | Admitting: Obstetrics and Gynecology

## 2019-09-16 ENCOUNTER — Other Ambulatory Visit: Payer: Self-pay

## 2019-09-16 ENCOUNTER — Other Ambulatory Visit: Payer: Self-pay | Admitting: Obstetrics and Gynecology

## 2019-09-16 ENCOUNTER — Ambulatory Visit (INDEPENDENT_AMBULATORY_CARE_PROVIDER_SITE_OTHER): Payer: BC Managed Care – PPO | Admitting: Obstetrics and Gynecology

## 2019-09-16 VITALS — BP 110/65 | HR 71 | Ht 66.0 in

## 2019-09-16 DIAGNOSIS — R87610 Atypical squamous cells of undetermined significance on cytologic smear of cervix (ASC-US): Secondary | ICD-10-CM

## 2019-09-16 DIAGNOSIS — N870 Mild cervical dysplasia: Secondary | ICD-10-CM | POA: Diagnosis not present

## 2019-09-16 DIAGNOSIS — Z3202 Encounter for pregnancy test, result negative: Secondary | ICD-10-CM | POA: Diagnosis not present

## 2019-09-16 LAB — POCT URINE PREGNANCY: Preg Test, Ur: NEGATIVE

## 2019-09-16 NOTE — Progress Notes (Signed)
    GYNECOLOGY CLINIC COLPOSCOPY PROCEDURE NOTE  33 y.o. [id number] here for colposcopy for ASCUS with POSITIVE high risk HPV pap smear /Discussed role for HPV in cervical dysplasia, need for surveillance.  Patient given informed consent, signed copy in the chart, time out was performed.  Placed in lithotomy position. Cervix viewed with speculum and colposcope after application of acetic acid.   Colposcopy adequate? Yes  acetowhite lesion(s) noted at 12 o'clock; corresponding biopsies obtained.  ECC specimen obtained. All specimens were labeled and sent to pathology.  Patient was given post procedure instructions.  Will follow up pathology and manage accordingly; patient will be contacted with results and recommendations.  Routine preventative health maintenance measures emphasized.    Jonnie Kind, MD

## 2019-09-17 LAB — CBC WITH DIFFERENTIAL/PLATELET
Basophils Absolute: 0 10*3/uL (ref 0.0–0.2)
Basos: 0 %
EOS (ABSOLUTE): 0.1 10*3/uL (ref 0.0–0.4)
Eos: 1 %
Hematocrit: 38.1 % (ref 34.0–46.6)
Hemoglobin: 13.3 g/dL (ref 11.1–15.9)
Immature Grans (Abs): 0 10*3/uL (ref 0.0–0.1)
Immature Granulocytes: 0 %
Lymphocytes Absolute: 2.4 10*3/uL (ref 0.7–3.1)
Lymphs: 28 %
MCH: 30.4 pg (ref 26.6–33.0)
MCHC: 34.9 g/dL (ref 31.5–35.7)
MCV: 87 fL (ref 79–97)
Monocytes Absolute: 0.4 10*3/uL (ref 0.1–0.9)
Monocytes: 5 %
Neutrophils Absolute: 5.6 10*3/uL (ref 1.4–7.0)
Neutrophils: 66 %
Platelets: 232 10*3/uL (ref 150–450)
RBC: 4.38 x10E6/uL (ref 3.77–5.28)
RDW: 11.9 % (ref 11.7–15.4)
WBC: 8.5 10*3/uL (ref 3.4–10.8)

## 2019-09-17 NOTE — Progress Notes (Signed)
CBC within normal limits. No anemia.

## 2019-09-18 ENCOUNTER — Telehealth: Payer: Self-pay | Admitting: *Deleted

## 2019-09-18 NOTE — Telephone Encounter (Signed)
-----   Message from Jonnie Kind, MD sent at 09/18/2019 11:56 AM EST ----- Low grade cervical abnormalities. Recommend 1 year followup with HPV testing. Will send note for staff to confirm 1 yr recall plans.

## 2019-09-18 NOTE — Telephone Encounter (Signed)
Message to patient with results. Daisy to put patient on recall list.

## 2019-09-22 ENCOUNTER — Encounter: Payer: Self-pay | Admitting: *Deleted

## 2019-09-22 ENCOUNTER — Other Ambulatory Visit: Payer: Self-pay

## 2019-09-22 ENCOUNTER — Ambulatory Visit (INDEPENDENT_AMBULATORY_CARE_PROVIDER_SITE_OTHER): Payer: BC Managed Care – PPO | Admitting: *Deleted

## 2019-09-22 ENCOUNTER — Encounter: Payer: BC Managed Care – PPO | Admitting: Physical Therapy

## 2019-09-22 DIAGNOSIS — Z3042 Encounter for surveillance of injectable contraceptive: Secondary | ICD-10-CM

## 2019-09-22 MED ORDER — MEDROXYPROGESTERONE ACETATE 150 MG/ML IM SUSP
150.0000 mg | Freq: Once | INTRAMUSCULAR | Status: AC
  Administered 2019-09-22: 150 mg via INTRAMUSCULAR

## 2019-09-22 NOTE — Progress Notes (Signed)
   NURSE VISIT- INJECTION  SUBJECTIVE:  Gail Fields is a 33 y.o. [phone] female here for a Depo Provera for contraception/period management. She is a GYN patient.   OBJECTIVE:  There were no vitals taken for this visit.  Appears well, in no apparent distress  Injection administered in: Left deltoid  Meds ordered this encounter  Medications  . medroxyPROGESTERone (DEPO-PROVERA) injection 150 mg    ASSESSMENT: GYN patient Depo Provera for contraception/period management PLAN: Follow-up: in 11-13 weeks for next Depo   Gail Fields  09/22/2019 3:56 PM

## 2019-09-23 ENCOUNTER — Ambulatory Visit: Payer: BC Managed Care – PPO | Admitting: Physical Therapy

## 2019-09-25 ENCOUNTER — Ambulatory Visit: Payer: BC Managed Care – PPO

## 2019-10-07 ENCOUNTER — Ambulatory Visit: Payer: BC Managed Care – PPO | Admitting: Physical Therapy

## 2019-10-21 ENCOUNTER — Encounter: Payer: BC Managed Care – PPO | Admitting: Physical Therapy

## 2019-12-15 ENCOUNTER — Ambulatory Visit: Payer: BC Managed Care – PPO

## 2019-12-16 ENCOUNTER — Ambulatory Visit: Payer: BC Managed Care – PPO

## 2019-12-17 ENCOUNTER — Ambulatory Visit: Payer: BC Managed Care – PPO

## 2019-12-18 ENCOUNTER — Other Ambulatory Visit: Payer: Self-pay

## 2019-12-18 ENCOUNTER — Ambulatory Visit (INDEPENDENT_AMBULATORY_CARE_PROVIDER_SITE_OTHER): Payer: BC Managed Care – PPO | Admitting: *Deleted

## 2019-12-18 DIAGNOSIS — Z1389 Encounter for screening for other disorder: Secondary | ICD-10-CM

## 2019-12-18 DIAGNOSIS — Z331 Pregnant state, incidental: Secondary | ICD-10-CM

## 2019-12-18 DIAGNOSIS — Z3042 Encounter for surveillance of injectable contraceptive: Secondary | ICD-10-CM | POA: Diagnosis not present

## 2019-12-18 MED ORDER — MEDROXYPROGESTERONE ACETATE 150 MG/ML IM SUSP
150.0000 mg | Freq: Once | INTRAMUSCULAR | Status: AC
  Administered 2019-12-18: 150 mg via INTRAMUSCULAR

## 2019-12-18 NOTE — Progress Notes (Signed)
   NURSE VISIT- INJECTION  SUBJECTIVE:  Gail Fields is a 34 y.o. [phone] female here for a Depo Provera for contraception/period management. She is a GYN patient.   OBJECTIVE:  There were no vitals taken for this visit.  Appears well, in no apparent distress  Injection administered in: Right deltoid  Meds ordered this encounter  Medications  . medroxyPROGESTERone (DEPO-PROVERA) injection 150 mg    ASSESSMENT: GYN patient Depo Provera for contraception/period management PLAN: Follow-up: in 11-13 weeks for next Depo   Jobe Marker  12/18/2019 4:09 PM

## 2019-12-24 ENCOUNTER — Ambulatory Visit (INDEPENDENT_AMBULATORY_CARE_PROVIDER_SITE_OTHER): Payer: BC Managed Care – PPO | Admitting: Obstetrics and Gynecology

## 2019-12-24 ENCOUNTER — Other Ambulatory Visit: Payer: Self-pay | Admitting: Obstetrics and Gynecology

## 2019-12-24 ENCOUNTER — Encounter: Payer: Self-pay | Admitting: Obstetrics and Gynecology

## 2019-12-24 ENCOUNTER — Other Ambulatory Visit: Payer: Self-pay

## 2019-12-24 VITALS — BP 117/77 | HR 68 | Ht 66.5 in | Wt 174.0 lb

## 2019-12-24 DIAGNOSIS — N898 Other specified noninflammatory disorders of vagina: Secondary | ICD-10-CM | POA: Diagnosis not present

## 2019-12-24 DIAGNOSIS — R8290 Unspecified abnormal findings in urine: Secondary | ICD-10-CM | POA: Diagnosis not present

## 2019-12-24 DIAGNOSIS — N909 Noninflammatory disorder of vulva and perineum, unspecified: Secondary | ICD-10-CM

## 2019-12-24 DIAGNOSIS — R82998 Other abnormal findings in urine: Secondary | ICD-10-CM | POA: Diagnosis not present

## 2019-12-24 LAB — POCT URINALYSIS DIPSTICK
Blood, UA: NEGATIVE
Glucose, UA: NEGATIVE
Ketones, UA: NEGATIVE
Leukocytes, UA: NEGATIVE
Nitrite, UA: NEGATIVE
Protein, UA: NEGATIVE

## 2019-12-24 LAB — POCT WET PREP WITH KOH
Trichomonas, UA: NEGATIVE
Yeast Wet Prep HPF POC: NEGATIVE

## 2019-12-24 MED ORDER — FLUCONAZOLE 150 MG PO TABS: 150.0000 mg | ORAL_TABLET | ORAL | 1 refills | Status: DC

## 2019-12-24 MED ORDER — SULFAMETHOXAZOLE-TRIMETHOPRIM 400-80 MG PO TABS: 1.0000 | ORAL_TABLET | Freq: Two times a day (BID) | ORAL | 0 refills | Status: DC

## 2019-12-24 NOTE — Progress Notes (Signed)
Patient ID: Gail Fields, female   DOB: [birth year], 34 y.o.   MRN: [id number]    [facility] Clinic Visit  @DATE @            Patient name: Gail Fields MRN Gail Fields  Date of birth: January 25, 1986  CC & HPI:  Gail Fields is a 34 y.o. female presenting today for pain in right breast. She also notes burning with urination. Urine is dark and has odor.   She believes she slept poorly on her right breast a few nights ago. She no longer has pain on right breast. She also notes some symptoms of UTI with burning urination and minimal cramping.  She has been with her current partner for the past 7 years.   ROS:  ROS +dark urine +urine burning w/ odor +cramping +vaginal whitish discharge -breast pain -nipple discharge -fever  All systems are negative except as noted in the HPI and PMH.   Pertinent History Reviewed:   Reviewed: Medical         Past Medical History:  Diagnosis Date  . Acute cystitis   . BV (bacterial vaginosis)   . Hemorrhoid   . Hx of chlamydia infection   . Patellofemoral stress syndrome   . 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 Trichimoniasis   . Vaginal Pap smear, abnormal 09/22/14   ASCUS- +HPV  . Yeast infection                               Surgical Hx:    Past Surgical History:  Procedure Laterality Date  . HEMORRHOID SURGERY N/A 04/24/2018   Procedure: HEMORRHOIDECTOMY;  Surgeon: 04/26/2018, MD;  Location: [facility];  Service: General;  Laterality: N/A;  . PEXY N/A 04/24/2018   Procedure: HEMORRHOIDAL LIGATION/PEXY;  Surgeon: 04/26/2018, MD;  Location: [facility];  Service: General;  Laterality: N/A;  . RECTAL EXAM UNDER ANESTHESIA N/A 04/24/2018   Procedure: ANORECTAL EXAM UNDER ANESTHESIA;  Surgeon: 04/26/2018, MD;  Location: [facility];  Service: General;  Laterality: N/A;   Medications: Reviewed & Updated - see associated section                       Current Outpatient Medications:  .  medroxyPROGESTERone (DEPO-PROVERA) 150 MG/ML injection,  INJECT 1 ML IN THE MUSCLE EVERY 3 MONTHS, Disp: 1 mL, Rfl: 3 .  polyethylene glycol (MIRALAX / GLYCOLAX) 17 g packet, Take 17 g by mouth daily., Disp: , Rfl:  No current facility-administered medications for this visit.  Facility-Administered Medications Ordered in Other Visits:  .  lidocaine 2 % w/epi 1:200,000 (20 ml) with sod bicarb 8.4 % (2 ml) inj, , , Continuous PRN, Gail Fields, Debra R, CRNA, 5 mL at 08/18/15 2327   Social History: Reviewed -  reports that she has never smoked. She has never used smokeless tobacco.  Objective Findings:  Vitals: Blood pressure 117/77, pulse 68, height 5' 6.5" (1.689 m), weight 174 lb (78.9 kg).  PHYSICAL EXAMINATION General appearance - alert, well appearing, and in no distress Mental status - normal mood, behavior, speech, dress, motor activity, and thought processes, affect appropriate to mood  PELVIC Vagina - whitish discharge, small almost blister-like at vaginal entrance.  Cervix - normal  Uterus - not examined  [place] - desiccated, negative yeast HSV specimen collected  Assessment & Plan:   A:  1. rule out HSV 2. Possible yeast  infection 3. Possible UTI  P:  1. Septra DS 2. Diflucan 150 mg   By signing my name below, I, [name], attest that this documentation has been prepared under the direction and in the presence of Jonnie Kind, MD. Electronically Signed: [place]. 12/24/19. 4:19 PM.  I personally performed the services described in this documentation, which was SCRIBED in my presence. The recorded information has been reviewed and considered accurate. It has been edited as necessary during review. Jonnie Kind, MD

## 2019-12-28 LAB — HERPES SIMPLEX VIRUS CULTURE

## 2019-12-29 ENCOUNTER — Encounter: Payer: Self-pay | Admitting: *Deleted

## 2019-12-29 ENCOUNTER — Other Ambulatory Visit: Payer: Self-pay | Admitting: Obstetrics and Gynecology

## 2019-12-29 MED ORDER — METRONIDAZOLE 0.75 % VA GEL: 1.0000 | VAGINAL | 1 refills | Status: DC

## 2019-12-29 NOTE — Progress Notes (Signed)
Note to Ms Comes There was no herpes isolated. There was only one small 2 mm lesion, and it was not weeping, just sensitive. This could have been an abrasion from scratching, or the test was negative because of the small amount of tissue fluid obtained. One available test is a blood test, herpes antibody. This is usually positive if there has been exposure to herpes at any time in the past.  Another option is to believe the negative test, and simply follow for future development of a sore at exactly the same spot, which could be tested again if it happens. Please make a telephone visit if this raises more questions than answers. You may also respond in MyChart

## 2019-12-29 NOTE — Progress Notes (Signed)
Rx metrogel qod hs.

## 2020-01-01 ENCOUNTER — Other Ambulatory Visit: Payer: Self-pay

## 2020-01-01 ENCOUNTER — Other Ambulatory Visit (HOSPITAL_COMMUNITY)
Admission: RE | Admit: 2020-01-01 | Discharge: 2020-01-01 | Disposition: A | Discharge status: Home / Self Care | Payer: BC Managed Care – PPO | Source: Ambulatory Visit | Attending: Gastroenterology | Admitting: Gastroenterology

## 2020-01-01 ENCOUNTER — Telehealth: Payer: Self-pay | Admitting: Gastroenterology

## 2020-01-01 DIAGNOSIS — Z20822 Contact with and (suspected) exposure to covid-19: Secondary | ICD-10-CM | POA: Insufficient documentation

## 2020-01-01 DIAGNOSIS — Z01812 Encounter for preprocedural laboratory examination: Secondary | ICD-10-CM | POA: Insufficient documentation

## 2020-01-01 LAB — SARS CORONAVIRUS 2 (TAT 6-24 HRS): SARS Coronavirus 2: NEGATIVE

## 2020-01-01 MED ORDER — PEG 3350-KCL-NA BICARB-NACL 420 G PO SOLR: 4000.0000 mL | ORAL | 0 refills | Status: DC

## 2020-01-01 NOTE — Telephone Encounter (Signed)
Rx sent to pharmacy. Pt is aware.  

## 2020-01-01 NOTE — Telephone Encounter (Signed)
Pt is scheduled with SF tomorrow and said [facility] doesn't have her prep. I told her it may be one of the ones on backorder and I would let the scheduler known and see if they can call something else in. Please advise. [phone]

## 2020-01-02 ENCOUNTER — Encounter (HOSPITAL_COMMUNITY): Payer: Self-pay | Admitting: Gastroenterology

## 2020-01-02 ENCOUNTER — Encounter (HOSPITAL_COMMUNITY)
Admission: RE | Disposition: A | Discharge status: Home / Self Care | Payer: Self-pay | Source: Home / Self Care | Attending: Gastroenterology

## 2020-01-02 ENCOUNTER — Ambulatory Visit (HOSPITAL_COMMUNITY)
Admission: RE | Admit: 2020-01-02 | Discharge: 2020-01-02 | Disposition: A | Discharge status: Home / Self Care | Payer: BC Managed Care – PPO | Attending: Gastroenterology | Admitting: Gastroenterology

## 2020-01-02 ENCOUNTER — Other Ambulatory Visit: Payer: Self-pay

## 2020-01-02 DIAGNOSIS — K625 Hemorrhage of anus and rectum: Secondary | ICD-10-CM

## 2020-01-02 DIAGNOSIS — K921 Melena: Secondary | ICD-10-CM | POA: Diagnosis not present

## 2020-01-02 DIAGNOSIS — Q438 Other specified congenital malformations of intestine: Secondary | ICD-10-CM | POA: Insufficient documentation

## 2020-01-02 DIAGNOSIS — K6289 Other specified diseases of anus and rectum: Secondary | ICD-10-CM | POA: Diagnosis not present

## 2020-01-02 DIAGNOSIS — K649 Unspecified hemorrhoids: Secondary | ICD-10-CM

## 2020-01-02 DIAGNOSIS — Z793 Long term (current) use of hormonal contraceptives: Secondary | ICD-10-CM | POA: Diagnosis not present

## 2020-01-02 DIAGNOSIS — K624 Stenosis of anus and rectum: Secondary | ICD-10-CM | POA: Insufficient documentation

## 2020-01-02 HISTORY — PX: COLONOSCOPY: SHX5424

## 2020-01-02 SURGERY — COLONOSCOPY
Anesthesia: Moderate Sedation

## 2020-01-02 MED ORDER — OMEPRAZOLE 20 MG PO CPDR: DELAYED_RELEASE_CAPSULE | ORAL | 3 refills | Status: AC

## 2020-01-02 MED ORDER — SODIUM CHLORIDE 0.9 % IV SOLN: INTRAVENOUS | Status: DC

## 2020-01-02 MED ORDER — MIDAZOLAM HCL 5 MG/5ML IJ SOLN
INTRAMUSCULAR | Status: DC | PRN
  Administered 2020-01-02 (×3): 2 mg via INTRAVENOUS

## 2020-01-02 MED ORDER — MEPERIDINE HCL 100 MG/ML IJ SOLN
INTRAMUSCULAR | Status: DC | PRN
  Administered 2020-01-02 (×3): 25 mg

## 2020-01-02 MED ORDER — MIDAZOLAM HCL 5 MG/5ML IJ SOLN
INTRAMUSCULAR | Status: AC
  Filled 2020-01-02: qty 10

## 2020-01-02 MED ORDER — MEPERIDINE HCL 100 MG/ML IJ SOLN
INTRAMUSCULAR | Status: AC
  Filled 2020-01-02: qty 2

## 2020-01-02 NOTE — Telephone Encounter (Signed)
REFER PT TO SURGERY AT [facility], Dx: RECTAL PAIN/BLEEDING AND ANAL TAG AFTER HEMORRHOIDECTOMY.

## 2020-01-02 NOTE — H&P (Addendum)
Primary Care Physician:  Lazaro Arms, MD Primary Gastroenterologist:  Dr. Darrick Penna  Pre-Procedure History & Physical: HPI:  Gail Fields is a 34 y.o. female here for  BRBPR.  Past Medical History:  Diagnosis Date  . Acute cystitis   . BV (bacterial vaginosis)   . Hemorrhoid   . Hx of chlamydia infection   . Patellofemoral stress syndrome   . 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 Trichimoniasis   . Vaginal Pap smear, abnormal 09/22/14   ASCUS- +HPV  . Yeast infection     Past Surgical History:  Procedure Laterality Date  . HEMORRHOID SURGERY N/A 04/24/2018   Procedure: HEMORRHOIDECTOMY;  Surgeon: Karie Soda, MD;  Location: [facility];  Service: General;  Laterality: N/A;  . PEXY N/A 04/24/2018   Procedure: HEMORRHOIDAL LIGATION/PEXY;  Surgeon: Karie Soda, MD;  Location: [facility];  Service: General;  Laterality: N/A;  . RECTAL EXAM UNDER ANESTHESIA N/A 04/24/2018   Procedure: ANORECTAL EXAM UNDER ANESTHESIA;  Surgeon: Karie Soda, MD;  Location: [facility];  Service: General;  Laterality: N/A;    Prior to Admission medications   Medication Sig Start Date End Date Taking? Authorizing Provider  medroxyPROGESTERone (DEPO-PROVERA) 150 MG/ML injection INJECT 1 ML IN THE MUSCLE EVERY 3 MONTHS Patient taking differently: Inject 150 mg into the muscle every 3 (three) months.  02/21/19  Yes Cheral Marker, CNM  metroNIDAZOLE (METROGEL) 0.75 % vaginal gel Place 1 Applicatorful vaginally every other day. Apply one applicatorful to vagina at bedtime 12/29/19  Yes Tilda Burrow, MD  polyethylene glycol (MIRALAX / GLYCOLAX) 17 g packet Take 17 g by mouth daily.   Yes [provider]  sulfamethoxazole-trimethoprim (BACTRIM) 400-80 MG tablet Take 1 tablet by mouth 2 (two) times daily. Twice daily for uti 12/24/19  Yes Tilda Burrow, MD  fluconazole (DIFLUCAN) 150 MG tablet Take 1 tablet (150 mg total) by mouth every 3 (three) days. 12/24/19   Tilda Burrow, MD  polyethylene  glycol-electrolytes (TRILYTE) 420 g solution Take 4,000 mLs by mouth as directed. 01/01/20   [place], MD    Allergies as of 09/11/2019  . (No Known Allergies)    Family History  Problem Relation Age of Onset  . Cancer Maternal Grandmother        breast  . Seizures Paternal Grandmother   . Colon cancer Neg Hx     Social History   Socioeconomic History  . Marital status: Single    Spouse name: Not on file  . Number of children: Not on file  . Years of education: Not on file  . Highest education level: Not on file  Occupational History  . Not on file  Tobacco Use  . Smoking status: Never Smoker  . Smokeless tobacco: Never Used  Substance and Sexual Activity  . Alcohol use: No  . Drug use: No  . Sexual activity: Yes    Birth control/protection: Injection  Other Topics Concern  . Not on file  Social History Narrative  . Not on file   Social Determinants of Health   Financial Resource Strain:   . Difficulty of Paying Living Expenses: Not on file  Food Insecurity:   . Worried About Programme researcher, broadcasting/film/video in the Last Year: Not on file  . Ran Out of Food in the Last Year: Not on file  Transportation Needs:   . Lack of Transportation (Medical): Not on file  . Lack of Transportation (Non-Medical): Not on file  Physical  Activity:   . Days of Exercise per Week: Not on file  . Minutes of Exercise per Session: Not on file  Stress:   . Feeling of Stress : Not on file  Social Connections:   . Frequency of Communication with Friends and Family: Not on file  . Frequency of Social Gatherings with Friends and Family: Not on file  . Attends Religious Services: Not on file  . Active Member of Clubs or Organizations: Not on file  . Attends Archivist Meetings: Not on file  . Marital Status: Not on file  Intimate Partner Violence:   . Fear of Current or Ex-Partner: Not on file  . Emotionally Abused: Not on file  . Physically Abused: Not on file  . Sexually Abused:  Not on file    Review of Systems: See HPI, otherwise negative ROS   Physical Exam: BP 110/67   Pulse (!) 54   Temp 99.2 F (37.3 C)   Resp 20   Ht 5' 6.5" (1.689 m)   SpO2 100%   BMI 27.66 kg/m  General:   Alert,  pleasant and cooperative in NAD Head:  Normocephalic and atraumatic. Neck:  Supple; Lungs:  Clear throughout to auscultation.    Heart:  Regular rate and rhythm. Abdomen:  Soft, nontender and nondistended. Normal bowel sounds, without guarding, and without rebound.   Neurologic:  Alert and  oriented x4;  grossly normal neurologically.  Impression/Plan:    BRBPR  PLAN: TCS/?hemorrhoid banding TODAY. DISCUSSED PROCEDURE, BENEFITS, & RISKS: < 1% chance of medication reaction, bleeding, perforation, ASPIRATION, PELVIC VEIN SEPSIS,or rupture of spleen/liver requiring surgery to fix it and missed polyps < 1 cm 10-20% of the time.

## 2020-01-02 NOTE — Op Note (Signed)
[facility] Patient Name: Gail Fields Procedure Date: 01/02/2020 11:42 AM MRN: [id number] Date of Birth: [birth year] Attending MD: Jonette Eva MD, MD CSN: [id number] Age: 34 Admit Type: Outpatient Procedure:                Colonoscopy, DIAGNOSTIC Indications:              Hematochezia, Rectal pain Providers:                Jonette Eva MD, MD, Jannett Celestine, RN, Edythe Clarity,                            Technician Referring MD:             Karie Soda MD, MD Medicines:                Meperidine 75 mg IV, Midazolam 6 mg IV Complications:            No immediate complications. Estimated Blood Loss:     Estimated blood loss: none. Procedure:                Pre-Anesthesia Assessment:                           - Prior to the procedure, a History and Physical                            was performed, and patient medications and                            allergies were reviewed. The patient's tolerance of                            previous anesthesia was also reviewed. The risks                            and benefits of the procedure and the sedation                            options and risks were discussed with the patient.                            All questions were answered, and informed consent                            was obtained. Prior Anticoagulants: The patient has                            taken no previous anticoagulant or antiplatelet                            agents. ASA Grade Assessment: I - A normal, healthy                            patient. After reviewing the risks and benefits,  the patient was deemed in satisfactory condition to                            undergo the procedure. After obtaining informed                            consent, the colonoscope was passed under direct                            vision. Throughout the procedure, the patient's                            blood pressure, pulse, and oxygen saturations were                         monitored continuously. The PCF-[id number] ([id number])                            scope was introduced through the anus and advanced                            to the 5 cm into the ileum. The terminal ileum,                            ileocecal valve, appendiceal orifice, and rectum                            were photographed. The colonoscopy was technically                            difficult and complex due to a tortuous colon.                            Successful completion of the procedure was aided by                            increasing the dose of sedation medication,                            straightening and shortening the scope to obtain                            bowel loop reduction and COLOWRAP. The patient                            tolerated the procedure fairly well. The quality of                            the bowel preparation was excellent. Scope In: 12:08:06 PM Scope Out: 12:27:00 PM Scope Withdrawal Time: 0 hours 12 minutes 44 seconds  Total Procedure Duration: 0 hours 18 minutes 54 seconds  Findings:      The terminal ileum appeared normal.      The recto-sigmoid colon, sigmoid colon, descending colon and splenic  flexure were moderately tortuous.      A scar was found in the mid rectum and in the distal rectum.      -ANAL STENOSIS IS PRESENT. Impression:               - The examined portion of the ileum was normal.                           - Tortuous LEFT colon.                           - Scar in the mid rectum and in the distal rectum                            DUE TO PRIOR HEMORRHOIDECTOMY.                           - ANAL STENOSIS Moderate Sedation:      Moderate (conscious) sedation was administered by the endoscopy nurse       and supervised by the endoscopist. The following parameters were       monitored: oxygen saturation, heart rate, blood pressure, and response       to care. Total physician intraservice time was 35  minutes. Recommendation:           - Patient has a contact number available for                            emergencies. The signs and symptoms of potential                            delayed complications were discussed with the                            patient. Return to normal activities tomorrow.                            Written discharge instructions were provided to the                            patient.                           - High fiber diet.                           - Continue present medications. MIRALAX to soften                            stools.                           - Await pathology results. COMPLETE THERAPY/ANAL                            DILATION.                           -  Repeat colonoscopy AT AGE 20 for surveillance.                           - Return to GI office in 6 months. Procedure Code(s):        --- Professional ---                           [phone], Colonoscopy, flexible; diagnostic, including                            collection of specimen(s) by brushing or washing,                            when performed (separate procedure)                           99153, Moderate sedation; each additional 15                            minutes intraservice time                           G0500, Moderate sedation services provided by the                            same physician or other qualified health care                            professional performing a gastrointestinal                            endoscopic service that sedation supports,                            requiring the presence of an independent trained                            observer to assist in the monitoring of the                            patient's level of consciousness and physiological                            status; initial 15 minutes of intra-service time;                            patient age 4 years or older (additional time may                            be reported  with [phone], as appropriate) Diagnosis Code(s):        --- Professional ---                           K62.89, Other specified diseases of anus and rectum  K92.1, Melena (includes Hematochezia)                           Q43.8, Other specified congenital malformations of                            intestine CPT copyright 2019 American Medical Association. All rights reserved. The codes documented in this report are preliminary and upon coder review may  be revised to meet current compliance requirements. Jonette Eva, MD Jonette Eva MD, MD 01/02/2020 12:50:12 PM This report has been signed electronically. Number of Addenda: 0

## 2020-01-02 NOTE — Discharge Instructions (Signed)
I SEE POSTSURGICAL CHANGES IN YOUR RECTUM. THE BLEEDING AND PAIN ARE DUE TO NARROWING OF YOUR ANUS WHICH MAKES IT HARD TO PASS NORMAL SIZE STOOL. YOU DID NOT HAVE ANY POLYPS.   CONTINUE TO SOFTEN YOUR STOOL WITH MIRALAX.  COMPLETE THERAPY AND DILATION TO REDUCE RECTAL PAIN/BLEEDING.  Use APOTHECARY RECTAL CREAM to reduce pain, SHRINK THE TAG, and REDUCE bleeding.   DRINK WATER TO KEEP YOUR URINE LIGHT YELLOW.  EAT FIBER. AVOID ITEMS THAT CAUSE BLOATING. SEE INFO BELOW.   TO TREAT REFLUX,  START OMEPRAZOLE.  TAKE 30 MINUTES PRIOR TO YOUR FIRST MEAL.  USE PREPARATION H FOUR TIMES  A DAY IF NEEDED TO RELIEVE RECTAL PAIN/PRESSURE/BLEEDING.   SEE SURGERY AT [facility] FOR A SECOND OPINION REGARDING RECTAL PAIN/BLEEDING AND ANAL TAG.  FOLLOW UP IN 6 MOS.     Message left at the office to contact patient regarding follow up appointment  Next colonoscopy AT AGE 34.  Colonoscopy Care After Read the instructions outlined below and refer to this sheet in the next week. These discharge instructions provide you with general information on caring for yourself after you leave the hospital. While your treatment has been planned according to the most current medical practices available, unavoidable complications occasionally occur. If you have any problems or questions after discharge, call DR. Montina Dorrance, [phone].  ACTIVITY  You may resume your regular activity, but move at a slower pace for the next 24 hours.   Take frequent rest periods for the next 24 hours.   Walking will help get rid of the air and reduce the bloated feeling in your belly (abdomen).   No driving for 24 hours (because of the medicine (anesthesia) used during the test).   You may shower.   Do not sign any important legal documents or operate any machinery for 24 hours (because of the anesthesia used during the test).    NUTRITION  Drink plenty of fluids.   You may resume your normal diet as instructed by your  doctor.   Begin with a light meal and progress to your normal diet. Heavy or fried foods are harder to digest and may make you feel sick to your stomach (nauseated).   Avoid alcoholic beverages for 24 hours or as instructed.    MEDICATIONS  You may resume your normal medications.   WHAT YOU CAN EXPECT TODAY  Some feelings of bloating in the abdomen.   Passage of more gas than usual.   Spotting of blood in your stool or on the toilet paper  .  IF YOU HAD POLYPS REMOVED DURING THE COLONOSCOPY:  Eat a soft diet IF YOU HAVE NAUSEA, BLOATING, ABDOMINAL PAIN, OR VOMITING.    FINDING OUT THE RESULTS OF YOUR TEST Not all test results are available during your visit. DR. Darrick Penna WILL CALL YOU WITHIN 14 DAYS OF YOUR PROCEDUE WITH YOUR RESULTS. Do not assume everything is normal if you have not heard from DR. Shanara Schnieders, CALL HER OFFICE AT [phone].  SEEK IMMEDIATE MEDICAL ATTENTION AND CALL THE OFFICE: [phone] IF:  You have more than a spotting of blood in your stool.   Your belly is swollen (abdominal distention).   You are nauseated or vomiting.   You have a temperature over 101F.   You have abdominal pain or discomfort that is severe or gets worse throughout the day.

## 2020-01-05 ENCOUNTER — Other Ambulatory Visit: Payer: Self-pay

## 2020-01-05 DIAGNOSIS — K6289 Other specified diseases of anus and rectum: Secondary | ICD-10-CM

## 2020-01-05 DIAGNOSIS — K625 Hemorrhage of anus and rectum: Secondary | ICD-10-CM

## 2020-01-05 NOTE — Telephone Encounter (Signed)
Referral faxed to [facility].

## 2020-02-24 ENCOUNTER — Other Ambulatory Visit: Payer: Self-pay | Admitting: Obstetrics and Gynecology

## 2020-03-15 ENCOUNTER — Ambulatory Visit: Payer: BC Managed Care – PPO

## 2020-03-16 ENCOUNTER — Ambulatory Visit (INDEPENDENT_AMBULATORY_CARE_PROVIDER_SITE_OTHER): Payer: BC Managed Care – PPO | Admitting: *Deleted

## 2020-03-16 ENCOUNTER — Other Ambulatory Visit: Payer: Self-pay | Admitting: *Deleted

## 2020-03-16 ENCOUNTER — Telehealth: Payer: Self-pay | Admitting: *Deleted

## 2020-03-16 ENCOUNTER — Telehealth: Payer: Self-pay | Admitting: Obstetrics and Gynecology

## 2020-03-16 ENCOUNTER — Other Ambulatory Visit: Payer: Self-pay | Admitting: Women's Health

## 2020-03-16 DIAGNOSIS — Z3042 Encounter for surveillance of injectable contraceptive: Secondary | ICD-10-CM

## 2020-03-16 MED ORDER — MEDROXYPROGESTERONE ACETATE 150 MG/ML IM SUSP: 150.0000 mg | INTRAMUSCULAR | 0 refills | Status: DC

## 2020-03-16 MED ORDER — MEDROXYPROGESTERONE ACETATE 150 MG/ML IM SUSP
150.0000 mg | Freq: Once | INTRAMUSCULAR | Status: AC
  Administered 2020-03-16: 150 mg via INTRAMUSCULAR

## 2020-03-16 NOTE — Telephone Encounter (Signed)
Patient called stating that she would like for a nurse to call in her depo, patient states that she has an appointment today at 3:30 pm, please contact pt when sent

## 2020-03-16 NOTE — Telephone Encounter (Signed)
Depo sent to [facility] as this is the pharmacy we have on file for her.  Pt upset that we did not send to [facility].  Will call in verbal order for Depo and change pharmacy we have on file.

## 2020-03-16 NOTE — Progress Notes (Signed)
   NURSE VISIT- INJECTION  SUBJECTIVE:  Gail Fields is a 34 y.o. [phone] female here for a Depo Provera for contraception/period management. She is a GYN patient.   OBJECTIVE:  There were no vitals taken for this visit.  Appears well, in no apparent distress  Injection administered in: Left deltoid  Meds ordered this encounter  Medications  . medroxyPROGESTERone (DEPO-PROVERA) injection 150 mg    ASSESSMENT: GYN patient Depo Provera for contraception/period management PLAN: Follow-up: in 11-13 weeks for next Depo   Jobe Marker  03/16/2020 4:21 PM

## 2020-03-17 ENCOUNTER — Other Ambulatory Visit: Payer: Self-pay | Admitting: *Deleted

## 2020-03-17 MED ORDER — MEDROXYPROGESTERONE ACETATE 150 MG/ML IM SUSP: 150.0000 mg | INTRAMUSCULAR | 0 refills | Status: DC

## 2020-04-20 ENCOUNTER — Encounter: Payer: Self-pay | Admitting: Gastroenterology

## 2020-05-20 ENCOUNTER — Other Ambulatory Visit: Payer: Self-pay

## 2020-05-20 ENCOUNTER — Ambulatory Visit (INDEPENDENT_AMBULATORY_CARE_PROVIDER_SITE_OTHER)
Admission: RE | Admit: 2020-05-20 | Discharge: 2020-05-20 | Disposition: A | Discharge status: Home / Self Care | Payer: BC Managed Care – PPO | Source: Ambulatory Visit

## 2020-05-20 DIAGNOSIS — R109 Unspecified abdominal pain: Secondary | ICD-10-CM | POA: Diagnosis not present

## 2020-05-20 DIAGNOSIS — R1030 Lower abdominal pain, unspecified: Secondary | ICD-10-CM

## 2020-05-20 DIAGNOSIS — R350 Frequency of micturition: Secondary | ICD-10-CM

## 2020-05-20 DIAGNOSIS — R300 Dysuria: Secondary | ICD-10-CM

## 2020-05-20 MED ORDER — FLUCONAZOLE 150 MG PO TABS: ORAL_TABLET | ORAL | 0 refills | Status: DC

## 2020-05-20 MED ORDER — NITROFURANTOIN MONOHYD MACRO 100 MG PO CAPS: 100.0000 mg | ORAL_CAPSULE | Freq: Two times a day (BID) | ORAL | 0 refills | Status: DC

## 2020-05-20 NOTE — Discharge Instructions (Signed)
I will treat you for a UTI  I have sent in Macrobid twice a day for 5 days  I have sent in Diflucan in case of yeast  Follow up in person if symptoms are persisting

## 2020-05-20 NOTE — ED Provider Notes (Signed)
[facility]  Virtual Visit via Video Note:  Gail Fields  initiated request for Telemedicine visit with [facility] Urgent Care team. I connected with Gail Fields  on 05/20/2020 at 11:09 AM  for a synchronized telemedicine visit using a video enabled HIPPA compliant telemedicine application. I verified that I am speaking with Gail Fields  using two identifiers. Gail Lex, NP  was physically located in a [facility] Urgent care site and Gail Fields was located at a different location.   The limitations of evaluation and management by telemedicine as well as the availability of in-person appointments were discussed. Patient was informed that she  may incur a bill ( including co-pay) for this virtual visit encounter. Gail Fields  expressed understanding and gave verbal consent to proceed with virtual visit.   [id number] 05/20/20 Arrival Time: 1100  CC:UTI SUBJECTIVE: History from: patient.  Gail Fields is a 34 y.o. female who presents with gradual onset of dysuria, left-sided flank pain, low abdominal pain.  Reports that she has history of UTIs and yeast infections.  Reports that her urine is also dark color.  Reports urinary frequency and urgency.  Has not taken anything over-the-counter for this. Denies sick exposure to strep, flu or mono, or precipitating event.  Symptoms are aggravated by urinating.  There are no alleviating factors.   Denies fever, chills, fatigue, ear pain, sinus pain, rhinorrhea, nasal congestion, cough, SOB, wheezing, chest pain, nausea, rash, changes in bowel habits.    ROS: As per HPI.  All other pertinent ROS negative.     Past Medical History:  Diagnosis Date  . Acute cystitis   . BV (bacterial vaginosis)   . Hemorrhoid   . Hx of chlamydia infection   . Patellofemoral stress syndrome   . 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 Trichimoniasis   . Vaginal Pap smear, abnormal 09/22/14   ASCUS- +HPV  . Yeast infection      Past Surgical History:  Procedure Laterality Date  . COLONOSCOPY N/A 01/02/2020   Procedure: COLONOSCOPY;  Surgeon: West Bali, MD;  Location: [facility];  Service: Endoscopy;  Laterality: N/A;  11:00am  . HEMORRHOID SURGERY N/A 04/24/2018   Procedure: HEMORRHOIDECTOMY;  Surgeon: Karie Soda, MD;  Location: [facility] ORS;  Service: General;  Laterality: N/A;  . PEXY N/A 04/24/2018   Procedure: HEMORRHOIDAL LIGATION/PEXY;  Surgeon: Karie Soda, MD;  Location: [facility] ORS;  Service: General;  Laterality: N/A;  . RECTAL EXAM UNDER ANESTHESIA N/A 04/24/2018   Procedure: ANORECTAL EXAM UNDER ANESTHESIA;  Surgeon: Karie Soda, MD;  Location: [facility] ORS;  Service: General;  Laterality: N/A;   No Known Allergies Current Facility-Administered Medications on File Prior to Encounter  Medication Dose Route Frequency Provider Last Rate Last Admin  . lidocaine 2 % w/epi 1:200,000 (20 ml) with sod bicarb 8.4 % (2 ml) inj    Continuous PRN Orlie Pollen, CRNA   5 mL at 08/18/15 2327   Current Outpatient Medications on File Prior to Encounter  Medication Sig Dispense Refill  . medroxyPROGESTERone (DEPO-PROVERA) 150 MG/ML injection Inject 1 mL (150 mg total) into the muscle every 3 (three) months. 1 mL 0  . metroNIDAZOLE (METROGEL) 0.75 % vaginal gel INSERT 1 APPLICATORFUL IN VAGINA EVERY OTHER DAY AT BEDTIME. 70 g 0  . omeprazole (PRILOSEC) 20 MG capsule 1 PO 30 MINS PRIOR TO BREAKFAST. 90 capsule 3  . polyethylene glycol (MIRALAX / GLYCOLAX) 17 g packet Take 17 g  by mouth daily.     Social History   Socioeconomic History  . Marital status: Single    Spouse name: Not on file  . Number of children: Not on file  . Years of education: Not on file  . Highest education level: Not on file  Occupational History  . Not on file  Tobacco Use  . Smoking status: Never Smoker  . Smokeless tobacco: Never Used  Vaping Use  . Vaping Use: Never used  Substance and Sexual Activity  . Alcohol use: No  . Drug  use: No  . Sexual activity: Yes    Birth control/protection: Injection  Other Topics Concern  . Not on file  Social History Narrative  . Not on file   Social Determinants of Health   Financial Resource Strain:   . Difficulty of Paying Living Expenses:   Food Insecurity:   . Worried About Programme researcher, broadcasting/film/video in the Last Year:   . Barista in the Last Year:   Transportation Needs:   . Freight forwarder (Medical):   [name] Lack of Transportation (Non-Medical):   Physical Activity:   . Days of Exercise per Week:   . Minutes of Exercise per Session:   Stress:   . Feeling of Stress :   Social Connections:   . Frequency of Communication with Friends and Family:   . Frequency of Social Gatherings with Friends and Family:   . Attends Religious Services:   . Active Member of Clubs or Organizations:   . Attends Banker Meetings:   [name] Marital Status:   Intimate Partner Violence:   . Fear of Current or Ex-Partner:   . Emotionally Abused:   [name] Physically Abused:   . Sexually Abused:    Family History  Problem Relation Age of Onset  . Cancer Maternal Grandmother        breast  . Seizures Paternal Grandmother   . Colon cancer Neg Hx     OBJECTIVE:   There were no vitals filed for this visit.  General appearance: alert; no distress Eyes: EOMI grossly HENT: normocephalic; atraumatic Neck: supple with FROM Lungs: normal respiratory effort; speaking in full sentences without difficulty Extremities: moves extremities without difficulty Skin: No obvious rashes Neurologic: No facial asymmetries Psychological: alert and cooperative; normal mood and affect  ASSESSMENT & PLAN:  1. Flank pain   2. Lower abdominal pain   3. Dysuria   4. Urinary frequency     Meds ordered this encounter  Medications  . nitrofurantoin, macrocrystal-monohydrate, (MACROBID) 100 MG capsule    Sig: Take 1 capsule (100 mg total) by mouth 2 (two) times daily.    Dispense:  10  capsule    Refill:  0    Order Specific Question:   Supervising Provider    Answer:   Merrilee Jansky [id number]  . fluconazole (DIFLUCAN) 150 MG tablet    Sig: Take one tablet at the onset of symptoms, if still having symptoms in 3 days, take the second tablet.    Dispense:  2 tablet    Refill:  0    Order Specific Question:   Supervising Provider    Answer:   Merrilee Jansky [[id number]]     Push fluids and get rest Prescribed Macrobid Prescribed fluconazole in case of yeast May take OTC AZO Take OTC ibuprofen or tylenol as needed for pain Follow up with PCP if symptoms persists Return or go to ER if patient has  any new or worsening symptoms such as fever, chills, nausea, vomiting, worsening sore throat, cough, chest pain, changes in bowel or bladder habits  I discussed the assessment and treatment plan with the patient. The patient was provided an opportunity to ask questions and all were answered. The patient agreed with the plan and demonstrated an understanding of the instructions.   The patient was advised to call back or seek an in-person evaluation if the symptoms worsen or if the condition fails to improve as anticipated.  I provided 12 minutes of non-face-to-face time during this encounter.  Gail Lex, NP  05/20/2020 11:09 AM         Moshe Cipro, NP 05/20/20 1109

## 2020-06-02 DIAGNOSIS — K644 Residual hemorrhoidal skin tags: Secondary | ICD-10-CM | POA: Insufficient documentation

## 2020-06-08 ENCOUNTER — Ambulatory Visit (INDEPENDENT_AMBULATORY_CARE_PROVIDER_SITE_OTHER): Payer: BC Managed Care – PPO | Admitting: *Deleted

## 2020-06-08 ENCOUNTER — Ambulatory Visit: Payer: BC Managed Care – PPO

## 2020-06-08 DIAGNOSIS — Z3042 Encounter for surveillance of injectable contraceptive: Secondary | ICD-10-CM | POA: Diagnosis not present

## 2020-06-08 MED ORDER — MEDROXYPROGESTERONE ACETATE 150 MG/ML IM SUSP
150.0000 mg | Freq: Once | INTRAMUSCULAR | Status: AC
  Administered 2020-06-08: 150 mg via INTRAMUSCULAR

## 2020-06-08 NOTE — Progress Notes (Signed)
   NURSE VISIT- INJECTION  SUBJECTIVE:  Gail Fields is a 34 y.o. [phone] female here for a Depo Provera for contraception/period management. She is a GYN patient.   OBJECTIVE:  There were no vitals taken for this visit.  Appears well, in no apparent distress  Injection administered in: Left deltoid  Meds ordered this encounter  Medications  . medroxyPROGESTERone (DEPO-PROVERA) injection 150 mg    ASSESSMENT: GYN patient Depo Provera for contraception/period management PLAN: Follow-up: in 11-13 weeks for next Depo   Nance Pear  06/08/2020 3:55 PM

## 2020-08-31 ENCOUNTER — Ambulatory Visit: Payer: BC Managed Care – PPO

## 2020-08-31 ENCOUNTER — Other Ambulatory Visit: Payer: Self-pay | Admitting: Women's Health

## 2020-09-01 ENCOUNTER — Other Ambulatory Visit: Payer: Self-pay

## 2020-09-01 ENCOUNTER — Other Ambulatory Visit: Payer: Self-pay | Admitting: Adult Health

## 2020-09-01 ENCOUNTER — Telehealth: Payer: Self-pay | Admitting: *Deleted

## 2020-09-01 ENCOUNTER — Ambulatory Visit (INDEPENDENT_AMBULATORY_CARE_PROVIDER_SITE_OTHER): Payer: Medicaid Other | Admitting: *Deleted

## 2020-09-01 DIAGNOSIS — Z3042 Encounter for surveillance of injectable contraceptive: Secondary | ICD-10-CM

## 2020-09-01 MED ORDER — MEDROXYPROGESTERONE ACETATE 150 MG/ML IM SUSP: 150.0000 mg | INTRAMUSCULAR | 0 refills | Status: DC

## 2020-09-01 MED ORDER — MEDROXYPROGESTERONE ACETATE 150 MG/ML IM SUSP
150.0000 mg | Freq: Once | INTRAMUSCULAR | Status: AC
  Administered 2020-09-01: 150 mg via INTRAMUSCULAR

## 2020-09-01 NOTE — Telephone Encounter (Signed)
Jenn refilled Depo. JSY

## 2020-09-01 NOTE — Progress Notes (Signed)
° °  NURSE VISIT- INJECTION  SUBJECTIVE:  Gail Fields is a 34 y.o. [phone] female here for a Depo Provera for contraception/period management. She is a GYN patient.   OBJECTIVE:  There were no vitals taken for this visit.  Appears well, in no apparent distress  Injection administered in: Right deltoid  Meds ordered this encounter  Medications   medroxyPROGESTERone (DEPO-PROVERA) injection 150 mg    ASSESSMENT: GYN patient Depo Provera for contraception/period management PLAN: Follow-up: in 11-13 weeks for next Depo   Annamarie Dawley  09/01/2020 3:03 PM

## 2020-09-01 NOTE — Progress Notes (Signed)
Refilled depo 

## 2020-09-23 ENCOUNTER — Telehealth: Payer: Medicaid Other | Admitting: Orthopedic Surgery

## 2020-09-23 DIAGNOSIS — M545 Low back pain, unspecified: Secondary | ICD-10-CM | POA: Diagnosis not present

## 2020-09-23 MED ORDER — CYCLOBENZAPRINE HCL 10 MG PO TABS: 10.0000 mg | ORAL_TABLET | Freq: Three times a day (TID) | ORAL | 0 refills | Status: DC | PRN

## 2020-09-23 NOTE — Progress Notes (Signed)

## 2020-10-07 ENCOUNTER — Telehealth: Payer: Medicaid Other | Admitting: Orthopedic Surgery

## 2020-10-07 DIAGNOSIS — M546 Pain in thoracic spine: Secondary | ICD-10-CM | POA: Diagnosis not present

## 2020-10-07 MED ORDER — IBUPROFEN 600 MG PO TABS: 600.0000 mg | ORAL_TABLET | Freq: Three times a day (TID) | ORAL | 0 refills | Status: DC | PRN

## 2020-10-07 MED ORDER — CYCLOBENZAPRINE HCL 10 MG PO TABS: 10.0000 mg | ORAL_TABLET | Freq: Three times a day (TID) | ORAL | 0 refills | Status: DC | PRN

## 2020-10-07 NOTE — Progress Notes (Signed)
We are sorry that you are not feeling well.  Here is how we plan to help!  Based on what you have shared with me it looks like you mostly have acute back pain.  Acute back pain is defined as musculoskeletal pain that can resolve in 1-3 weeks with conservative treatment.  I have prescribed Ibuprofen 800mg  three times daily with food(a non-steroid anti-inflammatory (NSAID) as well as Flexeril 10 mg every eight hours as needed which is a muscle relaxer  Some patients experience stomach irritation or in increased heartburn with anti-inflammatory drugs.  Please keep in mind that muscle relaxer's can cause fatigue and should not be taken while at work or driving.  Back pain is very common.  The pain often gets better over time.  The cause of back pain is usually not dangerous.  Most people can learn to manage their back pain on their own.  Home Care  Stay active.  Start with short walks on flat ground if you can.  Try to walk farther each day.  Do not sit, drive or stand in one place for more than 30 minutes.  Do not stay in bed.  Do not avoid exercise or work.  Activity can help your back heal faster.  Be careful when you bend or lift an object.  Bend at your knees, keep the object close to you, and do not twist.  Sleep on a firm mattress.  Lie on your side, and bend your knees.  If you lie on your back, put a pillow under your knees.  Only take medicines as told by your doctor.  Put ice on the injured area.  Put ice in a plastic bag  Place a towel between your skin and the bag  Leave the ice on for 15-20 minutes, 3-4 times a day for the first 2-3 days. 210 After that, you can switch between ice and heat packs.  Ask your doctor about back exercises or massage.  Avoid feeling anxious or stressed.  Find good ways to deal with stress, such as exercise.  Get Help Right Way If:  Your pain does not go away with rest or medicine.  Your pain does not go away in 1 week.  You have new  problems.  You do not feel well.  The pain spreads into your legs.  You cannot control when you poop (bowel movement) or pee (urinate)  You feel sick to your stomach (nauseous) or throw up (vomit)  You have belly (abdominal) pain.  You feel like you may pass out (faint).  If you develop a fever.  Make Sure you:  Understand these instructions.  Will watch your condition  Will get help right away if you are not doing well or get worse.  Your e-visit answers were reviewed by a board certified advanced clinical practitioner to complete your personal care plan.  Depending on the condition, your plan could have included both over the counter or prescription medications.  If there is a problem please reply  once you have received a response from your provider.  Your safety is important to .  If you have drug allergies check your prescription carefully.    You can use MyChart to ask questions about today's visit, request a non-urgent call back, or ask for a work or school excuse for 24 hours related to this e-Visit. If it has been greater than 24 hours you will need to follow up with your provider, or enter a new e-Visit to  address those concerns.  You will get an e-mail in the next two days asking about your experience.  I hope that your e-visit has been valuable and will speed your recovery. Thank you for using e-visits.  Greater than 5 minutes, yet less than 10 minutes of time have been spent researching, coordinating and implementing care for this patient today.

## 2020-10-28 ENCOUNTER — Telehealth: Payer: Medicaid Other | Admitting: Physician Assistant

## 2020-10-28 DIAGNOSIS — J069 Acute upper respiratory infection, unspecified: Secondary | ICD-10-CM | POA: Diagnosis not present

## 2020-10-28 DIAGNOSIS — R059 Cough, unspecified: Secondary | ICD-10-CM

## 2020-10-28 MED ORDER — FLUTICASONE PROPIONATE 50 MCG/ACT NA SUSP: 2.0000 | Freq: Every day | NASAL | 6 refills | Status: DC

## 2020-10-28 MED ORDER — BENZONATATE 100 MG PO CAPS: 100.0000 mg | ORAL_CAPSULE | Freq: Three times a day (TID) | ORAL | 0 refills | Status: DC | PRN

## 2020-10-28 NOTE — Progress Notes (Signed)
We are sorry you are not feeling well.  Here is how we plan to help! ° °Based on what you have shared with me, it looks like you may have a viral upper respiratory infection.  Upper respiratory infections are caused by a large number of viruses; however, rhinovirus is the most common cause.  ° °Symptoms vary from person to person, with common symptoms including sore throat, cough, fatigue or lack of energy and feeling of general discomfort.  A low-grade fever of up to 100.4 may present, but is often uncommon.  Symptoms vary however, and are closely related to a person's age or underlying illnesses.  The most common symptoms associated with an upper respiratory infection are nasal discharge or congestion, cough, sneezing, headache and pressure in the ears and face.  These symptoms usually persist for about 3 to 10 days, but can last up to 2 weeks.  It is important to know that upper respiratory infections do not cause serious illness or complications in most cases.   ° °Upper respiratory infections can be transmitted from person to person, with the most common method of transmission being a person's hands.  The virus is able to live on the skin and can infect other persons for up to 2 hours after direct contact.  Also, these can be transmitted when someone coughs or sneezes; thus, it is important to cover the mouth to reduce this risk.  To keep the spread of the illness at bay, good hand hygiene is very important. ° °This is an infection that is most likely caused by a virus. There are no specific treatments other than to help you with the symptoms until the infection runs its course.  We are sorry you are not feeling well.  Here is how we plan to help! ° ° °For nasal congestion, you may use an oral decongestants such as Mucinex D or if you have glaucoma or high blood pressure use plain Mucinex.  Saline nasal spray or nasal drops can help and can safely be used as often as needed for congestion.  For your congestion,  I have prescribed Fluticasone nasal spray one spray in each nostril twice a day ° °If you do not have a history of heart disease, hypertension, diabetes or thyroid disease, prostate/bladder issues or glaucoma, you may also use Sudafed to treat nasal congestion.  It is highly recommended that you consult with a pharmacist or your primary care physician to ensure this medication is safe for you to take.    ° °If you have a cough, you may use cough suppressants such as Delsym and Robitussin.  If you have glaucoma or high blood pressure, you can also use Coricidin HBP.   °For cough I have prescribed for you A prescription cough medication called Tessalon Perles 100 mg. You may take 1-2 capsules every 8 hours as needed for cough ° °If you have a sore or scratchy throat, use a saltwater gargle- ¼ to ½ teaspoon of salt dissolved in a 4-ounce to 8-ounce glass of warm water.  Gargle the solution for approximately 15-30 seconds and then spit.  It is important not to swallow the solution.  You can also use throat lozenges/cough drops and Chloraseptic spray to help with throat pain or discomfort.  Warm or cold liquids can also be helpful in relieving throat pain. ° °For headache, pain or general discomfort, you can use Ibuprofen or Tylenol as directed.   °Some authorities believe that zinc sprays or the use of   Echinacea may shorten the course of your symptoms. ° ° °HOME CARE °• Only take medications as instructed by your medical team. °• Be sure to drink plenty of fluids. Water is fine as well as fruit juices, sodas and electrolyte beverages. You may want to stay away from caffeine or alcohol. If you are nauseated, try taking small sips of liquids. How do you know if you are getting enough fluid? Your urine should be a pale yellow or almost colorless. °• Get rest. °• Taking a steamy shower or using a humidifier may help nasal congestion and ease sore throat pain. You can place a towel over your head and breathe in the steam  from hot water coming from a faucet. °• Using a saline nasal spray works much the same way. °• Cough drops, hard candies and sore throat lozenges may ease your cough. °• Avoid close contacts especially the very young and the elderly °• Cover your mouth if you cough or sneeze °• Always remember to wash your hands.  ° °GET HELP RIGHT AWAY IF: °• You develop worsening fever. °• If your symptoms do not improve within 10 days °• You develop yellow or green discharge from your nose over 3 days. °• You have coughing fits °• You develop a severe head ache or visual changes. °• You develop shortness of breath, difficulty breathing or start having chest pain °• Your symptoms persist after you have completed your treatment plan ° °MAKE SURE YOU  °· Understand these instructions. °· Will watch your condition. °· Will get help right away if you are not doing well or get worse. ° °Your e-visit answers were reviewed by a board certified advanced clinical practitioner to complete your personal care plan. Depending upon the condition, your plan could have included both over the counter or prescription medications. °Please review your pharmacy choice. If there is a problem, you may call our nursing hot line at and have the prescription routed to another pharmacy. °Your safety is important to us. If you have drug allergies check your prescription carefully.  ° °You can use MyChart to ask questions about today’s visit, request a non-urgent call back, or ask for a work or school excuse for 24 hours related to this e-Visit. If it has been greater than 24 hours you will need to follow up with your provider, or enter a new e-Visit to address those concerns. °You will get an e-mail in the next two days asking about your experience.  I hope that your e-visit has been valuable and will speed your recovery. Thank you for using e-visits. ° ° ° ° °Greater than 5 minutes, yet less than 10 minutes of time have been spent researching, coordinating  and implementing care for this patient today.  ° °

## 2020-11-23 ENCOUNTER — Ambulatory Visit: Payer: Medicaid Other

## 2020-11-24 ENCOUNTER — Telehealth: Payer: Self-pay | Admitting: Advanced Practice Midwife

## 2020-11-24 ENCOUNTER — Other Ambulatory Visit: Payer: Self-pay | Admitting: Women's Health

## 2020-11-24 ENCOUNTER — Telehealth: Payer: Medicaid Other | Admitting: Family

## 2020-11-24 DIAGNOSIS — B3731 Acute candidiasis of vulva and vagina: Secondary | ICD-10-CM

## 2020-11-24 DIAGNOSIS — B373 Candidiasis of vulva and vagina: Secondary | ICD-10-CM

## 2020-11-24 DIAGNOSIS — R399 Unspecified symptoms and signs involving the genitourinary system: Secondary | ICD-10-CM

## 2020-11-24 MED ORDER — CEPHALEXIN 500 MG PO CAPS: 500.0000 mg | ORAL_CAPSULE | Freq: Two times a day (BID) | ORAL | 0 refills | Status: DC

## 2020-11-24 MED ORDER — FLUCONAZOLE 150 MG PO TABS: 150.0000 mg | ORAL_TABLET | ORAL | 0 refills | Status: DC | PRN

## 2020-11-24 MED ORDER — MEDROXYPROGESTERONE ACETATE 150 MG/ML IM SUSP: 150.0000 mg | INTRAMUSCULAR | 0 refills | Status: DC

## 2020-11-24 NOTE — Progress Notes (Signed)

## 2020-11-24 NOTE — Progress Notes (Signed)

## 2020-11-24 NOTE — Telephone Encounter (Signed)
Patient wants to know if her Depo Injection could be sent over to [facility] in [place] on [address]. Patient has a Nurse visit appt scheduled on 11/25/2020(DEPO Injection) and PAP/Physical scheduled on 12/08/2020. Per Patient. Clinical staff will follow up with patient.

## 2020-11-25 ENCOUNTER — Ambulatory Visit (INDEPENDENT_AMBULATORY_CARE_PROVIDER_SITE_OTHER): Payer: Medicaid Other

## 2020-11-25 ENCOUNTER — Other Ambulatory Visit: Payer: Self-pay

## 2020-11-25 DIAGNOSIS — Z3042 Encounter for surveillance of injectable contraceptive: Secondary | ICD-10-CM

## 2020-11-25 MED ORDER — MEDROXYPROGESTERONE ACETATE 150 MG/ML IM SUSP
150.0000 mg | Freq: Once | INTRAMUSCULAR | Status: AC
  Administered 2020-11-25: 150 mg via INTRAMUSCULAR

## 2020-11-25 NOTE — Progress Notes (Signed)
   NURSE VISIT- INJECTION  SUBJECTIVE:  Gail Fields is a 35 y.o. [phone] female here for a Depo Provera for contraception/period management. She is a GYN patient.   OBJECTIVE:  There were no vitals taken for this visit.  Appears well, in no apparent distress  Injection administered in: Left deltoid  Meds ordered this encounter  Medications  . medroxyPROGESTERone (DEPO-PROVERA) injection 150 mg    ASSESSMENT: GYN patient Depo Provera for contraception/period management PLAN: Follow-up: in 11-13 weeks for next Depo   Jayshon Dommer A Fitz Matsuo  11/25/2020 4:08 PM

## 2020-12-06 ENCOUNTER — Telehealth: Payer: Medicaid Other | Admitting: Physician Assistant

## 2020-12-06 DIAGNOSIS — J310 Chronic rhinitis: Secondary | ICD-10-CM

## 2020-12-06 MED ORDER — MOMETASONE FUROATE 50 MCG/ACT NA SUSP: 2.0000 | Freq: Every day | NASAL | 0 refills | Status: DC

## 2020-12-06 MED ORDER — FEXOFENADINE HCL 60 MG PO TABS: 60.0000 mg | ORAL_TABLET | Freq: Two times a day (BID) | ORAL | 0 refills | Status: DC

## 2020-12-06 NOTE — Progress Notes (Signed)
I have spent 5 minutes in review of e-visit questionnaire, review and updating patient chart, medical decision making and response to patient.   Sherif Millspaugh Cody Shaniquia Brafford, PA-C    

## 2020-12-06 NOTE — Addendum Note (Signed)
Addended by: Waldon Merl on: 12/06/2020 10:47 AM   Modules accepted: Orders

## 2020-12-06 NOTE — Progress Notes (Signed)
E visit for Allergic Rhinitis We are sorry that you are not feeling well.  Here is how we plan to help!  Based on what you have shared with me it looks like you have Allergic Rhinitis.  Rhinitis is when a reaction occurs that causes nasal congestion, runny nose, sneezing, and itching.  Most types of rhinitis are caused by an inflammation and are associated with symptoms in the eyes ears or throat. There are several types of rhinitis.  The most common are acute rhinitis, which is usually caused by a viral illness, allergic or seasonal rhinitis, and nonallergic or year-round rhinitis.  Nasal allergies occur certain times of the year.  Allergic rhinitis is caused when allergens in the air trigger the release of histamine in the body.  Histamine causes itching, swelling, and fluid to build up in the fragile linings of the nasal passages, sinuses and eyelids.  An itchy nose and clear discharge are common.  I recommend the following over the counter treatments: You should take a daily dose of antihistamine and Allegra 60 mg twice daily. Switch to this from Claritin as it is more potent and will hopefully work better for you.   I also would recommend a nasal spray: Nasonex 2 sprays into each nostril once daily and Saline 1 spray into each nostril as needed  You may also benefit from eye drops such as: Systane 1-2 driops each eye twice daily as needed   If symptoms are still significant would recommend you talk with your primary care provider to discuss other prescription treatments like Singulair versus assessment with an allergist.   HOME CARE:   You can use an over-the-counter saline nasal spray as needed  Avoid areas where there is heavy dust, mites, or molds  Stay indoors on windy days during the pollen season  Keep windows closed in home, at least in bedroom; use air conditioner.  Use high-efficiency house air filter  Keep windows closed in car, turn AC on re-circulate  Avoid playing  out with dog during pollen season  GET HELP RIGHT AWAY IF:   If your symptoms do not improve within 10 days  You become short of breath  You develop yellow or green discharge from your nose for over 3 days  You have coughing fits  MAKE SURE YOU:   Understand these instructions  Will watch your condition  Will get help right away if you are not doing well or get worse  Thank you for choosing an e-visit. Your e-visit answers were reviewed by a board certified advanced clinical practitioner to complete your personal care plan. Depending upon the condition, your plan could have included both over the counter or prescription medications. Please review your pharmacy choice. Be sure that the pharmacy you have chosen is open so that you can pick up your prescription now.  If there is a problem you may message your provider in MyChart to have the prescription routed to another pharmacy. Your safety is important to [place]. If you have drug allergies check your prescription carefully.  For the next 24 hours, you can use MyChart to ask questions about today's visit, request a non-urgent call back, or ask for a work or school excuse from your e-visit provider. You will get an email in the next two days asking about your experience. I hope that your e-visit has been valuable and will speed your recovery.

## 2020-12-08 ENCOUNTER — Other Ambulatory Visit (HOSPITAL_COMMUNITY)
Admission: RE | Admit: 2020-12-08 | Discharge: 2020-12-08 | Disposition: A | Discharge status: Home / Self Care | Payer: Medicaid Other | Source: Ambulatory Visit | Attending: Advanced Practice Midwife | Admitting: Advanced Practice Midwife

## 2020-12-08 ENCOUNTER — Encounter: Payer: Self-pay | Admitting: Advanced Practice Midwife

## 2020-12-08 ENCOUNTER — Ambulatory Visit (INDEPENDENT_AMBULATORY_CARE_PROVIDER_SITE_OTHER): Payer: Medicaid Other | Admitting: Advanced Practice Midwife

## 2020-12-08 ENCOUNTER — Other Ambulatory Visit: Payer: Self-pay

## 2020-12-08 VITALS — BP 122/72 | HR 91 | Ht 66.0 in | Wt 180.0 lb

## 2020-12-08 DIAGNOSIS — R87610 Atypical squamous cells of undetermined significance on cytologic smear of cervix (ASC-US): Secondary | ICD-10-CM

## 2020-12-08 DIAGNOSIS — N3001 Acute cystitis with hematuria: Secondary | ICD-10-CM | POA: Diagnosis not present

## 2020-12-08 DIAGNOSIS — Z01419 Encounter for gynecological examination (general) (routine) without abnormal findings: Secondary | ICD-10-CM | POA: Insufficient documentation

## 2020-12-08 DIAGNOSIS — N390 Urinary tract infection, site not specified: Secondary | ICD-10-CM | POA: Insufficient documentation

## 2020-12-08 DIAGNOSIS — R300 Dysuria: Secondary | ICD-10-CM | POA: Diagnosis not present

## 2020-12-08 DIAGNOSIS — R87810 Cervical high risk human papillomavirus (HPV) DNA test positive: Secondary | ICD-10-CM

## 2020-12-08 LAB — POCT URINALYSIS DIPSTICK OB
Glucose, UA: NEGATIVE
Ketones, UA: NEGATIVE
Nitrite, UA: POSITIVE
POC,PROTEIN,UA: NEGATIVE

## 2020-12-08 MED ORDER — FEXOFENADINE-PSEUDOEPHED ER 60-120 MG PO TB12: 1.0000 | ORAL_TABLET | Freq: Two times a day (BID) | ORAL | 3 refills | Status: DC

## 2020-12-08 MED ORDER — MEDROXYPROGESTERONE ACETATE 150 MG/ML IM SUSP: 150.0000 mg | INTRAMUSCULAR | 5 refills | Status: DC

## 2020-12-08 MED ORDER — NITROFURANTOIN MONOHYD MACRO 100 MG PO CAPS
100.0000 mg | ORAL_CAPSULE | Freq: Two times a day (BID) | ORAL | 0 refills | Status: DC
Start: 2020-12-08 — End: 2021-03-29

## 2020-12-08 MED ORDER — FLUCONAZOLE 150 MG PO TABS: 150.0000 mg | ORAL_TABLET | ORAL | 0 refills | Status: DC

## 2020-12-08 NOTE — Patient Instructions (Signed)
Urinary Tract Infection, Adult A urinary tract infection (UTI) is an infection of any part of the urinary tract. The urinary tract includes:  The kidneys.  The ureters.  The bladder.  The urethra. These organs make, store, and get rid of pee (urine) in the body. What are the causes? This infection is caused by germs (bacteria) in your genital area. These germs grow and cause swelling (inflammation) of your urinary tract. What increases the risk? The following factors may make you more likely to develop this condition:  Using a small, thin tube (catheter) to drain pee.  Not being able to control when you pee or poop (incontinence).  Being female. If you are female, these things can increase the risk: ? Using these methods to prevent pregnancy:  A medicine that kills sperm (spermicide).  A device that blocks sperm (diaphragm). ? Having low levels of a female hormone (estrogen). ? Being pregnant. You are more likely to develop this condition if:  You have genes that add to your risk.  You are sexually active.  You take antibiotic medicines.  You have trouble peeing because of: ? A prostate that is bigger than normal, if you are female. ? A blockage in the part of your body that drains pee from the bladder. ? A kidney stone. ? A nerve condition that affects your bladder. ? Not getting enough to drink. ? Not peeing often enough.  You have other conditions, such as: ? Diabetes. ? A weak disease-fighting system (immune system). ? Sickle cell disease. ? Gout. ? Injury of the spine. What are the signs or symptoms? Symptoms of this condition include:  Needing to pee right away.  Peeing small amounts often.  Pain or burning when peeing.  Blood in the pee.  Pee that smells bad or not like normal.  Trouble peeing.  Pee that is cloudy.  Fluid coming from the vagina, if you are female.  Pain in the belly or lower back. Other symptoms include:  Vomiting.  Not  feeling hungry.  Feeling mixed up (confused). This may be the first symptom in older adults.  Being tired and grouchy (irritable).  A fever.  Watery poop (diarrhea). How is this treated?  Taking antibiotic medicine.  Taking other medicines.  Drinking enough water. In some cases, you may need to see a specialist. Follow these instructions at home: Medicines  Take over-the-counter and prescription medicines only as told by your doctor.  If you were prescribed an antibiotic medicine, take it as told by your doctor. Do not stop taking it even if you start to feel better. General instructions  Make sure you: ? Pee until your bladder is empty. ? Do not hold pee for a long time. ? Empty your bladder after sex. ? Wipe from front to back after peeing or pooping if you are a female. Use each tissue one time when you wipe.  Drink enough fluid to keep your pee pale yellow.  Keep all follow-up visits.   Contact a doctor if:  You do not get better after 1-2 days.  Your symptoms go away and then come back. Get help right away if:  You have very bad back pain.  You have very bad pain in your lower belly.  You have a fever.  You have chills.  You feeling like you will vomit or you vomit. Summary  A urinary tract infection (UTI) is an infection of any part of the urinary tract.  This condition is caused by   germs in your genital area.  There are many risk factors for a UTI.  Treatment includes antibiotic medicines.  Drink enough fluid to keep your pee pale yellow. This information is not intended to replace advice given to you by your health care provider. Make sure you discuss any questions you have with your health care provider. Document Revised: 06/04/2020 Document Reviewed: 06/04/2020 Elsevier Patient Education  2021 Elsevier Inc.  

## 2020-12-08 NOTE — Progress Notes (Signed)
WELL-WOMAN EXAMINATION Patient name: Gail Fields MRN [id number]  Date of birth: Aug 11, 1986 Chief Complaint:   Gynecologic Exam  History of Present Illness:   Gail Fields is a 35 y.o. [phone] African American female being seen today for a routine well-woman exam.  Current complaints: has noticed dysuria/frequency approx 2-3d ago; no fever/back pain; also having some vag itching; had hemorrhoidectomy in 2019 but feels a 'tag' outside of anus that burns when secretions from IC touch it; has post-nasal drip symptoms as well  Depression screen [facility] 2/9 12/08/2020 07/30/2019 04/17/2017 11/20/2016  Decreased Interest 0 0 0 0  Down, Depressed, Hopeless 0 0 0 0  PHQ - 2 Score 0 0 0 0  Altered sleeping 0 - 0 -  Tired, decreased energy 1 - 1 -  Change in appetite 0 - 0 -  Feeling bad or failure about yourself  0 - 0 -  Trouble concentrating 0 - 0 -  Moving slowly or fidgety/restless 0 - - -  Suicidal thoughts 0 - 0 -  PHQ-9 Score 1 - 1 -     No LMP recorded. Patient has had an injection. The current method of family planning is Depo-Provera injections.  Last pap Sept 2020. Results were: ASCUS w/ HRHPV positive. H/O abnormal pap: yes ; had colpo 09/2019 with low grade cx abnormalities and needs repeat in 6yr Last mammogram: never. Results were: N/A. Family h/o breast cancer: no Last colonoscopy: never. Results were: N/A. Family h/o colorectal cancer: no Review of Systems:   Pertinent items are noted in HPI Denies any headaches, blurred vision, fatigue, shortness of breath, chest pain, abdominal pain, abnormal vaginal discharge/itching/odor/irritation, problems with periods, bowel movements, urination, or intercourse unless otherwise stated above. Pertinent History Reviewed:  Reviewed past medical,surgical, social and family history.  Reviewed problem list, medications and allergies. Physical Assessment:   Vitals:   12/08/20 1459  BP: 122/72  Pulse: 91  Weight: 180 lb (81.6 kg)   Height: 5\' 6"  (1.676 m)  Body mass index is 29.05 kg/m.        Physical Examination:   General appearance - well appearing, and in no distress  Mental status - alert, oriented to person, place, and time  Psych:  She has a normal mood and affect  Skin - warm and dry, normal color, no suspicious lesions noted  Chest - effort normal, all lung fields clear to auscultation bilaterally  Heart - normal rate and regular rhythm  Neck:  midline trachea, no thyromegaly or nodules  Breasts - breasts appear normal, no suspicious masses, no skin or nipple changes or  axillary nodes  Abdomen - soft, nontender, nondistended, no masses or organomegaly  Pelvic - VULVA: normal appearing vulva with no masses, tenderness or lesions  VAGINA: normal appearing vagina with normal color and discharge, no lesions  CERVIX: normal appearing cervix without discharge or lesions, no CMT  Thin prep pap is done with HR HPV cotesting  UTERUS: uterus is felt to be normal size, shape, consistency and nontender   ADNEXA: No adnexal masses or tenderness noted.  Rectal -  sm anal skin tag  Extremities:  No swelling or varicosities noted  Chaperone:    Results for orders placed or performed in visit on 12/08/20 (from the past 24 hour(s))  POC Urinalysis Dipstick OB   Collection Time: 12/08/20  3:07 PM  Result Value Ref Range   Color, UA     Clarity, UA     Glucose,  UA Negative Negative   Bilirubin, UA     Ketones, UA neg    Spec Grav, UA     Blood, UA trace    pH, UA     POC,PROTEIN,UA Negative Negative, Trace, Small (1+), Moderate (2+), Large (3+), 4+   Urobilinogen, UA     Nitrite, UA positive    Leukocytes, UA Trace (A) Negative   Appearance     Odor      Assessment & Plan:  1) Well-Woman Exam  2) Probable UTI, rx Macrobid and urine to culture  3) Yeast, rx Diflucan #3 to use weekly x 3 since she will be on an antibiotic  4) Post-nasal drip, rx Allegra  5) On DMPA, refilled x 44yr   6)  Anal skin tag, is trying to get in with a new female GI doc for eval; recommend to try to keep it as dry as possible for now to decrease burning  Labs/procedures today: Pap  Mammogram age 13 or sooner if problems Colonoscopy age 58 or sooner if problems  Orders Placed This Encounter  Procedures  . Urine Culture  . POC Urinalysis Dipstick OB    Meds:  Meds ordered this encounter  Medications  . nitrofurantoin, macrocrystal-monohydrate, (MACROBID) 100 MG capsule    Sig: Take 1 capsule (100 mg total) by mouth 2 (two) times daily.    Dispense:  14 capsule    Refill:  0    Order Specific Question:   Supervising Provider    Answer:   Despina Hidden, LUTHER H [[id number]]  . fluconazole (DIFLUCAN) 150 MG tablet    Sig: Take 1 tablet (150 mg total) by mouth once a week.    Dispense:  3 tablet    Refill:  0    Order Specific Question:   Supervising Provider    Answer:   EURE, LUTHER H [[id number]]  . fexofenadine-pseudoephedrine (ALLEGRA-D ALLERGY & CONGESTION) 60-120 MG 12 hr tablet    Sig: Take 1 tablet by mouth 2 (two) times daily.    Dispense:  30 tablet    Refill:  3    Order Specific Question:   Supervising Provider    Answer:   Despina Hidden, LUTHER H [[id number]]  . DISCONTD: medroxyPROGESTERone (DEPO-PROVERA) 150 MG/ML injection    Sig: Inject 1 mL (150 mg total) into the muscle every 3 (three) months.    Dispense:  1 mL    Refill:  5    Order Specific Question:   Supervising Provider    Answer:   Despina Hidden, LUTHER H [[id number]]  . medroxyPROGESTERone (DEPO-PROVERA) 150 MG/ML injection    Sig: Inject 1 mL (150 mg total) into the muscle every 3 (three) months.    Dispense:  1 mL    Refill:  5    Order Specific Question:   Supervising Provider    Answer:   Lazaro Arms [[id number]]    Follow-up: Return in about 1 year (around 12/08/2021) for Physical.  Arabella Merles CNM 12/08/2020 4:50 PM

## 2020-12-12 LAB — URINE CULTURE

## 2020-12-15 ENCOUNTER — Encounter: Payer: Self-pay | Admitting: Advanced Practice Midwife

## 2020-12-15 LAB — CYTOLOGY - PAP
Chlamydia: NEGATIVE
Comment: NEGATIVE
Comment: NEGATIVE
Comment: NEGATIVE
Comment: NORMAL
Diagnosis: HIGH — AB
HPV 16: NEGATIVE
HPV 18 / 45: NEGATIVE
High risk HPV: POSITIVE — AB
Neisseria Gonorrhea: NEGATIVE

## 2021-01-04 ENCOUNTER — Encounter: Payer: Medicaid Other | Admitting: Women's Health

## 2021-02-17 ENCOUNTER — Ambulatory Visit: Payer: Medicaid Other

## 2021-02-17 ENCOUNTER — Encounter: Payer: Medicaid Other | Admitting: Women's Health

## 2021-03-14 ENCOUNTER — Encounter: Payer: Medicaid Other | Admitting: Women's Health

## 2021-03-15 ENCOUNTER — Encounter: Payer: Medicaid Other | Admitting: Women's Health

## 2021-03-29 ENCOUNTER — Other Ambulatory Visit (HOSPITAL_COMMUNITY)
Admission: RE | Admit: 2021-03-29 | Discharge: 2021-03-29 | Disposition: A | Discharge status: Home / Self Care | Payer: Medicaid Other | Source: Ambulatory Visit | Attending: Obstetrics & Gynecology | Admitting: Obstetrics & Gynecology

## 2021-03-29 ENCOUNTER — Ambulatory Visit (INDEPENDENT_AMBULATORY_CARE_PROVIDER_SITE_OTHER): Payer: Medicaid Other | Admitting: Women's Health

## 2021-03-29 ENCOUNTER — Other Ambulatory Visit: Payer: Self-pay

## 2021-03-29 ENCOUNTER — Encounter: Payer: Self-pay | Admitting: Women's Health

## 2021-03-29 VITALS — BP 104/64 | HR 65 | Wt 185.0 lb

## 2021-03-29 DIAGNOSIS — R87611 Atypical squamous cells cannot exclude high grade squamous intraepithelial lesion on cytologic smear of cervix (ASC-H): Secondary | ICD-10-CM | POA: Diagnosis present

## 2021-03-29 NOTE — Patient Instructions (Signed)
[email or web address]  Colposcopy, Care After This sheet gives you information about how to care for yourself after your procedure. Your health care provider may also give you more specific instructions. If you have problems or questions, contact your health care provider. What can I expect after the procedure? If you had a colposcopy without a biopsy, you can expect to feel fine right away after your procedure. However, you may have some spotting of blood for a few days. You can return to your normal activities. If you had a colposcopy with a biopsy, it is common after the procedure to have:  Soreness and mild pain. These may last for a few days.  Light-headedness.  Mild vaginal bleeding or discharge that is dark-colored and grainy. This may last for a few days. The discharge may be caused by a liquid (solution) that was used during the procedure. You may need to wear a sanitary pad during this time.  Spotting of blood for at least 48 hours after the procedure. Follow these instructions at home: Medicines  Take over-the-counter and prescription medicines only as told by your health care provider.  Talk with your health care provider about what type of over-the-counter pain medicine and prescription medicine you can start to take again. It is especially important to talk with your health care provider if you take blood thinners. Activity  Limit your physical activity for the first day after your procedure as told by your health care provider.  Avoid using douche products, using tampons, or having sex for at least 3 days after the procedure or for as long as told.  Return to your normal activities as told by your health care provider. Ask your health care provider what activities are safe for you. General instructions  Drink enough fluid to keep your urine pale yellow.  Ask your health care provider if you may take baths, swim, or use a hot tub. You may take  showers.  If you use birth control (contraception), continue to use it.  Keep all follow-up visits as told by your health care provider. This is important.   Contact a health care provider if:  You develop a skin rash. Get help right away if:  You bleed a lot from your vagina or pass blood clots. This includes using more than one sanitary pad each hour for 2 hours in a row.  You have a fever or chills.  You have vaginal discharge that is abnormal, is yellow in color, or smells bad. This could be a sign of infection.  You have severe pain or cramps in your lower abdomen that do not go away with medicine.  You faint. Summary  If you had a colposcopy without a biopsy, you can expect to feel fine right away, but you may have some spotting of blood for a few days. You can return to your normal activities.  If you had a colposcopy with a biopsy, it is common to have mild pain for a few days and spotting for 48 hours after the procedure.  Avoid using douche products, using tampons, and having sex for at least 3 days after the procedure or for as long as told by your health care provider.  Get help right away if you have heavy bleeding, severe pain, or signs of infection. This information is not intended to replace advice given to you by your health care provider. Make sure you discuss any questions you have with your health care provider. Document Revised: 10/22/2019 Document Reviewed:   10/22/2019 Elsevier Patient Education  2021 Elsevier Inc.  

## 2021-03-29 NOTE — Addendum Note (Signed)
Addended by: Moss Mc on: 03/29/2021 04:53 PM   Modules accepted: Orders

## 2021-03-29 NOTE — Progress Notes (Addendum)
   COLPOSCOPY PROCEDURE NOTE Patient name: Gail Fields MRN [id number]  Date of birth: [birth year] Subjective Findings:   Gail Fields is a 35 y.o. [id number] African American female being seen today for a colposcopy. Indication: Abnormal pap on 12/08/20: ASC-H w/ HRHPV positive: other (not 16, 18/45)  Prior cytology:  Date Result Procedure  07/30/19 ASCUS w/ HRHPV positive: other (not 16, 18/45) Colposcopy: w/ bx: CIN-I  2018 NILM w/ HRHPV positive: other (not 16, 18/45) None          No LMP recorded. Patient has had an injection. Contraception: Depo-Provera injections. Menopausal: no. Hysterectomy: no.   Smoker: no. Immunocompromised: no.   The risks and benefits were explained and informed consent was obtained, and written copy is in chart. Pertinent History Reviewed:   Reviewed past medical,surgical, social, obstetrical and family history.  Reviewed problem list, medications and allergies. Objective Findings & Procedure:   Vitals:   03/29/21 1514  BP: 104/64  Pulse: 65  Weight: 185 lb (83.9 kg)  Body mass index is 29.86 kg/m.  No results found for this or any previous visit (from the past 24 hour(s)).   Time out was performed.  Speculum placed in the vagina, cervix fully visualized. SCJ: fully visualized. Cervix swabbed x 3 with acetic acid.  Acetowhitening present: Yes Cervix: no mosaicism, no punctation, no abnormal vasculature and acetowhite lesion(s) noted at 11 and 6 o'clock. Cervical biopsies taken at 11 and 6 o'clock; hemostasis achieved w/ Monsels solution. Vagina: vaginal colposcopy not performed Vulva: vulvar colposcopy not performed  Specimens: 2  Complications: none  Colposcopic Impression & Plan:   Colposcopy findings consistent with LSIL Plan: Post biopsy instructions given, Will notify patient of results when back and Will base plan of care on pathology results and ASCCP guidelines  Return in about 1 year (around 03/29/2022) for Pap &  physical.  Cheral Marker CNM, [facility] 03/29/2021 3:47 PM    Attestation of Attending Supervision of Nursing Visit Encounter: Evaluation and management procedures were performed by the nursing staff under my supervision and collaboration.  I have reviewed the nurse's note and chart, and I agree with the management and plan.  Rockne Coons MD Attending Physician for the Center for [facility] 03/31/2021 9:59 PM

## 2021-03-31 LAB — SURGICAL PATHOLOGY

## 2021-05-02 ENCOUNTER — Telehealth: Payer: Medicaid Other | Admitting: Physician Assistant

## 2021-05-02 DIAGNOSIS — L255 Unspecified contact dermatitis due to plants, except food: Secondary | ICD-10-CM | POA: Diagnosis not present

## 2021-05-02 MED ORDER — PREDNISONE 10 MG PO TABS: ORAL_TABLET | ORAL | 0 refills | Status: DC

## 2021-05-02 NOTE — Progress Notes (Signed)
E-Visit for Poison Ivy  We are sorry that you are not feeing well.  Here is how we plan to help!  Based on what you have shared with me it looks like you have had an allergic reaction to the oily resin from a group of plants.  This resin is very sticky, so it easily attaches to your skin, clothing, tools equipment, and pet's fur.    This blistering rash is often called poison ivy rash although it can come from contact with the leaves, stems and roots of poison ivy, poison oak and poison sumac.  The oily resin contains urushiol (u-ROO-she-ol) that produces a skin rash on exposed skin.  The severity of the rash depends on the amount of urushiol that gets on your skin.  A section of skin with more urushiol on it may develop a rash sooner.  The rash usually develops 12-48 hours after exposure and can last two to three weeks.  Your skin must come in direct contact with the plant's oil to be affected.  Blister fluid doesn't spread the rash.  However, if you come into contact with a piece of clothing or pet fur that has urushiol on it, the rash may spread out.  You can also transfer the oil to other parts of your body with your fingers.  Often the rash looks like a straight line because of the way the plant brushes against your skin.  Since your rash is widespread or has resulted in a large number of blisters, I have prescribed an oral corticosteroid.  Please follow these recommendations:  I have sent a prednisone dose pack to your chosen pharmacy. Be sure to follow the instructions carefully and complete the entire prescription. You may use Benadryl or Caladryl topical lotions to sooth the itch and remember cool, not hot, showers and baths can help relieve the itching!  Place cool, wet compresses on the affected area for 15-30 minutes several times a day.  You may also take oral antihistamines, such as diphenhydramine (Benadryl, others), which may also help you sleep better.  Watch your skin for any purulent  (pus) drainage or red streaking from the site.  If this occurs, contact your provider.  You may require an antibiotic for a skin infection.  Make sure that the clothes you were wearing as well as any towels or sheets that may have come in contact with the oil (urushiol) are washed in detergent and hot water.       I have developed the following plan to treat your condition I am prescribing a two week course of steroids (37 tablets of 10 mg prednisone).  Days 1-4 take 4 tablets (40 mg) daily  Days 5-8 take 3 tablets (30 mg) daily, Days 9-11 take 2 tablets (20 mg) daily, Days 12-14 take 1 tablet (10 mg) daily.    What can you do to prevent this rash?  Avoid the plants.  Learn how to identify poison ivy, poison oak and poison sumac in all seasons.  When hiking or engaging in other activities that might expose you to these plants, try to stay on cleared pathways.  If camping, make sure you pitch your tent in an area free of these plants.  Keep pets from running through wooded areas so that urushiol doesn't accidentally stick to their fur, which you may touch.  Remove or kill the plants.  In your yard, you can get rid of poison ivy by applying an herbicide or pulling it out of   the ground, including the roots, while wearing heavy gloves.  Afterward remove the gloves and thoroughly wash them and your hands.  Don't burn poison ivy or related plants because the urushiol can be carried by smoke.  Wear protective clothing.  If needed, protect your skin by wearing socks, boots, pants, long sleeves and vinyl gloves.  Wash your skin right away.  Washing off the oil with soap and water within 30 minutes of exposure may reduce your chances of getting a poison ivy rash.  Even washing after an hour or so can help reduce the severity of the rash.  If you walk through some poison ivy and then later touch your shoes, you may get some urushiol on your hands, which may then transfer to your face or body by touching or  rubbing.  If the contaminated object isn't cleaned, the urushiol on it can still cause a skin reaction years later.    Be careful not to reuse towels after you have washed your skin.  Also carefully wash clothing in detergent and hot water to remove all traces of the oil.  Handle contaminated clothing carefully so you don't transfer the urushiol to yourself, furniture, rugs or appliances.  Remember that pets can carry the oil on their fur and paws.  If you think your pet may be contaminated with urushiol, put on some long rubber gloves and give your pet a bath.  Finally, be careful not to burn these plants as the smoke can contain traces of the oil.  Inhaling the smoke may result in difficulty breathing. If that occurred you should see a physician as soon as possible.  See your doctor right away if:  The reaction is severe or widespread You inhaled the smoke from burning poison ivy and are having difficulty breathing Your skin continues to swell The rash affects your eyes, mouth or genitals Blisters are oozing pus You develop a fever greater than 100 F (37.8 C) The rash doesn't get better within a few weeks.  If you scratch the poison ivy rash, bacteria under your fingernails may cause the skin to become infected.  See your doctor if pus starts oozing from the blisters.  Treatment generally includes antibiotics.  Poison ivy treatments are usually limited to self-care methods.  And the rash typically goes away on its own in two to three weeks.     If the rash is widespread or results in a large number of blisters, your doctor may prescribe an oral corticosteroid, such as prednisone.  If a bacterial infection has developed at the rash site, your doctor may give you a prescription for an oral antibiotic.  MAKE SURE YOU  Understand these instructions. Will watch your condition. Will get help right away if you are not doing well or get worse.   Thank you for choosing an e-visit.  Your  e-visit answers were reviewed by a board certified advanced clinical practitioner to complete your personal care plan. Depending upon the condition, your plan could have included both over the counter or prescription medications.  Please review your pharmacy choice. Make sure the pharmacy is open so you can pick up prescription now. If there is a problem, you may contact your provider through MyChart messaging and have the prescription routed to another pharmacy.  Your safety is important to us. If you have drug allergies check your prescription carefully.   For the next 24 hours you can use MyChart to ask questions about today's visit, request a non-urgent   call back, or ask for a work or school excuse. You will get an email in the next two days asking about your experience. I hope that your e-visit has been valuable and will speed your recovery.   I provided 5 minutes of non face-to-face time during this encounter for chart review and documentation.   

## 2021-05-11 ENCOUNTER — Ambulatory Visit (INDEPENDENT_AMBULATORY_CARE_PROVIDER_SITE_OTHER): Payer: Medicaid Other

## 2021-05-11 ENCOUNTER — Other Ambulatory Visit: Payer: Self-pay

## 2021-05-11 DIAGNOSIS — Z3042 Encounter for surveillance of injectable contraceptive: Secondary | ICD-10-CM | POA: Diagnosis not present

## 2021-05-11 MED ORDER — MEDROXYPROGESTERONE ACETATE 150 MG/ML IM SUSP
150.0000 mg | Freq: Once | INTRAMUSCULAR | Status: AC
  Administered 2021-05-11: 150 mg via INTRAMUSCULAR

## 2021-05-11 NOTE — Progress Notes (Signed)
   NURSE VISIT- INJECTION  SUBJECTIVE:  Gail Fields is a 35 y.o. [phone] female here for a Depo Provera for contraception/period management. She is a GYN patient.  Pt's last Depo injection was given at [facility] on 02/15/21 in the left deltoid. Pt to have record faxed to this office. OBJECTIVE:  There were no vitals taken for this visit.  Appears well, in no apparent distress  Injection administered in: Right deltoid  Meds ordered this encounter  Medications   medroxyPROGESTERone (DEPO-PROVERA) injection 150 mg    ASSESSMENT: GYN patient Depo Provera for contraception/period management PLAN: Follow-up: in 11-13 weeks for next Depo   Jahaziel Francois A Jezel Basto  05/11/2021 9:50 AM

## 2021-05-24 ENCOUNTER — Telehealth: Payer: Medicaid Other | Admitting: Physician Assistant

## 2021-05-24 DIAGNOSIS — N390 Urinary tract infection, site not specified: Secondary | ICD-10-CM

## 2021-05-24 MED ORDER — FLUCONAZOLE 150 MG PO TABS
150.0000 mg | ORAL_TABLET | Freq: Once | ORAL | 0 refills | Status: AC
Start: 2021-05-24 — End: 2021-05-24

## 2021-05-24 MED ORDER — CEPHALEXIN 500 MG PO CAPS: 500.0000 mg | ORAL_CAPSULE | Freq: Two times a day (BID) | ORAL | 0 refills | Status: AC

## 2021-05-24 NOTE — Patient Instructions (Signed)
Rene Kocher, thank you for joining [name], PA-C for today's virtual visit.  While this provider is not your primary care provider (PCP), if your PCP is located in our provider database this encounter information will be shared with them immediately following your visit.  Consent: (Patient) Gail Fields provided verbal consent for this virtual visit at the beginning of the encounter.  Current Medications:  Current Outpatient Medications:    cephALEXin (KEFLEX) 500 MG capsule, Take 1 capsule (500 mg total) by mouth 2 (two) times daily for 7 days., Disp: 14 capsule, Rfl: 0   fluconazole (DIFLUCAN) 150 MG tablet, Take 1 tablet (150 mg total) by mouth once for 1 dose., Disp: 1 tablet, Rfl: 0   fexofenadine-pseudoephedrine (ALLEGRA-D ALLERGY & CONGESTION) 60-120 MG 12 hr tablet, Take 1 tablet by mouth 2 (two) times daily., Disp: 30 tablet, Rfl: 3   medroxyPROGESTERone (DEPO-PROVERA) 150 MG/ML injection, Inject 1 mL (150 mg total) into the muscle every 3 (three) months., Disp: 1 mL, Rfl: 5   mometasone (NASONEX) 50 MCG/ACT nasal spray, Place 2 sprays into the nose daily., Disp: 1 each, Rfl: 0   omeprazole (PRILOSEC) 20 MG capsule, 1 PO 30 MINS PRIOR TO BREAKFAST., Disp: 90 capsule, Rfl: 3   polyethylene glycol (MIRALAX / GLYCOLAX) 17 g packet, Take 17 g by mouth daily. (Patient not taking: Reported on 03/29/2021), Disp: , Rfl:  No current facility-administered medications for this visit.  Facility-Administered Medications Ordered in Other Visits:    lidocaine 2 % w/epi 1:200,000 (20 ml) with sod bicarb 8.4 % (2 ml) inj, , , Continuous PRN, Lorn Junes, Debra R, CRNA, 5 mL at 08/18/15 2327   Medications ordered in this encounter:  Meds ordered this encounter  Medications   cephALEXin (KEFLEX) 500 MG capsule    Sig: Take 1 capsule (500 mg total) by mouth 2 (two) times daily for 7 days.    Dispense:  14 capsule    Refill:  0    Order Specific Question:   Supervising Provider     Answer:   MILLER, BRIAN [[id number]]   fluconazole (DIFLUCAN) 150 MG tablet    Sig: Take 1 tablet (150 mg total) by mouth once for 1 dose.    Dispense:  1 tablet    Refill:  0    Order Specific Question:   Supervising Provider    Answer:   Hyacinth Meeker, BRIAN [[id number]]     *If you need refills on other medications prior to your next appointment, please contact your pharmacy*  Follow-Up: Call back or seek an in-person evaluation if the symptoms worsen or if the condition fails to improve as anticipated.  Other Instructions Your symptoms are consistent with a bladder infection, also called acute cystitis. Please take your antibiotic (Keflex) as directed until all pills are gone.  Stay very well hydrated.  Consider a daily probiotic (Align, Culturelle, or Activia) to help prevent stomach upset caused by the antibiotic.  Taking a probiotic daily may also help prevent recurrent UTIs.  Also consider taking AZO (Phenazopyridine) tablets to help decrease pain with urination.   I have sent in a Diflucan for yeast as well.  If not resolving, you need to follow-up in person with your PCP or Dr. Despina Hidden, or be seen at a local Urgent Care.   Urinary Tract Infection A urinary tract infection (UTI) can occur any place along the urinary tract. The tract includes the kidneys, ureters, bladder, and urethra. A type of germ called bacteria often  causes a UTI. UTIs are often helped with antibiotic medicine.  HOME CARE  If given, take antibiotics as told by your doctor. Finish them even if you start to feel better. Drink enough fluids to keep your pee (urine) clear or pale yellow. Avoid tea, drinks with caffeine, and bubbly (carbonated) drinks. Pee often. Avoid holding your pee in for a long time. Pee before and after having sex (intercourse). Wipe from front to back after you poop (bowel movement) if you are a woman. Use each tissue only once. GET HELP RIGHT AWAY IF:  You have back pain. You have lower belly (abdominal)  pain. You have chills. You feel sick to your stomach (nauseous). You throw up (vomit). Your burning or discomfort with peeing does not go away. You have a fever. Your symptoms are not better in 3 days. MAKE SURE YOU:  Understand these instructions. Will watch your condition. Will get help right away if you are not doing well or get worse. Document Released: 04/10/2008 Document Revised: 07/17/2012 Document Reviewed: 05/23/2012 [facility] Patient Information 2015 [place], [place]. This information is not intended to replace advice given to you by your health care provider. Make sure you discuss any questions you have with your health care provider.    If you have been instructed to have an in-person evaluation today at a local Urgent Care facility, please use the link below. It will take you to a list of all of our available [facility], including address, phone number and hours of operation. Please do not delay care.  [facility]  If you or a family member do not have a primary care provider, use the link below to schedule a visit and establish care. When you choose a [facility] primary care physician or advanced practice provider, you gain a long-term partner in health. Find a Primary Care Provider  Learn more about [facility]'s in-office and virtual care options: [place] - Get Care Now

## 2021-05-24 NOTE — Progress Notes (Signed)
Virtual Visit Consent   Gail Fields, you are scheduled for a virtual visit with a [facility] provider today.     Just as with appointments in the office, your consent must be obtained to participate.  Your consent will be active for this visit and any virtual visit you may have with one of our providers in the next 365 days.     If you have a MyChart account, a copy of this consent can be sent to you electronically.  All virtual visits are billed to your insurance company just like a traditional visit in the office.    As this is a virtual visit, video technology does not allow for your provider to perform a traditional examination.  This may limit your provider's ability to fully assess your condition.  If your provider identifies any concerns that need to be evaluated in person or the need to arrange testing (such as labs, EKG, etc.), we will make arrangements to do so.     Although advances in technology are sophisticated, we cannot ensure that it will always work on either your end or our end.  If the connection with a video visit is poor, the visit may have to be switched to a telephone visit.  With either a video or telephone visit, we are not always able to ensure that we have a secure connection.     I need to obtain your verbal consent now.   Are you willing to proceed with your visit today?    Gail Fields has provided verbal consent on 05/24/2021 for a virtual visit (video or telephone).   Gail Fields, [place]   Date: 05/24/2021 10:41 AM   Virtual Visit via Video Note   I, Gail Fields, connected with  Gail Fields  ([id number], 1986/04/30) on 05/24/21 at 10:30 AM EDT by a video-enabled telemedicine application and verified that I am speaking with the correct person using two identifiers.  Location: Patient: Virtual Visit Location Patient: Home Provider: Virtual Visit Location Provider: Home Office   I discussed the limitations of evaluation and  management by telemedicine and the availability of in person appointments. The patient expressed understanding and agreed to proceed.    History of Present Illness: Gail Fields is a 35 y.o. who identifies as a female who was assigned female at birth, and is being seen today for possible UTI. Patient endorses urinary urgency, frequency, dysuria and suprapubic pressure over the past 3 days. Denies fever, chills, flank pain, nausea/vomiting, hematuria. Also noting mild vaginal pruritus and erythema. Denies vaginal discharge or concern for STI. Is currently on DepoProvera so no concern for pregnancy. LMP 3 years ago when she started on the Depo. Notes a few weeks ago she had hemorrhoidectomy and was told would take a couple of months to fully heal. As such she occasional gets a bit of leakage and has worn pads because of this. Notes prior to onset of symptoms some stool leakage got up in the vagina.   HPI: HPI  Problems:  Patient Active Problem List   Diagnosis Date Noted   UTI (urinary tract infection) 12/08/2020   Anal skin tag 06/02/2020   Rectal pain 09/14/2019   Rectal bleeding 09/10/2019   GERD (gastroesophageal reflux disease) 09/10/2019   Prolapsed hemorrhoids, grade 4, s/p ligation/pexy/hemorrhoidectomy 04/24/2018 04/24/2018   Post concussive syndrome 10/26/2017   Dyspareunia due to medical condition in female 12/06/2016   Abnormal Pap smear of cervix 01/20/2015   Hemorrhoids  10/13/2014   Polyp at cervical os 10/13/2014   Patellofemoral stress syndrome 10/13/2014    Allergies: No Known Allergies Medications:  Current Outpatient Medications:    cephALEXin (KEFLEX) 500 MG capsule, Take 1 capsule (500 mg total) by mouth 2 (two) times daily for 7 days., Disp: 14 capsule, Rfl: 0   fluconazole (DIFLUCAN) 150 MG tablet, Take 1 tablet (150 mg total) by mouth once for 1 dose., Disp: 1 tablet, Rfl: 0   fexofenadine-pseudoephedrine (ALLEGRA-D ALLERGY & CONGESTION) 60-120 MG 12 hr tablet,  Take 1 tablet by mouth 2 (two) times daily., Disp: 30 tablet, Rfl: 3   medroxyPROGESTERone (DEPO-PROVERA) 150 MG/ML injection, Inject 1 mL (150 mg total) into the muscle every 3 (three) months., Disp: 1 mL, Rfl: 5   mometasone (NASONEX) 50 MCG/ACT nasal spray, Place 2 sprays into the nose daily., Disp: 1 each, Rfl: 0   omeprazole (PRILOSEC) 20 MG capsule, 1 PO 30 MINS PRIOR TO BREAKFAST., Disp: 90 capsule, Rfl: 3   polyethylene glycol (MIRALAX / GLYCOLAX) 17 g packet, Take 17 g by mouth daily. (Patient not taking: Reported on 03/29/2021), Disp: , Rfl:  No current facility-administered medications for this visit.  Facility-Administered Medications Ordered in Other Visits:    lidocaine 2 % w/epi 1:200,000 (20 ml) with sod bicarb 8.4 % (2 ml) inj, , , Continuous PRN, Lorn Junes, Debra R, CRNA, 5 mL at 08/18/15 2327  Observations/Objective: Patient is well-developed, well-nourished in no acute distress.  Resting comfortably at home.  Head is normocephalic, atraumatic.  No labored breathing. Speech is clear and coherent with logical content.  Patient is alert and oriented at baseline.   Assessment and Plan: 1. UTI (urinary tract infection), uncomplicated - cephALEXin (KEFLEX) 500 MG capsule; Take 1 capsule (500 mg total) by mouth 2 (two) times daily for 7 days.  Dispense: 14 capsule; Refill: 0 - fluconazole (DIFLUCAN) 150 MG tablet; Take 1 tablet (150 mg total) by mouth once for 1 dose.  Dispense: 1 tablet; Refill: 0 Afebrile. No alarm signs/symptoms. Likely e coli giving history. Will start empiric treatment with Keflex 500 mg BID x 7 days. Supportive measures and OTC medications reviewed. Will also give one tablet of Diflucan 150 mg in case of comorbid yeast infection giving itch and vaginal irritation.Strict in person follow-up precautions reviewed with patient.   Follow Up Instructions: I discussed the assessment and treatment plan with the patient. The patient was provided an opportunity to ask  questions and all were answered. The patient agreed with the plan and demonstrated an understanding of the instructions.  A copy of instructions were sent to the patient via MyChart.  The patient was advised to call back or seek an in-person evaluation if the symptoms worsen or if the condition fails to improve as anticipated.  Time:  I spent 12 minutes with the patient via telehealth technology discussing the above problems/concerns.    Gail Climes, PA-C

## 2021-06-05 ENCOUNTER — Telehealth: Payer: Medicaid Other | Admitting: Physician Assistant

## 2021-06-05 DIAGNOSIS — T3695XA Adverse effect of unspecified systemic antibiotic, initial encounter: Secondary | ICD-10-CM

## 2021-06-05 DIAGNOSIS — B379 Candidiasis, unspecified: Secondary | ICD-10-CM | POA: Diagnosis not present

## 2021-06-06 MED ORDER — FLUCONAZOLE 150 MG PO TABS: 150.0000 mg | ORAL_TABLET | Freq: Once | ORAL | 0 refills | Status: AC

## 2021-06-06 NOTE — Progress Notes (Signed)

## 2021-06-07 ENCOUNTER — Telehealth: Payer: Medicaid Other | Admitting: Physician Assistant

## 2021-06-07 ENCOUNTER — Encounter: Payer: Self-pay | Admitting: Physician Assistant

## 2021-06-07 DIAGNOSIS — J019 Acute sinusitis, unspecified: Secondary | ICD-10-CM | POA: Diagnosis not present

## 2021-06-07 DIAGNOSIS — B9789 Other viral agents as the cause of diseases classified elsewhere: Secondary | ICD-10-CM | POA: Diagnosis not present

## 2021-06-07 MED ORDER — FLUTICASONE PROPIONATE 50 MCG/ACT NA SUSP: 2.0000 | Freq: Every day | NASAL | 6 refills | Status: AC

## 2021-06-07 MED ORDER — IPRATROPIUM BROMIDE 0.03 % NA SOLN
2.0000 | Freq: Two times a day (BID) | NASAL | 12 refills | Status: DC
Start: 2021-06-07 — End: 2022-01-24

## 2021-06-07 NOTE — Progress Notes (Signed)
Virtual Visit Consent   Gail Fields, you are scheduled for a virtual visit with a [facility] provider today.     Just as with appointments in the office, your consent must be obtained to participate.  Your consent will be active for this visit and any virtual visit you may have with one of our providers in the next 365 days.     If you have a MyChart account, a copy of this consent can be sent to you electronically.  All virtual visits are billed to your insurance company just like a traditional visit in the office.    As this is a virtual visit, video technology does not allow for your provider to perform a traditional examination.  This may limit your provider's ability to fully assess your condition.  If your provider identifies any concerns that need to be evaluated in person or the need to arrange testing (such as labs, EKG, etc.), we will make arrangements to do so.     Although advances in technology are sophisticated, we cannot ensure that it will always work on either your end or our end.  If the connection with a video visit is poor, the visit may have to be switched to a telephone visit.  With either a video or telephone visit, we are not always able to ensure that we have a secure connection.     I need to obtain your verbal consent now.   Are you willing to proceed with your visit today?    Mikayla Chiusano has provided verbal consent on 06/07/2021 for a virtual visit (video or telephone).   Margaretann Loveless, PA-C   Date: 06/07/2021 3:00 PM   Virtual Visit via Video Note   I, Margaretann Loveless, connected with  Gail Fields  ([id number], 1986/09/24) on 06/07/21 at  3:00 PM EDT by a video-enabled telemedicine application and verified that I am speaking with the correct person using two identifiers.  Location: Patient: Virtual Visit Location Patient: Home Provider: Virtual Visit Location Provider: Home Office   I discussed the limitations of evaluation and  management by telemedicine and the availability of in person appointments. The patient expressed understanding and agreed to proceed.    History of Present Illness: Gail Fields is a 34 y.o. who identifies as a female who was assigned female at birth, and is being seen today for possible sinusitis.  HPI: Sinusitis This is a new problem. The current episode started in the past 7 days (3 days ago). The problem has been gradually worsening since onset. There has been no fever. Associated symptoms include congestion, ear pain (right ear pops occasionally), headaches, a hoarse voice, sinus pressure, sneezing and a sore throat. Pertinent negatives include no chills, coughing or shortness of breath. (Post nasal drainage) Treatments tried: allegra d, advil cold and sinus. The treatment provided no relief.    Problems:  Patient Active Problem List   Diagnosis Date Noted   UTI (urinary tract infection) 12/08/2020   Anal skin tag 06/02/2020   Rectal pain 09/14/2019   Rectal bleeding 09/10/2019   GERD (gastroesophageal reflux disease) 09/10/2019   Prolapsed hemorrhoids, grade 4, s/p ligation/pexy/hemorrhoidectomy 04/24/2018 04/24/2018   Post concussive syndrome 10/26/2017   Dyspareunia due to medical condition in female 12/06/2016   Abnormal Pap smear of cervix 01/20/2015   Hemorrhoids 10/13/2014   Polyp at cervical os 10/13/2014   Patellofemoral stress syndrome 10/13/2014    Allergies: No Known Allergies Medications:  Current Outpatient  Medications:    fluticasone (FLONASE) 50 MCG/ACT nasal spray, Place 2 sprays into both nostrils daily., Disp: 16 g, Rfl: 6   ipratropium (ATROVENT) 0.03 % nasal spray, Place 2 sprays into both nostrils every 12 (twelve) hours., Disp: 30 mL, Rfl: 12   fexofenadine-pseudoephedrine (ALLEGRA-D ALLERGY & CONGESTION) 60-120 MG 12 hr tablet, Take 1 tablet by mouth 2 (two) times daily., Disp: 30 tablet, Rfl: 3   medroxyPROGESTERone (DEPO-PROVERA) 150 MG/ML injection,  Inject 1 mL (150 mg total) into the muscle every 3 (three) months., Disp: 1 mL, Rfl: 5   mometasone (NASONEX) 50 MCG/ACT nasal spray, Place 2 sprays into the nose daily., Disp: 1 each, Rfl: 0   omeprazole (PRILOSEC) 20 MG capsule, 1 PO 30 MINS PRIOR TO BREAKFAST., Disp: 90 capsule, Rfl: 3   polyethylene glycol (MIRALAX / GLYCOLAX) 17 g packet, Take 17 g by mouth daily. (Patient not taking: Reported on 03/29/2021), Disp: , Rfl:  No current facility-administered medications for this visit.  Facility-Administered Medications Ordered in Other Visits:    lidocaine 2 % w/epi 1:200,000 (20 ml) with sod bicarb 8.4 % (2 ml) inj, , , Continuous PRN, Lorn Junes, Debra R, CRNA, 5 mL at 08/18/15 2327  Observations/Objective: Patient is well-developed, well-nourished in no acute distress.  Resting comfortably at home.  Head is normocephalic, atraumatic.  No labored breathing.  Speech is clear and coherent with logical content.  Patient is alert and oriented at baseline.    Assessment and Plan: 1. Acute viral sinusitis - fluticasone (FLONASE) 50 MCG/ACT nasal spray; Place 2 sprays into both nostrils daily.  Dispense: 16 g; Refill: 6 - ipratropium (ATROVENT) 0.03 % nasal spray; Place 2 sprays into both nostrils every 12 (twelve) hours.  Dispense: 30 mL; Refill: 12  - Suspect viral sinusitis since symptoms have been present only 3 days and without fever - Continue allergy medications and OTC symptomatic management - Will add Fluticasone and Ipratropium nasal sprays as noted above - Please call back, or seek in-person evaluation if symptoms fail to improve or worsen.   Follow Up Instructions: I discussed the assessment and treatment plan with the patient. The patient was provided an opportunity to ask questions and all were answered. The patient agreed with the plan and demonstrated an understanding of the instructions.  A copy of instructions were sent to the patient via MyChart.  The patient was advised  to call back or seek an in-person evaluation if the symptoms worsen or if the condition fails to improve as anticipated.  Time:  I spent 10 minutes with the patient via telehealth technology discussing the above problems/concerns.    Margaretann Loveless, PA-C

## 2021-06-07 NOTE — Patient Instructions (Signed)
Rene Kocher, thank you for joining Margaretann Loveless, PA-C for today's virtual visit.  While this provider is not your primary care provider (PCP), if your PCP is located in our provider database this encounter information will be shared with them immediately following your visit.  Consent: (Patient) Gail Fields provided verbal consent for this virtual visit at the beginning of the encounter.  Current Medications:  Current Outpatient Medications:    fluticasone (FLONASE) 50 MCG/ACT nasal spray, Place 2 sprays into both nostrils daily., Disp: 16 g, Rfl: 6   ipratropium (ATROVENT) 0.03 % nasal spray, Place 2 sprays into both nostrils every 12 (twelve) hours., Disp: 30 mL, Rfl: 12   fexofenadine-pseudoephedrine (ALLEGRA-D ALLERGY & CONGESTION) 60-120 MG 12 hr tablet, Take 1 tablet by mouth 2 (two) times daily., Disp: 30 tablet, Rfl: 3   medroxyPROGESTERone (DEPO-PROVERA) 150 MG/ML injection, Inject 1 mL (150 mg total) into the muscle every 3 (three) months., Disp: 1 mL, Rfl: 5   mometasone (NASONEX) 50 MCG/ACT nasal spray, Place 2 sprays into the nose daily., Disp: 1 each, Rfl: 0   omeprazole (PRILOSEC) 20 MG capsule, 1 PO 30 MINS PRIOR TO BREAKFAST., Disp: 90 capsule, Rfl: 3   polyethylene glycol (MIRALAX / GLYCOLAX) 17 g packet, Take 17 g by mouth daily. (Patient not taking: Reported on 03/29/2021), Disp: , Rfl:  No current facility-administered medications for this visit.  Facility-Administered Medications Ordered in Other Visits:    lidocaine 2 % w/epi 1:200,000 (20 ml) with sod bicarb 8.4 % (2 ml) inj, , , Continuous PRN, Merritt, Debra R, CRNA, 5 mL at 08/18/15 2327   Medications ordered in this encounter:  Meds ordered this encounter  Medications   fluticasone (FLONASE) 50 MCG/ACT nasal spray    Sig: Place 2 sprays into both nostrils daily.    Dispense:  16 g    Refill:  6    Order Specific Question:   Supervising Provider    Answer:   MILLER, BRIAN [[id number]]    ipratropium (ATROVENT) 0.03 % nasal spray    Sig: Place 2 sprays into both nostrils every 12 (twelve) hours.    Dispense:  30 mL    Refill:  12    Order Specific Question:   Supervising Provider    Answer:   Eber Hong [[id number]]     *If you need refills on other medications prior to your next appointment, please contact your pharmacy*  Follow-Up: Call back or seek an in-person evaluation if the symptoms worsen or if the condition fails to improve as anticipated.  Other Instructions See below   If you have been instructed to have an in-person evaluation today at a local Urgent Care facility, please use the link below. It will take you to a list of all of our available [facility], including address, phone number and hours of operation. Please do not delay care.  [facility]  If you or a family member do not have a primary care provider, use the link below to schedule a visit and establish care. When you choose a [facility] primary care physician or advanced practice provider, you gain a long-term partner in health. Find a Primary Care Provider  Learn more about [facility]'s in-office and virtual care options: [facility] Get Care Now  Sinusitis, Adult Sinusitis is inflammation of your sinuses. Sinuses are hollow spaces in the bones around your face. Your sinuses are located: Around your eyes. In the middle of  your forehead. Behind your nose. In your cheekbones. Mucus normally drains out of your sinuses. When your nasal tissues become inflamed or swollen, mucus can become trapped or blocked. This allows bacteria, viruses, and fungi to grow, which leads to infection. Most infections of thesinuses are caused by a virus. Sinusitis can develop quickly. It can last for up to 4 weeks (acute) or for more than 12 weeks (chronic). Sinusitis often develops after a cold. What are the causes? This condition is caused by anything that creates swelling in the sinuses  or stops mucus from draining. This includes: Allergies. Asthma. Infection from bacteria or viruses. Deformities or blockages in your nose or sinuses. Abnormal growths in the nose (nasal polyps). Pollutants, such as chemicals or irritants in the air. Infection from fungi (rare). What increases the risk? You are more likely to develop this condition if you: Have a weak body defense system (immune system). Do a lot of swimming or diving. Overuse nasal sprays. Smoke. What are the signs or symptoms? The main symptoms of this condition are pain and a feeling of pressure around the affected sinuses. Other symptoms include: Stuffy nose or congestion. Thick drainage from your nose. Swelling and warmth over the affected sinuses. Headache. Upper toothache. A cough that may get worse at night. Extra mucus that collects in the throat or the back of the nose (postnasal drip). Decreased sense of smell and taste. Fatigue. A fever. Sore throat. Bad breath. How is this diagnosed? This condition is diagnosed based on: Your symptoms. Your medical history. A physical exam. Tests to find out if your condition is acute or chronic. This may include: Checking your nose for nasal polyps. Viewing your sinuses using a device that has a light (endoscope). Testing for allergies or bacteria. Imaging tests, such as an MRI or CT scan. In rare cases, a bone biopsy may be done to rule out more serious types offungal sinus disease. How is this treated? Treatment for sinusitis depends on the cause and whether your condition is chronic or acute. If caused by a virus, your symptoms should go away on their own within 10 days. You may be given medicines to relieve symptoms. They include: Medicines that shrink swollen nasal passages (topical intranasal decongestants). Medicines that treat allergies (antihistamines). A spray that eases inflammation of the nostrils (topical intranasal corticosteroids). Rinses that  help get rid of thick mucus in your nose (nasal saline washes). If caused by bacteria, your health care provider may recommend waiting to see if your symptoms improve. Most bacterial infections will get better without antibiotic medicine. You may be given antibiotics if you have: A severe infection. A weak immune system. If caused by narrow nasal passages or nasal polyps, you may need to have surgery. Follow these instructions at home: Medicines Take, use, or apply over-the-counter and prescription medicines only as told by your health care provider. These may include nasal sprays. If you were prescribed an antibiotic medicine, take it as told by your health care provider. Do not stop taking the antibiotic even if you start to feel better. Hydrate and humidify  Drink enough fluid to keep your urine pale yellow. Staying hydrated will help to thin your mucus. Use a cool mist humidifier to keep the humidity level in your home above 50%. Inhale steam for 10-15 minutes, 3-4 times a day, or as told by your health care provider. You can do this in the bathroom while a hot shower is running. Limit your exposure to cool or dry  air.  Rest Rest as much as possible. Sleep with your head raised (elevated). Make sure you get enough sleep each night. General instructions  Apply a warm, moist washcloth to your face 3-4 times a day or as told by your health care provider. This will help with discomfort. Wash your hands often with soap and water to reduce your exposure to germs. If soap and water are not available, use hand sanitizer. Do not smoke. Avoid being around people who are smoking (secondhand smoke). Keep all follow-up visits as told by your health care provider. This is important.  Contact a health care provider if: You have a fever. Your symptoms get worse. Your symptoms do not improve within 10 days. Get help right away if: You have a severe headache. You have persistent vomiting. You  have severe pain or swelling around your face or eyes. You have vision problems. You develop confusion. Your neck is stiff. You have trouble breathing. Summary Sinusitis is soreness and inflammation of your sinuses. Sinuses are hollow spaces in the bones around your face. This condition is caused by nasal tissues that become inflamed or swollen. The swelling traps or blocks the flow of mucus. This allows bacteria, viruses, and fungi to grow, which leads to infection. If you were prescribed an antibiotic medicine, take it as told by your health care provider. Do not stop taking the antibiotic even if you start to feel better. Keep all follow-up visits as told by your health care provider. This is important. This information is not intended to replace advice given to you by your health care provider. Make sure you discuss any questions you have with your healthcare provider. Document Revised: 03/25/2018 Document Reviewed: 03/25/2018 Elsevier Patient Education  2022 [employer].

## 2021-06-25 ENCOUNTER — Telehealth: Payer: Medicaid Other | Admitting: Physician Assistant

## 2021-06-25 ENCOUNTER — Encounter: Payer: Self-pay | Admitting: Physician Assistant

## 2021-06-25 DIAGNOSIS — N949 Unspecified condition associated with female genital organs and menstrual cycle: Secondary | ICD-10-CM

## 2021-06-25 MED ORDER — METRONIDAZOLE 500 MG PO TABS: 500.0000 mg | ORAL_TABLET | Freq: Two times a day (BID) | ORAL | 0 refills | Status: AC

## 2021-06-25 NOTE — Progress Notes (Addendum)
Virtual Visit via Video Note  I connected with Gail Fields on 06/25/21 at 11:29 AM EDT by a video enabled telemedicine application and verified that I am speaking with the correct person using two identifiers.  Location: Patient: Patient's home Provider: Provider's office  Person participating in the virtual visit: Patient and provider    I discussed the limitations of evaluation and management by telemedicine and the availability of in person appointments. The patient expressed understanding and agreed to proceed.  I discussed the assessment and treatment plan with the patient. The patient was provided an opportunity to ask questions and all were answered. The patient agreed with the plan and demonstrated an understanding of the instructions.   The patient was advised to call back or seek an in-person evaluation if the symptoms worsen or if the condition fails to improve as anticipated.  I provided 15 minutes of non-face-to-face time during this encounter.   Demetrio Lapping, PA-C     Subjective:    Patient ID: Gail Fields, female    DOB: 1986/10/11, 35 y.o.   MRN: [id number]  No chief complaint on file.   35 yo F connects via video requesting medication for BV rather than BV cream she was prescribed yesterday. States she went to urgent care yesterday and was diagnosed with BV and was prescribed Metronidazole gel. States she used it but it caused burning and she is requesting the oral pills instead. States has sxs of vaginal burning x 5 days. Denies any risk for STD or new sexual partners. States she "had testing done " and was told she was "positive for BV". Denies any allergy to Metronidazole.   Denies any fever, chills, dysuria, urgency, frequency, back pain, abd pain, n, v, vaginal discharge, or vaginal itching, or genital rash.   Other Pertinent negatives include no abdominal pain, arthralgias, chest pain, chills, diaphoresis, fatigue, fever, nausea, rash or  vomiting.  Urinary Tract Infection  Pertinent negatives include no chills, discharge, flank pain, frequency, hematuria, hesitancy, nausea, possible pregnancy, sweats, urgency or vomiting. There is no history of catheterization, recurrent UTIs or a urological procedure.  Patient is in today for BV medicine- not gel  Past Medical History:  Diagnosis Date   Acute cystitis    BV (bacterial vaginosis)    Hemorrhoid    Hx of chlamydia infection    Patellofemoral stress syndrome    Polyp at cervical os    Trichimoniasis    Vaginal Pap smear, abnormal 09/22/14   ASCUS- +HPV   Yeast infection     Past Surgical History:  Procedure Laterality Date   COLONOSCOPY N/A 01/02/2020   Procedure: COLONOSCOPY;  Surgeon: West Bali, MD;  Location: [facility];  Service: Endoscopy;  Laterality: N/A;  11:00am   HEMORRHOID SURGERY N/A 04/24/2018   Procedure: HEMORRHOIDECTOMY;  Surgeon: Karie Soda, MD;  Location: [facility];  Service: General;  Laterality: N/A;   PEXY N/A 04/24/2018   Procedure: HEMORRHOIDAL LIGATION/PEXY;  Surgeon: Karie Soda, MD;  Location: [facility];  Service: General;  Laterality: N/A;   RECTAL EXAM UNDER ANESTHESIA N/A 04/24/2018   Procedure: ANORECTAL EXAM UNDER ANESTHESIA;  Surgeon: Karie Soda, MD;  Location: [facility];  Service: General;  Laterality: N/A;    Family History  Problem Relation Age of Onset   Cancer Maternal Grandmother        breast   Seizures Paternal Grandmother    Colon cancer Neg Hx     Social History   Socioeconomic History  Marital status: Single    Spouse name: Not on file   Number of children: Not on file   Years of education: Not on file   Highest education level: Not on file  Occupational History   Not on file  Tobacco Use   Smoking status: Never   Smokeless tobacco: Never  Vaping Use   Vaping Use: Never used  Substance and Sexual Activity   Alcohol use: No   Drug use: No   Sexual activity: Yes    Birth control/protection: Injection   Other Topics Concern   Not on file  Social History Narrative   Not on file   Social Determinants of Health   Financial Resource Strain: Low Risk    Difficulty of Paying Living Expenses: Not hard at all  Food Insecurity: No Food Insecurity   Worried About Programme researcher, broadcasting/film/video in the Last Year: Never true   Ran Out of Food in the Last Year: Never true  Transportation Needs: No Transportation Needs   Lack of Transportation (Medical): No   Lack of Transportation (Non-Medical): No  Physical Activity: Insufficiently Active   Days of Exercise per Week: 2 days   Minutes of Exercise per Session: 10 min  Stress: No Stress Concern Present   Feeling of Stress : Only a little  Social Connections: Unknown   Frequency of Communication with Friends and Family: More than three times a week   Frequency of Social Gatherings with Friends and Family: Three times a week   Attends Religious Services: 1 to 4 times per year   Active Member of Clubs or Organizations: No   Attends Banker Meetings: Never   Marital Status: Not on file  Intimate Partner Violence: Not At Risk   Fear of Current or Ex-Partner: No   Emotionally Abused: No   Physically Abused: No   Sexually Abused: No    Outpatient Medications Prior to Visit  Medication Sig Dispense Refill   fexofenadine-pseudoephedrine (ALLEGRA-D ALLERGY & CONGESTION) 60-120 MG 12 hr tablet Take 1 tablet by mouth 2 (two) times daily. 30 tablet 3   fluticasone (FLONASE) 50 MCG/ACT nasal spray Place 2 sprays into both nostrils daily. 16 g 6   ipratropium (ATROVENT) 0.03 % nasal spray Place 2 sprays into both nostrils every 12 (twelve) hours. 30 mL 12   medroxyPROGESTERone (DEPO-PROVERA) 150 MG/ML injection Inject 1 mL (150 mg total) into the muscle every 3 (three) months. 1 mL 5   mometasone (NASONEX) 50 MCG/ACT nasal spray Place 2 sprays into the nose daily. 1 each 0   omeprazole (PRILOSEC) 20 MG capsule 1 PO 30 MINS PRIOR TO BREAKFAST. 90  capsule 3   polyethylene glycol (MIRALAX / GLYCOLAX) 17 g packet Take 17 g by mouth daily. (Patient not taking: Reported on 03/29/2021)     Facility-Administered Medications Prior to Visit  Medication Dose Route Frequency Provider Last Rate Last Admin   lidocaine 2 % w/epi 1:200,000 (20 ml) with sod bicarb 8.4 % (2 ml) inj    Continuous PRN Marrion Coy R, CRNA   5 mL at 08/18/15 2327    No Known Allergies  Review of Systems  Constitutional:  Negative for activity change, appetite change, chills, diaphoresis, fatigue and fever.  Respiratory:  Negative for chest tightness and shortness of breath.   Cardiovascular:  Negative for chest pain.  Gastrointestinal:  Negative for abdominal pain, diarrhea, nausea and vomiting.  Genitourinary:  Negative for decreased urine volume, difficulty urinating, dyspareunia, dysuria, enuresis, flank  pain, frequency, genital sores, hematuria, hesitancy, menstrual problem, pelvic pain, urgency, vaginal bleeding, vaginal discharge and vaginal pain.       Pos vaginal burning  Musculoskeletal:  Negative for arthralgias and back pain.  Skin:  Negative for rash.  Hematological:  Negative for adenopathy.  Psychiatric/Behavioral:  Negative for agitation, behavioral problems and confusion.       Objective:    Physical Exam Constitutional:      General: She is not in acute distress.    Appearance: Normal appearance. She is not ill-appearing, toxic-appearing or diaphoretic.  Pulmonary:     Effort: No respiratory distress.  Neurological:     Mental Status: She is alert and oriented to person, place, and time.  Psychiatric:        Mood and Affect: Mood normal.        Behavior: Behavior normal.        Thought Content: Thought content normal.        Judgment: Judgment normal.    There were no vitals taken for this visit. Wt Readings from Last 3 Encounters:  03/29/21 185 lb (83.9 kg)  12/08/20 180 lb (81.6 kg)  12/24/19 174 lb (78.9 kg)    Health  Maintenance Due  Topic Date Due   COVID-19 Vaccine (1) Never done   Pneumococcal Vaccine 70-49 Years old (1 - PCV) Never done   Hepatitis C Screening  Never done   INFLUENZA VACCINE  06/06/2021    There are no preventive care reminders to display for this patient.   No results found for: TSH Lab Results  Component Value Date   WBC 8.5 09/16/2019   HGB 13.3 09/16/2019   HCT 38.1 09/16/2019   MCV 87 09/16/2019   PLT 232 09/16/2019   Lab Results  Component Value Date   NA 142 04/23/2018   K 4.5 04/23/2018   CO2 29 04/23/2018   GLUCOSE 93 04/23/2018   BUN 11 04/23/2018   CREATININE 0.89 04/23/2018   BILITOT 1.3 (H) 12/23/2017   ALKPHOS 50 12/23/2017   AST 22 12/23/2017   ALT 16 12/23/2017   PROT 7.6 12/23/2017   ALBUMIN 4.2 12/23/2017   CALCIUM 9.6 04/23/2018   ANIONGAP 7 04/23/2018   No results found for: CHOL No results found for: HDL No results found for: LDLCALC No results found for: TRIG No results found for: CHOLHDL No results found for: TDVV6H     Assessment & Plan:   Problem List Items Addressed This Visit   None Visit Diagnoses     Vaginal burning    -  Primary        Meds ordered this encounter  Medications   metroNIDAZOLE (FLAGYL) 500 MG tablet    Sig: Take 1 tablet (500 mg total) by mouth 2 (two) times daily for 7 days.    Dispense:  14 tablet    Refill:  0    Order Specific Question:   Supervising Provider    Answer:   Hyacinth Meeker, BRIAN [[id number]]    Pt insisted that she was pos for BV yesterday. She denies vaginal odor and unsure about vaginal discharge. But states sxs typical of BV for her. Advised will treat as such but if sxs continue or if she develops any new sxs to have a face to fac visit for further evaluation. She voiced understanding. Advised- avoid alcohol while on flagyl Demetrio Lapping, PA-C

## 2021-08-01 ENCOUNTER — Other Ambulatory Visit: Payer: Medicaid Other

## 2021-08-04 ENCOUNTER — Other Ambulatory Visit (INDEPENDENT_AMBULATORY_CARE_PROVIDER_SITE_OTHER): Payer: Medicaid Other

## 2021-08-04 ENCOUNTER — Other Ambulatory Visit: Payer: Self-pay

## 2021-08-04 DIAGNOSIS — Z3042 Encounter for surveillance of injectable contraceptive: Secondary | ICD-10-CM

## 2021-08-04 MED ORDER — MEDROXYPROGESTERONE ACETATE 150 MG/ML IM SUSP
150.0000 mg | Freq: Once | INTRAMUSCULAR | Status: AC
  Administered 2021-08-04: 150 mg via INTRAMUSCULAR

## 2021-08-04 NOTE — Progress Notes (Signed)
   NURSE VISIT- INJECTION  SUBJECTIVE:  Gail Fields is a 35 y.o. [phone] female here for a Depo Provera for contraception/period management. She is a GYN patient.   OBJECTIVE:  There were no vitals taken for this visit.  Appears well, in no apparent distress  Injection administered in: Left deltoid  Meds ordered this encounter  Medications   medroxyPROGESTERone (DEPO-PROVERA) injection 150 mg    ASSESSMENT: GYN patient Depo Provera for contraception/period management PLAN: Follow-up: in 11-13 weeks for next Depo   Lareta Bruneau A Teigan Manner  08/04/2021 3:05 PM

## 2021-10-26 ENCOUNTER — Telehealth: Payer: BC Managed Care – PPO | Admitting: Family

## 2021-10-26 DIAGNOSIS — R399 Unspecified symptoms and signs involving the genitourinary system: Secondary | ICD-10-CM

## 2021-10-26 MED ORDER — FLUCONAZOLE 150 MG PO TABS: 150.0000 mg | ORAL_TABLET | ORAL | 0 refills | Status: DC | PRN

## 2021-10-26 MED ORDER — CEPHALEXIN 500 MG PO CAPS: 500.0000 mg | ORAL_CAPSULE | Freq: Two times a day (BID) | ORAL | 0 refills | Status: DC

## 2021-10-26 NOTE — Progress Notes (Signed)
Virtual Visit Consent   Gail Fields, you are scheduled for a virtual visit with a [facility] provider today.     Just as with appointments in the office, your consent must be obtained to participate.  Your consent will be active for this visit and any virtual visit you may have with one of our providers in the next 365 days.     If you have a MyChart account, a copy of this consent can be sent to you electronically.  All virtual visits are billed to your insurance company just like a traditional visit in the office.    As this is a virtual visit, video technology does not allow for your provider to perform a traditional examination.  This may limit your provider's ability to fully assess your condition.  If your provider identifies any concerns that need to be evaluated in person or the need to arrange testing (such as labs, EKG, etc.), we will make arrangements to do so.     Although advances in technology are sophisticated, we cannot ensure that it will always work on either your end or our end.  If the connection with a video visit is poor, the visit may have to be switched to a telephone visit.  With either a video or telephone visit, we are not always able to ensure that we have a secure connection.     I need to obtain your verbal consent now.   Are you willing to proceed with your visit today?    Gail Fields has provided verbal consent on 10/26/2021 for a virtual visit (video or telephone).   Gail Rodney, FNP   Date: 10/26/2021 8:11 AM   Virtual Visit via Video Note   I, Gail Fields, connected with  Gail Fields  ([id number], 08/13/1986) on 10/26/21 at  8:00 AM EST by a video-enabled telemedicine application and verified that I am speaking with the correct person using two identifiers.  Location: Patient: Virtual Visit Location Patient: Home Provider: Virtual Visit Location Provider: Home Office   I discussed the limitations of evaluation and management by  telemedicine and the availability of in person appointments. The patient expressed understanding and agreed to proceed.    History of Present Illness: Gail Fields is a 35 y.o. who identifies as a female who was assigned female at birth, and is being seen today for UTI symptoms.  HPI: Dysuria  This is a new problem. The current episode started in the past 7 days. The problem occurs intermittently. The problem has been gradually worsening. The quality of the pain is described as burning. The pain is at a severity of 6/10. There has been no fever. Associated symptoms include frequency, hesitancy and urgency. Pertinent negatives include no flank pain, hematuria, nausea or vomiting. Associated symptoms comments: Cloudy urine, lower abdominal pain. She has tried increased fluids for the symptoms. The treatment provided mild relief.   Problems:  Patient Active Problem List   Diagnosis Date Noted   UTI (urinary tract infection) 12/08/2020   Anal skin tag 06/02/2020   Rectal pain 09/14/2019   Rectal bleeding 09/10/2019   GERD (gastroesophageal reflux disease) 09/10/2019   Prolapsed hemorrhoids, grade 4, s/p ligation/pexy/hemorrhoidectomy 04/24/2018 04/24/2018   Post concussive syndrome 10/26/2017   Dyspareunia due to medical condition in female 12/06/2016   Abnormal Pap smear of cervix 01/20/2015   Hemorrhoids 10/13/2014   Polyp at cervical os 10/13/2014   Patellofemoral stress syndrome 10/13/2014    Allergies: No Known  Allergies Medications:  Current Outpatient Medications:    cephALEXin (KEFLEX) 500 MG capsule, Take 1 capsule (500 mg total) by mouth 2 (two) times daily., Disp: 14 capsule, Rfl: 0   fluconazole (DIFLUCAN) 150 MG tablet, Take 1 tablet (150 mg total) by mouth every three (3) days as needed., Disp: 2 tablet, Rfl: 0   fexofenadine-pseudoephedrine (ALLEGRA-D ALLERGY & CONGESTION) 60-120 MG 12 hr tablet, Take 1 tablet by mouth 2 (two) times daily., Disp: 30 tablet, Rfl: 3    fluticasone (FLONASE) 50 MCG/ACT nasal spray, Place 2 sprays into both nostrils daily., Disp: 16 g, Rfl: 6   ipratropium (ATROVENT) 0.03 % nasal spray, Place 2 sprays into both nostrils every 12 (twelve) hours., Disp: 30 mL, Rfl: 12   medroxyPROGESTERone (DEPO-PROVERA) 150 MG/ML injection, Inject 1 mL (150 mg total) into the muscle every 3 (three) months., Disp: 1 mL, Rfl: 5   mometasone (NASONEX) 50 MCG/ACT nasal spray, Place 2 sprays into the nose daily., Disp: 1 each, Rfl: 0   omeprazole (PRILOSEC) 20 MG capsule, 1 PO 30 MINS PRIOR TO BREAKFAST., Disp: 90 capsule, Rfl: 3   polyethylene glycol (MIRALAX / GLYCOLAX) 17 g packet, Take 17 g by mouth daily. (Patient not taking: Reported on 03/29/2021), Disp: , Rfl:  No current facility-administered medications for this visit.  Facility-Administered Medications Ordered in Other Visits:    lidocaine 2 % w/epi 1:200,000 (20 ml) with sod bicarb 8.4 % (2 ml) inj, , , Continuous PRN, Manley Mason, Debra R, CRNA, 5 mL at 08/18/15 2327  Observations/Objective: Patient is well-developed, well-nourished in no acute distress.  Resting comfortably at home.  Head is normocephalic, atraumatic.  No labored breathing.  Speech is clear and coherent with logical content.  Patient is alert and oriented at baseline.    Assessment and Plan: 1. UTI symptoms - cephALEXin (KEFLEX) 500 MG capsule; Take 1 capsule (500 mg total) by mouth 2 (two) times daily.  Dispense: 14 capsule; Refill: 0  Force fluids AZO over the counter X2 days RTO if symptoms worsen or do not improve   Follow Up Instructions: I discussed the assessment and treatment plan with the patient. The patient was provided an opportunity to ask questions and all were answered. The patient agreed with the plan and demonstrated an understanding of the instructions.  A copy of instructions were sent to the patient via MyChart unless otherwise noted below.     The patient was advised to call back or seek an  in-person evaluation if the symptoms worsen or if the condition fails to improve as anticipated.  Time:  I spent 4 minutes with the patient via telehealth technology discussing the above problems/concerns.    Evelina Dun, FNP

## 2021-10-26 NOTE — Patient Instructions (Signed)
Urinary Tract Infection, Adult A urinary tract infection (UTI) is an infection of any part of the urinary tract. The urinary tract includes the kidneys, ureters, bladder, and urethra. These organs make, store, and get rid of urine in the body. An upper UTI affects the ureters and kidneys. A lower UTI affects the bladder and urethra. What are the causes? Most urinary tract infections are caused by bacteria in your genital area around your urethra, where urine leaves your body. These bacteria grow and cause inflammation of your urinary tract. What increases the risk? You are more likely to develop this condition if: You have a urinary catheter that stays in place. You are not able to control when you urinate or have a bowel movement (incontinence). You are female and you: Use a spermicide or diaphragm for birth control. Have low estrogen levels. Are pregnant. You have certain genes that increase your risk. You are sexually active. You take antibiotic medicines. You have a condition that causes your flow of urine to slow down, such as: An enlarged prostate, if you are female. Blockage in your urethra. A kidney stone. A nerve condition that affects your bladder control (neurogenic bladder). Not getting enough to drink, or not urinating often. You have certain medical conditions, such as: Diabetes. A weak disease-fighting system (immunesystem). Sickle cell disease. Gout. Spinal cord injury. What are the signs or symptoms? Symptoms of this condition include: Needing to urinate right away (urgency). Frequent urination. This may include small amounts of urine each time you urinate. Pain or burning with urination. Blood in the urine. Urine that smells bad or unusual. Trouble urinating. Cloudy urine. Vaginal discharge, if you are female. Pain in the abdomen or the lower back. You may also have: Vomiting or a decreased appetite. Confusion. Irritability or tiredness. A fever or  chills. Diarrhea. The first symptom in older adults may be confusion. In some cases, they may not have any symptoms until the infection has worsened. How is this diagnosed? This condition is diagnosed based on your medical history and a physical exam. You may also have other tests, including: Urine tests. Blood tests. Tests for STIs (sexually transmitted infections). If you have had more than one UTI, a cystoscopy or imaging studies may be done to determine the cause of the infections. How is this treated? Treatment for this condition includes: Antibiotic medicine. Over-the-counter medicines to treat discomfort. Drinking enough water to stay hydrated. If you have frequent infections or have other conditions such as a kidney stone, you may need to see a health care provider who specializes in the urinary tract (urologist). In rare cases, urinary tract infections can cause sepsis. Sepsis is a life-threatening condition that occurs when the body responds to an infection. Sepsis is treated in the hospital with IV antibiotics, fluids, and other medicines. Follow these instructions at home: Medicines Take over-the-counter and prescription medicines only as told by your health care provider. If you were prescribed an antibiotic medicine, take it as told by your health care provider. Do not stop using the antibiotic even if you start to feel better. General instructions Make sure you: Empty your bladder often and completely. Do not hold urine for long periods of time. Empty your bladder after sex. Wipe from front to back after urinating or having a bowel movement if you are female. Use each tissue only one time when you wipe. Drink enough fluid to keep your urine pale yellow. Keep all follow-up visits. This is important. Contact a health care provider   if: Your symptoms do not get better after 1-2 days. Your symptoms go away and then return. Get help right away if: You have severe pain in your  back or your lower abdomen. You have a fever or chills. You have nausea or vomiting. Summary A urinary tract infection (UTI) is an infection of any part of the urinary tract, which includes the kidneys, ureters, bladder, and urethra. Most urinary tract infections are caused by bacteria in your genital area. Treatment for this condition often includes antibiotic medicines. If you were prescribed an antibiotic medicine, take it as told by your health care provider. Do not stop using the antibiotic even if you start to feel better. Keep all follow-up visits. This is important. This information is not intended to replace advice given to you by your health care provider. Make sure you discuss any questions you have with your health care provider. Document Revised: 06/04/2020 Document Reviewed: 06/04/2020 Elsevier Patient Education  2022 Elsevier Inc.  

## 2021-11-01 ENCOUNTER — Other Ambulatory Visit: Payer: Self-pay

## 2021-11-01 ENCOUNTER — Ambulatory Visit (INDEPENDENT_AMBULATORY_CARE_PROVIDER_SITE_OTHER): Payer: BC Managed Care – PPO | Admitting: *Deleted

## 2021-11-01 DIAGNOSIS — Z3042 Encounter for surveillance of injectable contraceptive: Secondary | ICD-10-CM | POA: Diagnosis not present

## 2021-11-01 MED ORDER — MEDROXYPROGESTERONE ACETATE 150 MG/ML IM SUSP
150.0000 mg | Freq: Once | INTRAMUSCULAR | Status: AC
  Administered 2021-11-01: 11:00:00 150 mg via INTRAMUSCULAR

## 2021-11-01 NOTE — Progress Notes (Signed)
° °  NURSE VISIT- INJECTION  SUBJECTIVE:  Gail Fields is a 35 y.o. [phone] female here for a Depo Provera for contraception/period management. She is a GYN patient.   OBJECTIVE:  There were no vitals taken for this visit.  Appears well, in no apparent distress  Injection administered in: Right deltoid  Meds ordered this encounter  Medications   medroxyPROGESTERone (DEPO-PROVERA) injection 150 mg    ASSESSMENT: GYN patient Depo Provera for contraception/period management PLAN: Follow-up: in 11-13 weeks for next Depo   Jobe Marker  11/01/2021 10:32 AM

## 2021-11-09 ENCOUNTER — Telehealth: Payer: Medicaid Other | Admitting: Nurse Practitioner

## 2021-11-09 DIAGNOSIS — R399 Unspecified symptoms and signs involving the genitourinary system: Secondary | ICD-10-CM

## 2021-11-09 NOTE — Progress Notes (Signed)
Based on what you shared with me it looks like you have recurrent dysuria,that should be evaluated in a face to face office visit. You were treated for uti on 10/26/21 with keflex. You now need a urinalysis and urine culture for proper treatment.  NOTE: There will be NO CHARGE for this eVisit   If you are having a true medical emergency please call 911.      For an urgent face to face visit, [facility] has six urgent care centers for your convenience:     [facility] at [facility] Directions [phone] [address] [place], [place] [address]    [facility]) Get Driving Directions [phone] [address] [place], [place] [address]  [facility] Center [facility]) Get Driving Directions [phone] [address] [place],  [place]  [address]  [facility] Urgent Care at [facility] Get Driving Directions [phone] [address] [place], [place] [address]   [facility] Urgent Care at [facility] Get Driving Directions  [phone] [address] [place], [place] [address]   [facility] Urgent Care at [facility] Directions [phone] [address] [place], [place] [address]  Your MyChart E-visit questionnaire answers were reviewed by a board certified advanced clinical practitioner to complete your personal care plan based on your specific symptoms.  Thank you for using e-Visits.

## 2022-01-11 ENCOUNTER — Other Ambulatory Visit: Payer: Self-pay | Admitting: Advanced Practice Midwife

## 2022-01-12 ENCOUNTER — Other Ambulatory Visit: Payer: Self-pay | Admitting: *Deleted

## 2022-01-12 MED ORDER — MEDROXYPROGESTERONE ACETATE 150 MG/ML IM SUSP
150.0000 mg | INTRAMUSCULAR | 5 refills | Status: DC
Start: 2022-01-12 — End: 2022-08-02

## 2022-01-17 ENCOUNTER — Telehealth: Payer: Self-pay | Admitting: Obstetrics & Gynecology

## 2022-01-17 NOTE — Telephone Encounter (Signed)
Patient has 3 recalls for pap, When is hers actually due? She needs depo refilled for appointment tomorrow. [facility] needs to [facility] to [facility] in [place]. Please advise.  ?

## 2022-01-18 ENCOUNTER — Ambulatory Visit (INDEPENDENT_AMBULATORY_CARE_PROVIDER_SITE_OTHER): Payer: BC Managed Care – PPO

## 2022-01-18 ENCOUNTER — Other Ambulatory Visit: Payer: Self-pay

## 2022-01-18 DIAGNOSIS — Z3042 Encounter for surveillance of injectable contraceptive: Secondary | ICD-10-CM

## 2022-01-18 MED ORDER — MEDROXYPROGESTERONE ACETATE 150 MG/ML IM SUSP
150.0000 mg | Freq: Once | INTRAMUSCULAR | Status: AC
  Administered 2022-01-18: 150 mg via INTRAMUSCULAR

## 2022-01-18 NOTE — Progress Notes (Addendum)
? ?  NURSE VISIT- INJECTION ? ?SUBJECTIVE:  ?Gail Fields is a 36 y.o. [phone] female here for a Depo Provera for contraception/period management. She is a GYN patient.  ? ?OBJECTIVE:  ?There were no vitals taken for this visit.  ?Appears well, in no apparent distress ? ?Injection administered in: Left deltoid ? ?Meds ordered this encounter  ?Medications  ? medroxyPROGESTERone (DEPO-PROVERA) injection 150 mg  ? ? ?ASSESSMENT: ?GYN patient Depo Provera for contraception/period management ?PLAN: ?Follow-up: in 11-13 weeks for next Depo  ? ?Ethel Veronica A Naasir Carreira  ?01/18/2022 ?2:49 PM ? ? ?Chart reviewed for nurse visit. Agree with plan of care.  ?Cheral Marker, CNM ?01/18/2022 3:20 PM   ?

## 2022-01-19 ENCOUNTER — Ambulatory Visit: Payer: Medicaid Other

## 2022-01-24 ENCOUNTER — Telehealth: Payer: BC Managed Care – PPO | Admitting: Physician Assistant

## 2022-01-24 DIAGNOSIS — B379 Candidiasis, unspecified: Secondary | ICD-10-CM | POA: Diagnosis not present

## 2022-01-24 DIAGNOSIS — B9689 Other specified bacterial agents as the cause of diseases classified elsewhere: Secondary | ICD-10-CM

## 2022-01-24 DIAGNOSIS — T3695XA Adverse effect of unspecified systemic antibiotic, initial encounter: Secondary | ICD-10-CM

## 2022-01-24 DIAGNOSIS — J019 Acute sinusitis, unspecified: Secondary | ICD-10-CM | POA: Diagnosis not present

## 2022-01-24 DIAGNOSIS — J028 Acute pharyngitis due to other specified organisms: Secondary | ICD-10-CM | POA: Diagnosis not present

## 2022-01-24 MED ORDER — DOXYCYCLINE HYCLATE 100 MG PO TABS: 100.0000 mg | ORAL_TABLET | Freq: Two times a day (BID) | ORAL | 0 refills | Status: DC

## 2022-01-24 MED ORDER — FLUCONAZOLE 150 MG PO TABS: 150.0000 mg | ORAL_TABLET | Freq: Once | ORAL | 0 refills | Status: AC

## 2022-01-24 MED ORDER — IPRATROPIUM BROMIDE 0.03 % NA SOLN
2.0000 | Freq: Two times a day (BID) | NASAL | 0 refills | Status: AC
Start: 2022-01-24 — End: ?

## 2022-01-24 NOTE — Progress Notes (Signed)
?Virtual Visit Consent  ? ?Gail Fields, you are scheduled for a virtual visit with a [facility] provider today.   ?  ?Just as with appointments in the office, your consent must be obtained to participate.  Your consent will be active for this visit and any virtual visit you may have with one of our providers in the next 365 days.   ?  ?If you have a MyChart account, a copy of this consent can be sent to you electronically.  All virtual visits are billed to your insurance company just like a traditional visit in the office.   ? ?As this is a virtual visit, video technology does not allow for your provider to perform a traditional examination.  This may limit your provider's ability to fully assess your condition.  If your provider identifies any concerns that need to be evaluated in person or the need to arrange testing (such as labs, EKG, etc.), we will make arrangements to do so.   ?  ?Although advances in technology are sophisticated, we cannot ensure that it will always work on either your end or our end.  If the connection with a video visit is poor, the visit may have to be switched to a telephone visit.  With either a video or telephone visit, we are not always able to ensure that we have a secure connection.    ? ?I need to obtain your verbal consent now.   Are you willing to proceed with your visit today?  ?  ?Gail Fields has provided verbal consent on 01/24/2022 for a virtual visit (video or telephone). ?  ?Gail Daring, PA-C  ? ?Date: 01/24/2022 8:22 AM ? ? ?Virtual Visit via Video Note  ? ?IMar Fields, connected with  Gail Fields  ([id number], Nov 18, 1985) on 01/24/22 at  8:15 AM EDT by a video-enabled telemedicine application and verified that I am speaking with the correct person using two identifiers. ? ?Location: ?Patient: Virtual Visit Location Patient: Home ?Provider: Virtual Visit Location Provider: Home Office ?  ?I discussed the limitations of evaluation and  management by telemedicine and the availability of in person appointments. The patient expressed understanding and agreed to proceed.   ? ?History of Present Illness: ?Gail Fields is a 36 y.o. who identifies as a female who was assigned female at birth, and is being seen today for sore throat. ? ?HPI: Sore Throat  ?This is a recurrent problem. The current episode started 1 to 4 weeks ago. The problem has been gradually worsening. There has been no fever. The pain is mild. Associated symptoms include congestion, coughing and swollen glands. Pertinent negatives include no diarrhea, ear discharge, ear pain, headaches, hoarse voice, plugged ear sensation or trouble swallowing. Associated symptoms comments: Rhinorrhea and post nasal drainage. She has had exposure to strep. Treatments tried: PCN 500. The treatment provided no relief.   ? ? ?Problems:  ?Patient Active Problem List  ? Diagnosis Date Noted  ? UTI (urinary tract infection) 12/08/2020  ? Anal skin tag 06/02/2020  ? Rectal pain 09/14/2019  ? Rectal bleeding 09/10/2019  ? GERD (gastroesophageal reflux disease) 09/10/2019  ? Prolapsed hemorrhoids, grade 4, s/p ligation/pexy/hemorrhoidectomy 04/24/2018 04/24/2018  ? Post concussive syndrome 10/26/2017  ? Dyspareunia due to medical condition in female 12/06/2016  ? Abnormal Pap smear of cervix 01/20/2015  ? Hemorrhoids 10/13/2014  ? Polyp at cervical os 10/13/2014  ? Patellofemoral stress syndrome 10/13/2014  ?  ?Allergies: No Known Allergies ?  Medications:  ?Current Outpatient Medications:  ?  doxycycline (VIBRA-TABS) 100 MG tablet, Take 1 tablet (100 mg total) by mouth 2 (two) times daily., Disp: 20 tablet, Rfl: 0 ?  fluconazole (DIFLUCAN) 150 MG tablet, Take 1 tablet (150 mg total) by mouth once for 1 dose. May repeat in 72 hours if needed, Disp: 2 tablet, Rfl: 0 ?  ipratropium (ATROVENT) 0.03 % nasal spray, Place 2 sprays into both nostrils every 12 (twelve) hours., Disp: 30 mL, Rfl: 0 ?  cephALEXin  (KEFLEX) 500 MG capsule, Take 1 capsule (500 mg total) by mouth 2 (two) times daily., Disp: 14 capsule, Rfl: 0 ?  fexofenadine-pseudoephedrine (ALLEGRA-D ALLERGY & CONGESTION) 60-120 MG 12 hr tablet, Take 1 tablet by mouth 2 (two) times daily., Disp: 30 tablet, Rfl: 3 ?  fluticasone (FLONASE) 50 MCG/ACT nasal spray, Place 2 sprays into both nostrils daily., Disp: 16 g, Rfl: 6 ?  medroxyPROGESTERone (DEPO-PROVERA) 150 MG/ML injection, Inject 1 mL (150 mg total) into the muscle every 3 (three) months., Disp: 1 mL, Rfl: 5 ?  mometasone (NASONEX) 50 MCG/ACT nasal spray, Place 2 sprays into the nose daily., Disp: 1 each, Rfl: 0 ?  omeprazole (PRILOSEC) 20 MG capsule, 1 PO 30 MINS PRIOR TO BREAKFAST., Disp: 90 capsule, Rfl: 3 ?  polyethylene glycol (MIRALAX / GLYCOLAX) 17 g packet, Take 17 g by mouth daily. (Patient not taking: Reported on 03/29/2021), Disp: , Rfl:  ?No current facility-administered medications for this visit. ? ?Facility-Administered Medications Ordered in Other Visits:  ?  lidocaine 2 % w/epi 1:200,000 (20 ml) with sod bicarb 8.4 % (2 ml) inj, , , Continuous PRN, Alvy Bimler, CRNA, 5 mL at 08/18/15 2327 ? ?Observations/Objective: ?Patient is well-developed, well-nourished in no acute distress.  ?Resting comfortably  ?Head is normocephalic, atraumatic.  ?No labored breathing.  ?Speech is clear and coherent with logical content.  ?Patient is alert and oriented at baseline.  ? ? ?Assessment and Plan: ?1. Acute bacterial sinusitis ?- doxycycline (VIBRA-TABS) 100 MG tablet; Take 1 tablet (100 mg total) by mouth 2 (two) times daily.  Dispense: 20 tablet; Refill: 0 ?- ipratropium (ATROVENT) 0.03 % nasal spray; Place 2 sprays into both nostrils every 12 (twelve) hours.  Dispense: 30 mL; Refill: 0 ? ?2. Acute bacterial pharyngitis ?- doxycycline (VIBRA-TABS) 100 MG tablet; Take 1 tablet (100 mg total) by mouth 2 (two) times daily.  Dispense: 20 tablet; Refill: 0 ? ?3. Antibiotic-induced yeast infection ?-  fluconazole (DIFLUCAN) 150 MG tablet; Take 1 tablet (150 mg total) by mouth once for 1 dose. May repeat in 72 hours if needed  Dispense: 2 tablet; Refill: 0 ? ?- Worsening symptoms that have not responded to OTC and prescription medications.  ?- Will give Doxycycline and Ipratropium  ?- Continue allergy medications.  ?- Diflucan given as prophylaxis as patient tends to get vaginal yeast infections with antibiotic use. ?- Stay well hydrated and get plenty of rest.  ?- Seek in person evaluation if no symptom improvement or if symptoms worsen. ? ?Follow Up Instructions: ?I discussed the assessment and treatment plan with the patient. The patient was provided an opportunity to ask questions and all were answered. The patient agreed with the plan and demonstrated an understanding of the instructions.  A copy of instructions were sent to the patient via MyChart unless otherwise noted below.  ? ? ?The patient was advised to call back or seek an in-person evaluation if the symptoms worsen or if the condition fails to improve as  anticipated. ? ?Time:  ?I spent 12 minutes with the patient via telehealth technology discussing the above problems/concerns.   ? ?Gail Daring, PA-C ?

## 2022-01-24 NOTE — Patient Instructions (Signed)
?Rene Kocher, thank you for joining Margaretann Loveless, PA-C for today's virtual visit.  While this provider is not your primary care provider (PCP), if your PCP is located in our provider database this encounter information will be shared with them immediately following your visit. ? ?Consent: ?(Patient) Gail Fields provided verbal consent for this virtual visit at the beginning of the encounter. ? ?Current Medications: ? ?Current Outpatient Medications:  ?  doxycycline (VIBRA-TABS) 100 MG tablet, Take 1 tablet (100 mg total) by mouth 2 (two) times daily., Disp: 20 tablet, Rfl: 0 ?  fluconazole (DIFLUCAN) 150 MG tablet, Take 1 tablet (150 mg total) by mouth once for 1 dose. May repeat in 72 hours if needed, Disp: 2 tablet, Rfl: 0 ?  ipratropium (ATROVENT) 0.03 % nasal spray, Place 2 sprays into both nostrils every 12 (twelve) hours., Disp: 30 mL, Rfl: 0 ?  cephALEXin (KEFLEX) 500 MG capsule, Take 1 capsule (500 mg total) by mouth 2 (two) times daily., Disp: 14 capsule, Rfl: 0 ?  fexofenadine-pseudoephedrine (ALLEGRA-D ALLERGY & CONGESTION) 60-120 MG 12 hr tablet, Take 1 tablet by mouth 2 (two) times daily., Disp: 30 tablet, Rfl: 3 ?  fluticasone (FLONASE) 50 MCG/ACT nasal spray, Place 2 sprays into both nostrils daily., Disp: 16 g, Rfl: 6 ?  medroxyPROGESTERone (DEPO-PROVERA) 150 MG/ML injection, Inject 1 mL (150 mg total) into the muscle every 3 (three) months., Disp: 1 mL, Rfl: 5 ?  mometasone (NASONEX) 50 MCG/ACT nasal spray, Place 2 sprays into the nose daily., Disp: 1 each, Rfl: 0 ?  omeprazole (PRILOSEC) 20 MG capsule, 1 PO 30 MINS PRIOR TO BREAKFAST., Disp: 90 capsule, Rfl: 3 ?  polyethylene glycol (MIRALAX / GLYCOLAX) 17 g packet, Take 17 g by mouth daily. (Patient not taking: Reported on 03/29/2021), Disp: , Rfl:  ?No current facility-administered medications for this visit. ? ?Facility-Administered Medications Ordered in Other Visits:  ?  lidocaine 2 % w/epi 1:200,000 (20 ml) with sod  bicarb 8.4 % (2 ml) inj, , , Continuous PRN, Orlie Pollen, CRNA, 5 mL at 08/18/15 2327  ? ?Medications ordered in this encounter:  ?Meds ordered this encounter  ?Medications  ? doxycycline (VIBRA-TABS) 100 MG tablet  ?  Sig: Take 1 tablet (100 mg total) by mouth 2 (two) times daily.  ?  Dispense:  20 tablet  ?  Refill:  0  ?  Order Specific Question:   Supervising Provider  ?  Answer:   Eber Hong [[id number]]  ? ipratropium (ATROVENT) 0.03 % nasal spray  ?  Sig: Place 2 sprays into both nostrils every 12 (twelve) hours.  ?  Dispense:  30 mL  ?  Refill:  0  ?  Order Specific Question:   Supervising Provider  ?  Answer:   Eber Hong [[id number]]  ? fluconazole (DIFLUCAN) 150 MG tablet  ?  Sig: Take 1 tablet (150 mg total) by mouth once for 1 dose. May repeat in 72 hours if needed  ?  Dispense:  2 tablet  ?  Refill:  0  ?  Order Specific Question:   Supervising Provider  ?  Answer:   Eber Hong [[id number]]  ?  ? ?*If you need refills on other medications prior to your next appointment, please contact your pharmacy* ? ?Follow-Up: ?Call back or seek an in-person evaluation if the symptoms worsen or if the condition fails to improve as anticipated. ? ?Other Instructions ? ?Sinusitis, Adult ?Sinusitis is inflammation of your sinuses. Sinuses are hollow  spaces in the bones around your face. Your sinuses are located: ?Around your eyes. ?In the middle of your forehead. ?Behind your nose. ?In your cheekbones. ?Mucus normally drains out of your sinuses. When your nasal tissues become inflamed or swollen, mucus can become trapped or blocked. This allows bacteria, viruses, and fungi to grow, which leads to infection. Most infections of the sinuses are caused by a virus. ?Sinusitis can develop quickly. It can last for up to 4 weeks (acute) or for more than 12 weeks (chronic). Sinusitis often develops after a cold. ?What are the causes? ?This condition is caused by anything that creates swelling in the sinuses or stops mucus from  draining. This includes: ?Allergies. ?Asthma. ?Infection from bacteria or viruses. ?Deformities or blockages in your nose or sinuses. ?Abnormal growths in the nose (nasal polyps). ?Pollutants, such as chemicals or irritants in the air. ?Infection from fungi (rare). ?What increases the risk? ?You are more likely to develop this condition if you: ?Have a weak body defense system (immune system). ?Do a lot of swimming or diving. ?Overuse nasal sprays. ?Smoke. ?What are the signs or symptoms? ?The main symptoms of this condition are pain and a feeling of pressure around the affected sinuses. Other symptoms include: ?Stuffy nose or congestion. ?Thick drainage from your nose. ?Swelling and warmth over the affected sinuses. ?Headache. ?Upper toothache. ?A cough that may get worse at night. ?Extra mucus that collects in the throat or the back of the nose (postnasal drip). ?Decreased sense of smell and taste. ?Fatigue. ?A fever. ?Sore throat. ?Bad breath. ?How is this diagnosed? ?This condition is diagnosed based on: ?Your symptoms. ?Your medical history. ?A physical exam. ?Tests to find out if your condition is acute or chronic. This may include: ?Checking your nose for nasal polyps. ?Viewing your sinuses using a device that has a light (endoscope). ?Testing for allergies or bacteria. ?Imaging tests, such as an MRI or CT scan. ?In rare cases, a bone biopsy may be done to rule out more serious types of fungal sinus disease. ?How is this treated? ?Treatment for sinusitis depends on the cause and whether your condition is chronic or acute. ?If caused by a virus, your symptoms should go away on their own within 10 days. You may be given medicines to relieve symptoms. They include: ?Medicines that shrink swollen nasal passages (topical intranasal decongestants). ?Medicines that treat allergies (antihistamines). ?A spray that eases inflammation of the nostrils (topical intranasal corticosteroids). ?Rinses that help get rid of  thick mucus in your nose (nasal saline washes). ?If caused by bacteria, your health care provider may recommend waiting to see if your symptoms improve. Most bacterial infections will get better without antibiotic medicine. You may be given antibiotics if you have: ?A severe infection. ?A weak immune system. ?If caused by narrow nasal passages or nasal polyps, you may need to have surgery. ?Follow these instructions at home: ?Medicines ?Take, use, or apply over-the-counter and prescription medicines only as told by your health care provider. These may include nasal sprays. ?If you were prescribed an antibiotic medicine, take it as told by your health care provider. Do not stop taking the antibiotic even if you start to feel better. ?Hydrate and humidify ? ?Drink enough fluid to keep your urine pale yellow. Staying hydrated will help to thin your mucus. ?Use a cool mist humidifier to keep the humidity level in your home above 50%. ?Inhale steam for 10-15 minutes, 3-4 times a day, or as told by your health care provider.  You can do this in the bathroom while a hot shower is running. ?Limit your exposure to cool or dry air. ?Rest ?Rest as much as possible. ?Sleep with your head raised (elevated). ?Make sure you get enough sleep each night. ?General instructions ? ?Apply a warm, moist washcloth to your face 3-4 times a day or as told by your health care provider. This will help with discomfort. ?Wash your hands often with soap and water to reduce your exposure to germs. If soap and water are not available, use hand sanitizer. ?Do not smoke. Avoid being around people who are smoking (secondhand smoke). ?Keep all follow-up visits as told by your health care provider. This is important. ?Contact a health care provider if: ?You have a fever. ?Your symptoms get worse. ?Your symptoms do not improve within 10 days. ?Get help right away if: ?You have a severe headache. ?You have persistent vomiting. ?You have severe pain or  swelling around your face or eyes. ?You have vision problems. ?You develop confusion. ?Your neck is stiff. ?You have trouble breathing. ?Summary ?Sinusitis is soreness and inflammation of your sinuses. Sinuses are h

## 2022-03-21 ENCOUNTER — Telehealth: Payer: BC Managed Care – PPO | Admitting: Physician Assistant

## 2022-03-21 DIAGNOSIS — B369 Superficial mycosis, unspecified: Secondary | ICD-10-CM

## 2022-03-21 MED ORDER — KETOCONAZOLE 2 % EX CREA: 1.0000 "application " | TOPICAL_CREAM | Freq: Every day | CUTANEOUS | 0 refills | Status: AC

## 2022-03-21 NOTE — Progress Notes (Signed)
Message sent to patient requesting further input regarding current symptoms. Awaiting patient response.  

## 2022-03-21 NOTE — Progress Notes (Signed)
I have spent 5 minutes in review of e-visit questionnaire, review and updating patient chart, medical decision making and response to patient.   Alvan Culpepper Cody Eissa Buchberger, PA-C    

## 2022-03-21 NOTE — Progress Notes (Signed)
E Visit for Rash ? ?We are sorry that you are not feeling well. Here is how we plan to help! ? ?Based upon your presentation it appears you have a fungal/yeast infection.  I have prescribed: Ketoconazole cream to apply to the area twice daily for 2 weeks. Then as needed. ? ? ?HOME CARE: ? ?Take cool showers and avoid direct sunlight. ?Apply cool compress or wet dressings. ?Take a bath in an oatmeal bath.  Sprinkle content of one Aveeno packet under running faucet with comfortably warm water.  Bathe for 15-20 minutes, 1-2 times daily.  Pat dry with a towel. Do not rub the rash. ?Use hydrocortisone cream. ?Take an antihistamine like Benadryl for widespread rashes that itch.  The adult dose of Benadryl is 25-50 mg by mouth 4 times daily. ?Caution:  This type of medication may cause sleepiness.  Do not drink alcohol, drive, or operate dangerous machinery while taking antihistamines.  Do not take these medications if you have prostate enlargement.  Read package instructions thoroughly on all medications that you take. ? ?GET HELP RIGHT AWAY IF: ? ?Symptoms don't go away after treatment. ?Severe itching that persists. ?If you rash spreads or swells. ?If you rash begins to smell. ?If it blisters and opens or develops a yellow-brown crust. ?You develop a fever. ?You have a sore throat. ?You become short of breath. ? ?MAKE SURE YOU: ? ?Understand these instructions. ?Will watch your condition. ?Will get help right away if you are not doing well or get worse. ? ?Thank you for choosing an e-visit. ? ?Your e-visit answers were reviewed by a board certified advanced clinical practitioner to complete your personal care plan. Depending upon the condition, your plan could have included both over the counter or prescription medications. ? ?Please review your pharmacy choice. Make sure the pharmacy is open so you can pick up prescription now. If there is a problem, you may contact your provider through [employer] and have the  prescription routed to another pharmacy.  Your safety is important to [place]. If you have drug allergies check your prescription carefully.  ? ?For the next 24 hours you can use MyChart to ask questions about today's visit, request a non-urgent call back, or ask for a work or school excuse. ?You will get an email in the next two days asking about your experience. I hope that your e-visit has been valuable and will speed your recovery. ? ?

## 2022-04-12 ENCOUNTER — Ambulatory Visit: Payer: Medicaid Other

## 2022-04-20 ENCOUNTER — Ambulatory Visit (INDEPENDENT_AMBULATORY_CARE_PROVIDER_SITE_OTHER): Payer: BC Managed Care – PPO | Admitting: *Deleted

## 2022-04-20 DIAGNOSIS — Z3042 Encounter for surveillance of injectable contraceptive: Secondary | ICD-10-CM

## 2022-04-20 MED ORDER — MEDROXYPROGESTERONE ACETATE 150 MG/ML IM SUSP
150.0000 mg | Freq: Once | INTRAMUSCULAR | Status: AC
  Administered 2022-04-20: 150 mg via INTRAMUSCULAR

## 2022-04-20 NOTE — Progress Notes (Signed)
   NURSE VISIT- INJECTION  SUBJECTIVE:  Gail Fields is a 36 y.o. [phone] female here for a Depo Provera for contraception/period management. She is a GYN patient.   OBJECTIVE:  There were no vitals taken for this visit.  Appears well, in no apparent distress  Injection administered in: Right deltoid  Meds ordered this encounter  Medications   medroxyPROGESTERone (DEPO-PROVERA) injection 150 mg    ASSESSMENT: GYN patient Depo Provera for contraception/period management PLAN: Follow-up: in 11-13 weeks for next Depo   Jobe Marker  04/20/2022 10:33 AM

## 2022-06-21 ENCOUNTER — Other Ambulatory Visit (HOSPITAL_COMMUNITY)
Admission: RE | Admit: 2022-06-21 | Discharge: 2022-06-21 | Disposition: A | Discharge status: Home / Self Care | Payer: BC Managed Care – PPO | Source: Ambulatory Visit | Attending: Obstetrics & Gynecology | Admitting: Obstetrics & Gynecology

## 2022-06-21 ENCOUNTER — Other Ambulatory Visit (INDEPENDENT_AMBULATORY_CARE_PROVIDER_SITE_OTHER): Payer: BC Managed Care – PPO | Admitting: *Deleted

## 2022-06-21 ENCOUNTER — Other Ambulatory Visit: Payer: Self-pay | Admitting: Adult Health

## 2022-06-21 DIAGNOSIS — N898 Other specified noninflammatory disorders of vagina: Secondary | ICD-10-CM

## 2022-06-21 DIAGNOSIS — B3731 Acute candidiasis of vulva and vagina: Secondary | ICD-10-CM | POA: Insufficient documentation

## 2022-06-21 MED ORDER — FLUCONAZOLE 150 MG PO TABS: ORAL_TABLET | ORAL | 1 refills | Status: DC

## 2022-06-21 NOTE — Progress Notes (Signed)
Will rx diflucan  

## 2022-06-21 NOTE — Progress Notes (Signed)
   NURSE VISIT- VAGINITIS/STD  SUBJECTIVE:  Gail Fields is a 36 y.o. [phone] GYN patientfemale here for a vaginal swab for vaginitis screening, STD screen.  She reports the following symptoms:  vaginal irritation and discharge  for 3 days. Denies abnormal vaginal bleeding, significant pelvic pain, fever, or UTI symptoms.  OBJECTIVE:  There were no vitals taken for this visit.  Appears well, in no apparent distress  ASSESSMENT: Vaginal swab for vaginitis screening & STD screening.  PLAN: Self-collected vaginal probe for Gonorrhea, Chlamydia, Trichomonas, Bacterial Vaginosis, Yeast sent to lab Treatment: to be determined once results are received. Pt is requesting Diflucan be sent in for irritation.  Follow-up as needed if symptoms persist/worsen, or new symptoms develop  [name]  06/21/2022 2:09 PM

## 2022-06-22 LAB — CERVICOVAGINAL ANCILLARY ONLY
Bacterial Vaginitis (gardnerella): NEGATIVE
Candida Glabrata: NEGATIVE
Candida Vaginitis: POSITIVE — AB
Chlamydia: NEGATIVE
Comment: NEGATIVE
Comment: NEGATIVE
Comment: NEGATIVE
Comment: NEGATIVE
Comment: NEGATIVE
Comment: NORMAL
Neisseria Gonorrhea: NEGATIVE
Trichomonas: NEGATIVE

## 2022-07-01 ENCOUNTER — Other Ambulatory Visit: Payer: Self-pay | Admitting: Obstetrics & Gynecology

## 2022-07-13 ENCOUNTER — Ambulatory Visit (INDEPENDENT_AMBULATORY_CARE_PROVIDER_SITE_OTHER): Payer: BC Managed Care – PPO | Admitting: *Deleted

## 2022-07-13 DIAGNOSIS — Z3042 Encounter for surveillance of injectable contraceptive: Secondary | ICD-10-CM | POA: Diagnosis not present

## 2022-07-13 MED ORDER — MEDROXYPROGESTERONE ACETATE 150 MG/ML IM SUSP
150.0000 mg | Freq: Once | INTRAMUSCULAR | Status: AC
  Administered 2022-07-13: 150 mg via INTRAMUSCULAR

## 2022-07-13 NOTE — Progress Notes (Signed)
   NURSE VISIT- INJECTION  SUBJECTIVE:  Gail Fields is a 36 y.o. [phone] female here for a Depo Provera for contraception/period management. She is a GYN patient.   OBJECTIVE:  There were no vitals taken for this visit.  Appears well, in no apparent distress  Injection administered in: Right deltoid  Meds ordered this encounter  Medications   medroxyPROGESTERone (DEPO-PROVERA) injection 150 mg    ASSESSMENT: GYN patient Depo Provera for contraception/period management PLAN: Follow-up: in 11-13 weeks for next Depo   Jobe Marker  07/13/2022 4:16 PM

## 2022-08-02 ENCOUNTER — Ambulatory Visit (INDEPENDENT_AMBULATORY_CARE_PROVIDER_SITE_OTHER): Payer: BC Managed Care – PPO | Admitting: Obstetrics & Gynecology

## 2022-08-02 ENCOUNTER — Other Ambulatory Visit (HOSPITAL_COMMUNITY)
Admission: RE | Admit: 2022-08-02 | Discharge: 2022-08-02 | Disposition: A | Discharge status: Home / Self Care | Payer: BC Managed Care – PPO | Source: Ambulatory Visit | Attending: Obstetrics & Gynecology | Admitting: Obstetrics & Gynecology

## 2022-08-02 ENCOUNTER — Encounter: Payer: Self-pay | Admitting: Obstetrics & Gynecology

## 2022-08-02 VITALS — BP 111/70 | HR 68 | Ht 66.5 in | Wt 182.0 lb

## 2022-08-02 DIAGNOSIS — Z3042 Encounter for surveillance of injectable contraceptive: Secondary | ICD-10-CM

## 2022-08-02 DIAGNOSIS — Z01419 Encounter for gynecological examination (general) (routine) without abnormal findings: Secondary | ICD-10-CM | POA: Diagnosis present

## 2022-08-02 MED ORDER — MEDROXYPROGESTERONE ACETATE 150 MG/ML IM SUSP: 150.0000 mg | INTRAMUSCULAR | 5 refills | Status: DC

## 2022-08-02 NOTE — Progress Notes (Unsigned)
   WELL-WOMAN EXAMINATION Patient name: Gail Fields MRN [id number]  Date of birth: 1985-11-18 Chief Complaint:   Gynecologic Exam  History of Present Illness:   Gail Fields is a 36 y.o. [phone] female being seen today for a routine well-woman exam.  Today she notes slight vaginal itching s/p recent antibiotic treatment for her tooth.  Denies irregular discharge.   No LMP recorded. Patient has had an injection. No period with current form of contraception The current method of family planning is Depo-Provera injections.   Last pap ASCUS-H, HPV+, colpo 03/2021 CIN 1.  Last mammogram: n/a. Last colonoscopy: n/a     12/08/2020    3:01 PM 07/30/2019    9:08 AM 04/17/2017   10:18 AM 11/20/2016    3:32 PM  Depression screen PHQ 2/9  Decreased Interest 0 0 0 0  Down, Depressed, Hopeless 0 0 0 0  PHQ - 2 Score 0 0 0 0  Altered sleeping 0  0   Tired, decreased energy 1  1   Change in appetite 0  0   Feeling bad or failure about yourself  0  0   Trouble concentrating 0  0   Moving slowly or fidgety/restless 0     Suicidal thoughts 0  0   PHQ-9 Score 1  1       Review of Systems:   Pertinent items are noted in HPI Denies any headaches, blurred vision, fatigue, shortness of breath, chest pain, abdominal pain, bowel movements, urination, or intercourse unless otherwise stated above.  Pertinent History Reviewed:  Reviewed past medical,surgical, social and family history.  Reviewed problem list, medications and allergies. Physical Assessment:   Vitals:   08/02/22 1420  BP: 111/70  Pulse: 68  Weight: 182 lb (82.6 kg)  Height: 5' 6.5" (1.689 m)  Body mass index is 28.94 kg/m.        Physical Examination:   General appearance - well appearing, and in no distress  Mental status - alert, oriented to person, place, and time  Psych:  She has a normal mood and affect  Skin - warm and dry, normal color, no suspicious lesions noted  Chest - effort normal, all lung fields  clear to auscultation bilaterally  Heart - normal rate and regular rhythm  Neck:  midline trachea, no thyromegaly or nodules  Breasts - breasts appear normal, no suspicious masses, no skin or nipple changes or  axillary nodes  Abdomen - soft, nontender, nondistended, no masses or organomegaly  Pelvic - VULVA: normal appearing vulva with no masses, tenderness or lesions  VAGINA: normal appearing vagina with normal color and discharge, no lesions  CERVIX: normal appearing cervix without discharge or lesions, no CMT  Thin prep pap is done with HR HPV cotesting  UTERUS: uterus is felt to be normal size, shape, consistency and nontender   ADNEXA: No adnexal masses or tenderness noted.  Extremities:  No swelling or varicosities noted  Chaperone: Celene Squibb     Assessment & Plan:  1) Well-Woman Exam -pap collected, reviewed ASCCP guidelines  2) Contraceptive management -for now plan to continue  No orders of the defined types were placed in this encounter.   Meds: No orders of the defined types were placed in this encounter.   Follow-up: No follow-ups on file.   Janyth Pupa, DO Attending [place], [facility] for [employer], [place]

## 2022-08-09 LAB — CYTOLOGY - PAP
Chlamydia: NEGATIVE
Comment: NEGATIVE
Comment: NEGATIVE
Comment: NEGATIVE
Comment: NEGATIVE
Comment: NORMAL
Diagnosis: NEGATIVE
HPV 16: NEGATIVE
HPV 18 / 45: NEGATIVE
High risk HPV: POSITIVE — AB
Neisseria Gonorrhea: NEGATIVE

## 2022-10-02 ENCOUNTER — Ambulatory Visit (INDEPENDENT_AMBULATORY_CARE_PROVIDER_SITE_OTHER): Payer: BC Managed Care – PPO | Admitting: *Deleted

## 2022-10-02 DIAGNOSIS — Z3042 Encounter for surveillance of injectable contraceptive: Secondary | ICD-10-CM

## 2022-10-02 MED ORDER — MEDROXYPROGESTERONE ACETATE 150 MG/ML IM SUSP
150.0000 mg | Freq: Once | INTRAMUSCULAR | Status: AC
  Administered 2022-10-02: 150 mg via INTRAMUSCULAR

## 2022-10-02 NOTE — Progress Notes (Signed)
   NURSE VISIT- INJECTION  SUBJECTIVE:  Gail Fields is a 36 y.o. [phone] female here for a Depo Provera for contraception/period management. She is a GYN patient.   OBJECTIVE:  There were no vitals taken for this visit.  Appears well, in no apparent distress  Injection administered in: Left deltoid  Meds ordered this encounter  Medications   medroxyPROGESTERone (DEPO-PROVERA) injection 150 mg    ASSESSMENT: GYN patient Depo Provera for contraception/period management PLAN: Follow-up: in 11-13 weeks for next Depo   Jobe Marker  10/02/2022 3:55 PM

## 2022-12-22 ENCOUNTER — Telehealth: Payer: BC Managed Care – PPO | Admitting: Family Medicine

## 2022-12-22 DIAGNOSIS — J40 Bronchitis, not specified as acute or chronic: Secondary | ICD-10-CM

## 2022-12-22 MED ORDER — AZITHROMYCIN 250 MG PO TABS: ORAL_TABLET | ORAL | 0 refills | Status: AC

## 2022-12-22 MED ORDER — PROMETHAZINE-DM 6.25-15 MG/5ML PO SYRP: 5.0000 mL | ORAL_SOLUTION | Freq: Four times a day (QID) | ORAL | 0 refills | Status: DC | PRN

## 2022-12-22 NOTE — Progress Notes (Signed)
Virtual Visit Consent   Gail Fields, you are scheduled for a virtual visit with a [facility] provider today. Just as with appointments in the office, your consent must be obtained to participate. Your consent will be active for this visit and any virtual visit you may have with one of our providers in the next 365 days. If you have a MyChart account, a copy of this consent can be sent to you electronically.  As this is a virtual visit, video technology does not allow for your provider to perform a traditional examination. This may limit your provider's ability to fully assess your condition. If your provider identifies any concerns that need to be evaluated in person or the need to arrange testing (such as labs, EKG, etc.), we will make arrangements to do so. Although advances in technology are sophisticated, we cannot ensure that it will always work on either your end or our end. If the connection with a video visit is poor, the visit may have to be switched to a telephone visit. With either a video or telephone visit, we are not always able to ensure that we have a secure connection.  By engaging in this virtual visit, you consent to the provision of healthcare and authorize for your insurance to be billed (if applicable) for the services provided during this visit. Depending on your insurance coverage, you may receive a charge related to this service.  I need to obtain your verbal consent now. Are you willing to proceed with your visit today? [name] has provided verbal consent on 12/22/2022 for a virtual visit (video or telephone). Dellia Nims, FNP  Date: 12/22/2022 11:25 AM  Virtual Visit via Video Note   I, Dellia Nims, connected with  Shelbylynn Romberg  ([id number], [birth year]) on 12/22/22 at 11:15 AM EST by a video-enabled telemedicine application and verified that I am speaking with the correct person using two identifiers.  Location: Patient: Virtual Visit Location  Patient: Home Provider: Virtual Visit Location Provider: Home Office   I discussed the limitations of evaluation and management by telemedicine and the availability of in person appointments. The patient expressed understanding and agreed to proceed.    History of Present Illness: Gail Fields is a 37 y.o. who identifies as a female who was assigned female at birth, and is being seen today for persistent cough for over 2 weeks. Persistent. Yellow mucus. No wheezing or sob. 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    Allergies: No Known Allergies Medications:  Current Outpatient Medications:    azithromycin (ZITHROMAX) 250 MG tablet, Take 2 tablets on day 1, then 1 tablet daily on days 2 through 5, Disp: 6 tablet, Rfl: 0   promethazine-dextromethorphan (PROMETHAZINE-DM) 6.25-15 MG/5ML syrup, Take 5 mLs by mouth 4 (four) times daily as needed for cough., Disp: 118 mL, Rfl: 0   fluticasone (FLONASE) 50 MCG/ACT nasal spray, Place 2 sprays into both nostrils daily., Disp: 16 g, Rfl: 6  ipratropium (ATROVENT) 0.03 % nasal spray, Place 2 sprays into both nostrils every 12 (twelve) hours., Disp: 30 mL, Rfl: 0   ketoconazole (NIZORAL) 2 % cream, Apply 1 application. topically daily., Disp: 15 g, Rfl: 0   linaclotide (LINZESS) 145 MCG CAPS capsule, Take by mouth., Disp: , Rfl:    medroxyPROGESTERone (DEPO-PROVERA) 150 MG/ML injection, Inject 1 mL (150  mg total) into the muscle every 3 (three) months., Disp: 1 mL, Rfl: 5   omeprazole (PRILOSEC) 20 MG capsule, 1 PO 30 MINS PRIOR TO BREAKFAST., Disp: 90 capsule, Rfl: 3   polyethylene glycol (MIRALAX / GLYCOLAX) 17 g packet, Take 17 g by mouth daily., Disp: , Rfl:  No current facility-administered medications for this visit.  Facility-Administered Medications Ordered in Other Visits:    lidocaine 2 % w/epi 1:200,000 (20 ml) with sod bicarb 8.4 % (2 ml) inj, , , Continuous PRN, Manley Mason, Debra R, CRNA, 5 mL at 08/18/15 2327  Observations/Objective: Patient is well-developed, well-nourished in no acute distress.  Resting comfortably  at home.  Head is normocephalic, atraumatic.  No labored breathing.  Speech is clear and coherent with logical content.  Patient is alert and oriented at baseline.    Assessment and Plan: 1. Bronchitis  Increase fluids, humidifier at night, tylenol or ibuprofen as directed, urgent care if sx persist.   Follow Up Instructions: I discussed the assessment and treatment plan with the patient. The patient was provided an opportunity to ask questions and all were answered. The patient agreed with the plan and demonstrated an understanding of the instructions.  A copy of instructions were sent to the patient via MyChart unless otherwise noted below.     The patient was advised to call back or seek an in-person evaluation if the symptoms worsen or if the condition fails to improve as anticipated.  Time:  I spent 10 minutes with the patient via telehealth technology discussing the above problems/concerns.    Dellia Nims, FNP

## 2022-12-22 NOTE — Patient Instructions (Signed)

## 2022-12-25 ENCOUNTER — Ambulatory Visit: Payer: BC Managed Care – PPO | Admitting: *Deleted

## 2022-12-25 DIAGNOSIS — Z3042 Encounter for surveillance of injectable contraceptive: Secondary | ICD-10-CM | POA: Diagnosis not present

## 2022-12-25 MED ORDER — MEDROXYPROGESTERONE ACETATE 150 MG/ML IM SUSP
150.0000 mg | Freq: Once | INTRAMUSCULAR | Status: AC
  Administered 2022-12-25: 150 mg via INTRAMUSCULAR

## 2022-12-25 NOTE — Progress Notes (Signed)
   NURSE VISIT- INJECTION  SUBJECTIVE:  Gail Fields is a 37 y.o. [phone] female here for a Depo Provera for contraception/period management. She is a GYN patient.   OBJECTIVE:  There were no vitals taken for this visit.  Appears well, in no apparent distress  Injection administered in: Right deltoid  Meds ordered this encounter  Medications   medroxyPROGESTERone (DEPO-PROVERA) injection 150 mg    ASSESSMENT: GYN patient Depo Provera for contraception/period management PLAN: Follow-up: in 11-13 weeks for next Depo   [name]  12/25/2022 3:55 PM

## 2023-03-09 ENCOUNTER — Telehealth: Payer: BC Managed Care – PPO | Admitting: Family Medicine

## 2023-03-09 DIAGNOSIS — N390 Urinary tract infection, site not specified: Secondary | ICD-10-CM | POA: Diagnosis not present

## 2023-03-09 DIAGNOSIS — J301 Allergic rhinitis due to pollen: Secondary | ICD-10-CM | POA: Diagnosis not present

## 2023-03-09 MED ORDER — CEPHALEXIN 500 MG PO CAPS
500.0000 mg | ORAL_CAPSULE | Freq: Three times a day (TID) | ORAL | 0 refills | Status: AC
Start: 2023-03-09 — End: 2023-03-16

## 2023-03-09 NOTE — Patient Instructions (Signed)

## 2023-03-09 NOTE — Progress Notes (Signed)
Virtual Visit Consent   Emanuel Zoch, you are scheduled for a virtual visit with a [facility] provider today. Just as with appointments in the office, your consent must be obtained to participate. Your consent will be active for this visit and any virtual visit you may have with one of our providers in the next 365 days. If you have a MyChart account, a copy of this consent can be sent to you electronically.  As this is a virtual visit, video technology does not allow for your provider to perform a traditional examination. This may limit your provider's ability to fully assess your condition. If your provider identifies any concerns that need to be evaluated in person or the need to arrange testing (such as labs, EKG, etc.), we will make arrangements to do so. Although advances in technology are sophisticated, we cannot ensure that it will always work on either your end or our end. If the connection with a video visit is poor, the visit may have to be switched to a telephone visit. With either a video or telephone visit, we are not always able to ensure that we have a secure connection.  By engaging in this virtual visit, you consent to the provision of healthcare and authorize for your insurance to be billed (if applicable) for the services provided during this visit. Depending on your insurance coverage, you may receive a charge related to this service.  I need to obtain your verbal consent now. Are you willing to proceed with your visit today? Gail Fields has provided verbal consent on 03/09/2023 for a virtual visit (video or telephone). Georgana Curio, FNP  Date: 03/09/2023 9:49 AM  Virtual Visit via Video Note   I, Georgana Curio, connected with  Gail Fields  ([id number], 05-20-1986) on 03/09/23 at  9:45 AM EDT by a video-enabled telemedicine application and verified that I am speaking with the correct person using two identifiers.  Location: Patient: Virtual Visit Location Patient:  Home Provider: Virtual Visit Location Provider: Home Office   I discussed the limitations of evaluation and management by telemedicine and the availability of in person appointments. The patient expressed understanding and agreed to proceed.    History of Present Illness: Gail Fields is a 37 y.o. who identifies as a female who was assigned female at birth, and is being seen today for burning and frequency with urination. No fever. She does have pressure over bladder. She also has allergy drainage taking zyrtec.   HPI: HPI  Problems:  Patient Active Problem List   Diagnosis Date Noted   UTI (urinary tract infection) 12/08/2020   Anal skin tag 06/02/2020   Rectal pain 09/14/2019   Rectal bleeding 09/10/2019   GERD (gastroesophageal reflux disease) 09/10/2019   Prolapsed hemorrhoids, grade 4, s/p ligation/pexy/hemorrhoidectomy 04/24/2018 04/24/2018   Post concussive syndrome 10/26/2017   Dyspareunia due to medical condition in female 12/06/2016   Abnormal Pap smear of cervix 01/20/2015   Hemorrhoids 10/13/2014   Polyp at cervical os 10/13/2014   Patellofemoral stress syndrome 10/13/2014    Allergies: No Known Allergies Medications:  Current Outpatient Medications:    fluticasone (FLONASE) 50 MCG/ACT nasal spray, Place 2 sprays into both nostrils daily., Disp: 16 g, Rfl: 6   ipratropium (ATROVENT) 0.03 % nasal spray, Place 2 sprays into both nostrils every 12 (twelve) hours., Disp: 30 mL, Rfl: 0   ketoconazole (NIZORAL) 2 % cream, Apply 1 application. topically daily., Disp: 15 g, Rfl: 0   linaclotide (LINZESS)  145 MCG CAPS capsule, Take by mouth., Disp: , Rfl:    medroxyPROGESTERone (DEPO-PROVERA) 150 MG/ML injection, Inject 1 mL (150 mg total) into the muscle every 3 (three) months., Disp: 1 mL, Rfl: 5   omeprazole (PRILOSEC) 20 MG capsule, 1 PO 30 MINS PRIOR TO BREAKFAST., Disp: 90 capsule, Rfl: 3   polyethylene glycol (MIRALAX / GLYCOLAX) 17 g packet, Take 17 g by mouth  daily., Disp: , Rfl:    promethazine-dextromethorphan (PROMETHAZINE-DM) 6.25-15 MG/5ML syrup, Take 5 mLs by mouth 4 (four) times daily as needed for cough., Disp: 118 mL, Rfl: 0 No current facility-administered medications for this visit.  Facility-Administered Medications Ordered in Other Visits:    lidocaine 2 % w/epi 1:200,000 (20 ml) with sod bicarb 8.4 % (2 ml) inj, , , Continuous PRN, Lorn Junes, Debra R, CRNA, 5 mL at 08/18/15 2327  Observations/Objective: Patient is well-developed, well-nourished in no acute distress.  Resting comfortably  at home.  Head is normocephalic, atraumatic.  No labored breathing.  Speech is clear and coherent with logical content.  Patient is alert and oriented at baseline.    Assessment and Plan: 1. UTI (urinary tract infection), uncomplicated  Increase fluids, follow up with uc or pcp as needed, add flonase to current regimen.   Follow Up Instructions: I discussed the assessment and treatment plan with the patient. The patient was provided an opportunity to ask questions and all were answered. The patient agreed with the plan and demonstrated an understanding of the instructions.  A copy of instructions were sent to the patient via MyChart unless otherwise noted below.     The patient was advised to call back or seek an in-person evaluation if the symptoms worsen or if the condition fails to improve as anticipated.  Time:  I spent 10 minutes with the patient via telehealth technology discussing the above problems/concerns.    Georgana Curio, FNP

## 2023-03-19 ENCOUNTER — Ambulatory Visit (INDEPENDENT_AMBULATORY_CARE_PROVIDER_SITE_OTHER): Payer: BC Managed Care – PPO | Admitting: *Deleted

## 2023-03-19 DIAGNOSIS — Z3042 Encounter for surveillance of injectable contraceptive: Secondary | ICD-10-CM

## 2023-03-19 MED ORDER — MEDROXYPROGESTERONE ACETATE 150 MG/ML IM SUSY
150.0000 mg | PREFILLED_SYRINGE | Freq: Once | INTRAMUSCULAR | Status: AC
  Administered 2023-03-19: 150 mg via INTRAMUSCULAR

## 2023-03-19 NOTE — Progress Notes (Signed)
   NURSE VISIT- INJECTION  SUBJECTIVE:  Gail Fields is a 37 y.o. [phone] female here for a Depo Provera for contraception/period management. She is a GYN patient.   OBJECTIVE:  There were no vitals taken for this visit.  Appears well, in no apparent distress  Injection administered in: Left deltoid  Meds ordered this encounter  Medications   medroxyPROGESTERone Acetate SUSY 150 mg    ASSESSMENT: GYN patient Depo Provera for contraception/period management PLAN: Follow-up: in 11-13 weeks for next Depo   [name]  03/19/2023 3:44 PM

## 2023-04-04 ENCOUNTER — Telehealth: Payer: BC Managed Care – PPO

## 2023-06-08 ENCOUNTER — Ambulatory Visit (INDEPENDENT_AMBULATORY_CARE_PROVIDER_SITE_OTHER): Payer: BC Managed Care – PPO | Admitting: *Deleted

## 2023-06-08 DIAGNOSIS — Z3042 Encounter for surveillance of injectable contraceptive: Secondary | ICD-10-CM

## 2023-06-08 MED ORDER — MEDROXYPROGESTERONE ACETATE 150 MG/ML IM SUSY
150.0000 mg | PREFILLED_SYRINGE | Freq: Once | INTRAMUSCULAR | Status: AC
  Administered 2023-06-08: 150 mg via INTRAMUSCULAR

## 2023-06-08 NOTE — Progress Notes (Signed)
   NURSE VISIT- INJECTION  SUBJECTIVE:  Gail Fields is a 37 y.o. [phone] female here for a Depo Provera for contraception/period management. She is a GYN patient.   OBJECTIVE:  There were no vitals taken for this visit.  Appears well, in no apparent distress  Injection administered in: Right deltoid  Meds ordered this encounter  Medications   medroxyPROGESTERone Acetate SUSY 150 mg    ASSESSMENT: GYN patient Depo Provera for contraception/period management PLAN: Follow-up: in 11-13 weeks for next Depo   [name]  06/08/2023 12:06 PM

## 2023-08-27 ENCOUNTER — Ambulatory Visit: Payer: BC Managed Care – PPO | Admitting: Obstetrics & Gynecology

## 2023-08-27 ENCOUNTER — Other Ambulatory Visit (HOSPITAL_COMMUNITY)
Admission: RE | Admit: 2023-08-27 | Discharge: 2023-08-27 | Disposition: A | Discharge status: Home / Self Care | Payer: BC Managed Care – PPO | Source: Ambulatory Visit | Attending: Obstetrics & Gynecology | Admitting: Obstetrics & Gynecology

## 2023-08-27 ENCOUNTER — Encounter: Payer: Self-pay | Admitting: Obstetrics & Gynecology

## 2023-08-27 VITALS — BP 117/78 | HR 76 | Ht 66.0 in | Wt 180.0 lb

## 2023-08-27 DIAGNOSIS — R350 Frequency of micturition: Secondary | ICD-10-CM | POA: Diagnosis not present

## 2023-08-27 DIAGNOSIS — Z3042 Encounter for surveillance of injectable contraceptive: Secondary | ICD-10-CM

## 2023-08-27 DIAGNOSIS — Z01419 Encounter for gynecological examination (general) (routine) without abnormal findings: Secondary | ICD-10-CM | POA: Insufficient documentation

## 2023-08-27 DIAGNOSIS — Z01411 Encounter for gynecological examination (general) (routine) with abnormal findings: Secondary | ICD-10-CM | POA: Diagnosis not present

## 2023-08-27 DIAGNOSIS — Z113 Encounter for screening for infections with a predominantly sexual mode of transmission: Secondary | ICD-10-CM

## 2023-08-27 LAB — POCT URINALYSIS DIPSTICK OB
Glucose, UA: NEGATIVE
Ketones, UA: NEGATIVE
Nitrite, UA: NEGATIVE
POC,PROTEIN,UA: NEGATIVE

## 2023-08-27 MED ORDER — MEDROXYPROGESTERONE ACETATE 150 MG/ML IM SUSY
150.0000 mg | PREFILLED_SYRINGE | Freq: Once | INTRAMUSCULAR | Status: AC
  Administered 2023-08-27: 150 mg via INTRAMUSCULAR

## 2023-08-27 NOTE — Addendum Note (Signed)
Addended by: Freddie Apley R on: 08/27/2023 11:14 AM   Modules accepted: Orders

## 2023-08-27 NOTE — Progress Notes (Signed)
Subjective:     Gail Fields is a 37 y.o. female here for a routine exam.  No LMP recorded. Patient has had an injection. [id number] Birth Control Method:  Depo provera Menstrual Calendar(currently): amenorrhea with depo  Current complaints: mucuousy discharge, irritation more than itching no real odor.   Current acute medical issues:  none   Recent Gynecologic History No LMP recorded. Patient has had an injection. Last Pap: 2023,  +HPV Last mammogram: na,    Past Medical History:  Diagnosis Date   Acute cystitis    BV (bacterial vaginosis)    Hemorrhoid    Hx of chlamydia infection    Patellofemoral stress syndrome    Polyp at cervical os    Trichimoniasis    Vaginal Pap smear, abnormal 09/22/14   ASCUS- +HPV   Yeast infection     Past Surgical History:  Procedure Laterality Date   COLONOSCOPY N/A 01/02/2020   Procedure: COLONOSCOPY;  Surgeon: West Bali, MD;  Location: [facility];  Service: Endoscopy;  Laterality: N/A;  11:00am   HEMORRHOID SURGERY N/A 04/24/2018   Procedure: HEMORRHOIDECTOMY;  Surgeon: Karie Soda, MD;  Location: [facility];  Service: General;  Laterality: N/A;   PEXY N/A 04/24/2018   Procedure: HEMORRHOIDAL LIGATION/PEXY;  Surgeon: Karie Soda, MD;  Location: [facility];  Service: General;  Laterality: N/A;   RECTAL EXAM UNDER ANESTHESIA N/A 04/24/2018   Procedure: ANORECTAL EXAM UNDER ANESTHESIA;  Surgeon: Karie Soda, MD;  Location: [facility];  Service: General;  Laterality: N/A;    OB History     Gravida  4   Para  2   Term  2   Preterm      AB  2   Living  2      SAB  1   IAB  1   Ectopic      Multiple  0   Live Births  2           Social History   Socioeconomic History   Marital status: Single    Spouse name: Not on file   Number of children: 2   Years of education: Not on file   Highest education level: Not on file  Occupational History   Not on file  Tobacco Use   Smoking status: Never   Smokeless  tobacco: Never  Vaping Use   Vaping status: Never Used  Substance and Sexual Activity   Alcohol use: No   Drug use: No   Sexual activity: Yes    Birth control/protection: Injection  Other Topics Concern   Not on file  Social History Narrative   Not on file   Social Determinants of Health   Financial Resource Strain: Low Risk  (12/08/2020)   Overall Financial Resource Strain (CARDIA)    Difficulty of Paying Living Expenses: Not hard at all  Food Insecurity: No Food Insecurity (12/08/2020)   Hunger Vital Sign    Worried About Running Out of Food in the Last Year: Never true    Ran Out of Food in the Last Year: Never true  Transportation Needs: No Transportation Needs (12/08/2020)   PRAPARE - Administrator, Civil Service (Medical): No    Lack of Transportation (Non-Medical): No  Physical Activity: Insufficiently Active (12/08/2020)   Exercise Vital Sign    Days of Exercise per Week: 2 days    Minutes of Exercise per Session: 10 min  Stress: No Stress Concern Present (12/08/2020)   [employer]  of Occupational Health - Occupational Stress Questionnaire    Feeling of Stress : Only a little  Social Connections: Unknown (12/08/2020)   Social Connection and Isolation Panel [NHANES]    Frequency of Communication with Friends and Family: More than three times a week    Frequency of Social Gatherings with Friends and Family: Three times a week    Attends Religious Services: 1 to 4 times per year    Active Member of Clubs or Organizations: No    Attends Banker Meetings: Never    Marital Status: Not on file    Family History  Problem Relation Age of Onset   Cancer Maternal Grandmother        breast   Seizures Paternal Grandmother    Colon cancer Neg Hx      Current Outpatient Medications:    fluticasone (FLONASE) 50 MCG/ACT nasal spray, Place 2 sprays into both nostrils daily., Disp: 16 g, Rfl: 6   ipratropium (ATROVENT) 0.03 % nasal spray, Place 2 sprays  into both nostrils every 12 (twelve) hours., Disp: 30 mL, Rfl: 0   ketoconazole (NIZORAL) 2 % cream, Apply 1 application. topically daily., Disp: 15 g, Rfl: 0   linaclotide (LINZESS) 145 MCG CAPS capsule, Take by mouth., Disp: , Rfl:    medroxyPROGESTERone (DEPO-PROVERA) 150 MG/ML injection, Inject 1 mL (150 mg total) into the muscle every 3 (three) months., Disp: 1 mL, Rfl: 5   omeprazole (PRILOSEC) 20 MG capsule, 1 PO 30 MINS PRIOR TO BREAKFAST., Disp: 90 capsule, Rfl: 3   polyethylene glycol (MIRALAX / GLYCOLAX) 17 g packet, Take 17 g by mouth daily., Disp: , Rfl:  No current facility-administered medications for this visit.  Facility-Administered Medications Ordered in Other Visits:    lidocaine 2 % w/epi 1:200,000 (20 ml) with sod bicarb 8.4 % (2 ml) inj, , , Continuous PRN, Lorn Junes, Debra R, CRNA, 5 mL at 08/18/15 2327  Review of Systems  Review of Systems  Constitutional: Negative for fever, chills, weight loss, malaise/fatigue and diaphoresis.  HENT: Negative for hearing loss, ear pain, nosebleeds, congestion, sore throat, neck pain, tinnitus and ear discharge.   Eyes: Negative for blurred vision, double vision, photophobia, pain, discharge and redness.  Respiratory: Negative for cough, hemoptysis, sputum production, shortness of breath, wheezing and stridor.   Cardiovascular: Negative for chest pain, palpitations, orthopnea, claudication, leg swelling and PND.  Gastrointestinal: negative for abdominal pain. Negative for heartburn, nausea, vomiting, diarrhea, constipation, blood in stool and melena.  Genitourinary: Negative for dysuria, urgency, frequency, hematuria and flank pain.  Musculoskeletal: Negative for myalgias, back pain, joint pain and falls.  Skin: Negative for itching and rash.  Neurological: Negative for dizziness, tingling, tremors, sensory change, speech change, focal weakness, seizures, loss of consciousness, weakness and headaches.  Endo/Heme/Allergies: Negative for  environmental allergies and polydipsia. Does not bruise/bleed easily.  Psychiatric/Behavioral: Negative for depression, suicidal ideas, hallucinations, memory loss and substance abuse. The patient is not nervous/anxious and does not have insomnia.        Objective:  Blood pressure 117/78, pulse 76, height 5\' 6"  (1.676 m), weight 180 lb (81.6 kg).   Physical Exam  Vitals reviewed. Constitutional: She is oriented to person, place, and time. She appears well-developed and well-nourished.  HENT:  Head: Normocephalic and atraumatic.        Right Ear: External ear normal.  Left Ear: External ear normal.  Nose: Nose normal.  Mouth/Throat: Oropharynx is clear and moist.  Eyes: Conjunctivae and EOM are normal.  Pupils are equal, round, and reactive to light. Right eye exhibits no discharge. Left eye exhibits no discharge. No scleral icterus.  Neck: Normal range of motion. Neck supple. No tracheal deviation present. No thyromegaly present.  Cardiovascular: Normal rate, regular rhythm, normal heart sounds and intact distal pulses.  Exam reveals no gallop and no friction rub.   No murmur heard. Respiratory: Effort normal and breath sounds normal. No respiratory distress. She has no wheezes. She has no rales. She exhibits no tenderness.  GI: Soft. Bowel sounds are normal. She exhibits no distension and no mass. There is no tenderness. There is no rebound and no guarding.  Genitourinary:  Breasts no masses skin changes or nipple changes bilaterally      Vulva is normal without lesions Vagina is pink moist without discharge Cervix normal in appearance and pap is done, studies sent + urine culture Uterus is normal size shape and contour Adnexa is negative with normal sized ovaries   Musculoskeletal: Normal range of motion. She exhibits no edema and no tenderness.  Neurological: She is alert and oriented to person, place, and time. She has normal reflexes. She displays normal reflexes. No cranial nerve  deficit. She exhibits normal muscle tone. Coordination normal.  Skin: Skin is warm and dry. No rash noted. No erythema. No pallor.  Psychiatric: She has a normal mood and affect. Her behavior is normal. Judgment and thought content normal.       Medications Ordered at today's visit: Meds ordered this encounter  Medications   medroxyPROGESTERone Acetate SUSY 150 mg    Other orders placed at today's visit: Orders Placed This Encounter  Procedures   POC Urinalysis Dipstick OB      Assessment:    Normal Gyn exam.    Plan:    Contraception: Depo-Provera injections. Follow up in: 3 years.     Return in about 3 years (around 08/26/2026) for yearly.

## 2023-08-29 LAB — URINE CULTURE

## 2023-08-30 ENCOUNTER — Ambulatory Visit: Payer: BC Managed Care – PPO

## 2023-08-30 LAB — CYTOLOGY - PAP
Chlamydia: NEGATIVE
Comment: NEGATIVE
Comment: NEGATIVE
Comment: NEGATIVE
Comment: NEGATIVE
Comment: NEGATIVE
Comment: NORMAL
Diagnosis: UNDETERMINED — AB
HPV 16: NEGATIVE
HPV 18 / 45: NEGATIVE
High risk HPV: POSITIVE — AB
Neisseria Gonorrhea: NEGATIVE
Trichomonas: NEGATIVE

## 2023-10-09 ENCOUNTER — Telehealth: Payer: BC Managed Care – PPO | Admitting: Physician Assistant

## 2023-10-09 DIAGNOSIS — B001 Herpesviral vesicular dermatitis: Secondary | ICD-10-CM | POA: Diagnosis not present

## 2023-10-09 MED ORDER — VALACYCLOVIR HCL 1 G PO TABS: 2000.0000 mg | ORAL_TABLET | Freq: Two times a day (BID) | ORAL | 0 refills | Status: AC

## 2023-10-09 NOTE — Progress Notes (Signed)
E-Visit for [employer]  We are sorry that you are not feeling well.  Here is how we plan to help!  Based on what you have shared with me it does look like you have a viral infection.    Most cold sores or fever blisters are small fluid filled blisters around the mouth caused by herpes simplex virus.  The most common strain of the virus causing cold sores is herpes simplex virus 1.  It can be spread by skin contact, sharing eating utensils, or even sharing towels.  Cold sores are contagious to other people until dry. (Approximately 5-7 days).  Wash your hands. You can spread the virus to your eyes through handling your contact lenses after touching the lesions.  Most people experience pain at the sight or tingling sensations in their lips that may begin before the ulcers erupt.  Herpes simplex is treatable but not curable.  It may lie dormant for a long time and then reappear due to stress or prolonged sun exposure.  Many patients have success in treating their cold sores with an over the counter topical called Abreva.  You may apply the cream up to 5 times daily (maximum 10 days) until healing occurs.  If you would like to use an oral antiviral medication to speed the healing of your cold sore, I have sent a prescription to your local pharmacy Valacyclovir 2 gm take one by mouth twice a day for 1 day    HOME CARE:  Wash your hands frequently. Do not pick at or rub the sore. Don't open the blisters. Avoid kissing other people during this time. Avoid sharing drinking glasses, eating utensils, or razors. Do not handle contact lenses unless you have thoroughly washed your hands with soap and warm water! Avoid oral sex during this time.  Herpes from sores on your mouth can spread to your partner's genital area. Avoid contact with anyone who has eczema or a weakened immune system. Cold sores are often triggered by exposure to intense sunlight, use a lip balm containing a sunscreen (SPF 30 or  higher).  GET HELP RIGHT AWAY IF:  Blisters look infected. Blisters occur near or in the eye. Symptoms last longer than 10 days. Your symptoms become worse.  MAKE SURE YOU:  Understand these instructions. Will watch your condition. Will get help right away if you are not doing well or get worse.    Your e-visit answers were reviewed by a board certified advanced clinical practitioner to complete your personal care plan.  Depending upon the condition, your plan could have  Included both over the counter or prescription medications.    Please review your pharmacy choice.  Be sure that the pharmacy you have chosen is open so that you can pick up your prescription now.  If there is a problem you can message your provider in MyChart to have the prescription routed to another pharmacy.    Your safety is important to [place].  If you have drug allergies check our prescription carefully.  For the next 24 hours you can use MyChart to ask questions about today's visit, request a non-urgent call back, or ask for a work or school excuse from your e-visit provider.  You will get an email in the next two days asking about your experience.  I hope that your e-visit has been valuable and will speed your recovery.

## 2023-10-09 NOTE — Progress Notes (Signed)
I have spent 5 minutes in review of e-visit questionnaire, review and updating patient chart, medical decision making and response to patient.   Mia Milan Cody Jacklynn Dehaas, PA-C    

## 2023-11-27 ENCOUNTER — Other Ambulatory Visit: Payer: Self-pay | Admitting: Family Medicine

## 2023-11-27 DIAGNOSIS — N63 Unspecified lump in unspecified breast: Secondary | ICD-10-CM

## 2023-11-30 ENCOUNTER — Ambulatory Visit: Payer: 59 | Admitting: Obstetrics & Gynecology

## 2023-11-30 ENCOUNTER — Encounter: Payer: Self-pay | Admitting: Obstetrics & Gynecology

## 2023-11-30 ENCOUNTER — Other Ambulatory Visit (HOSPITAL_COMMUNITY)
Admission: RE | Admit: 2023-11-30 | Discharge: 2023-11-30 | Disposition: A | Discharge status: Home / Self Care | Payer: 59 | Source: Ambulatory Visit | Attending: Obstetrics & Gynecology | Admitting: Obstetrics & Gynecology

## 2023-11-30 VITALS — BP 101/66 | HR 68 | Ht 66.5 in | Wt 185.0 lb

## 2023-11-30 DIAGNOSIS — N871 Moderate cervical dysplasia: Secondary | ICD-10-CM

## 2023-11-30 MED ORDER — DROSPIRENONE-ESTETROL 3-14.2 MG PO TABS: 1.0000 | ORAL_TABLET | Freq: Every day | ORAL | 12 refills | Status: AC

## 2023-11-30 NOTE — Progress Notes (Signed)
    Colposcopy Procedure Note:    Colposcopy Procedure Note  Indications:  2024  ASCUS + HPV(neg 16/neg 18) 2023  Normal cytology + HPV(negative 16 + 18/45)    2019 ASCCP recommendation:  Smoker:  No. New sexual partner:  No.  : time frame:  No.  History of abnormal Pap: yes  Procedure Details  The risks and benefits of the procedure and Written informed consent obtained.  Speculum placed in vagina and excellent visualization of cervix achieved, cervix swabbed x 3 with acetic acid solution.  Findings: Adequate colposcopy is noted today.  Cervix: AWE at 9 o'clock at endocervical edge of the cervix, biopsy taken; cervical biopsies taken at 9 o'clock, specimen labelled and sent to pathology, and hemostasis achieved with Monsel's solution. Vaginal inspection: normal without visible lesions. Vulvar colposcopy: vulvar colposcopy not performed.  Specimens: cervical biopsy  Complications: none.  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    Dispense:  28 tablet    Refill:  12    Also has felt a cord lateral in left breast On exam no masses or tenderness, she is feeling her suspensory ligament of the breast  Plan(Based on 2019 ASCCP recommendations) Specimens labelled and sent to Pathology. Will base further treatment on Pathology findings.

## 2023-11-30 NOTE — Addendum Note (Signed)
Addended by: Moss Mc on: 11/30/2023 10:18 AM   Modules accepted: Orders

## 2023-12-03 LAB — SURGICAL PATHOLOGY

## 2023-12-05 ENCOUNTER — Encounter: Payer: Self-pay | Admitting: Obstetrics & Gynecology

## 2023-12-12 ENCOUNTER — Encounter: Payer: Self-pay | Admitting: Obstetrics & Gynecology

## 2024-04-20 ENCOUNTER — Other Ambulatory Visit: Payer: Self-pay | Admitting: Obstetrics & Gynecology

## 2024-04-20 DIAGNOSIS — Z01818 Encounter for other preprocedural examination: Secondary | ICD-10-CM

## 2024-04-21 ENCOUNTER — Other Ambulatory Visit (HOSPITAL_COMMUNITY): Payer: 59

## 2024-04-21 NOTE — Patient Instructions (Signed)
 Gail Fields  04/21/2024     @PREFPERIOPPHARMACY @   Your procedure is scheduled on  04/23/2024.   Report to [facility] at  0720 A.M.   Call this number if you have problems the morning of surgery:  [phone]  If you experience any cold or flu symptoms such as cough, fever, chills, shortness of breath, etc. between now and your scheduled surgery, please notify us  at the above number.   Remember:  Do not eat after midnight.   You may drink clear liquids until 0520 am on 04/23/2024.    Clear liquids allowed are:                    Water, Juice (No red color; non-citric and without pulp; diabetics please choose diet or no sugar options), Carbonated beverages (diabetics please choose diet or no sugar options), Clear Tea (No creamer, milk, or cream, including half & half and powdered creamer), Black Coffee Only (No creamer, milk or cream, including half & half and powdered creamer), and Clear Sports drink (No red color; diabetics please choose diet or no sugar options)           At 0520 am on 04/23/2024 drink your carb drink. You can have nothing else to drink after this.   Take these medicines the morning of surgery with A SIP OF WATER                                               omeprazole .    Do not wear jewelry, make-up or nail polish, including gel polish,  artificial nails, or any other type of covering on natural nails (fingers and  toes).  Do not wear lotions, powders, or perfumes, or deodorant.  Do not shave 48 hours prior to surgery.  Men may shave face and neck.  Do not bring valuables to the hospital.  [facility] is not responsible for any belongings or valuables.  Contacts, dentures or bridgework may not be worn into surgery.  Leave your suitcase in the car.  After surgery it may be brought to your room.  For patients admitted to the hospital, discharge time will be determined by your treatment team.  Patients discharged the day of surgery  will not be allowed to drive home and must have someone with them for 24 hours.    Special instructions:   DO NOT  smoke tobacco or vape for 24 hours before your procedure.  Please read over the following fact sheets that you were given. Coughing and Deep Breathing, Surgical Site Infection Prevention, Anesthesia Post-op Instructions, and Care and Recovery After Surgery      General Anesthesia, Adult, Care After The following information offers guidance on how to care for yourself after your procedure. Your health care provider may also give you more specific instructions. If you have problems or questions, contact your health care provider. What can I expect after the procedure? After the procedure, it is common for people to: Have pain or discomfort at the IV site. Have nausea or vomiting. Have a sore throat or hoarseness. Have trouble concentrating. Feel cold or chills. Feel weak, sleepy, or tired (fatigue). Have soreness and body aches. These can affect parts of the body that were not involved in surgery. Follow these instructions at  home: For the time period you were told by your health care provider:  Rest. Do not participate in activities where you could fall or become injured. Do not drive or use machinery. Do not drink alcohol. Do not take sleeping pills or medicines that cause drowsiness. Do not make important decisions or sign legal documents. Do not take care of children on your own. General instructions Drink enough fluid to keep your urine pale yellow. If you have sleep apnea, surgery and certain medicines can increase your risk for breathing problems. Follow instructions from your health care provider about wearing your sleep device: Anytime you are sleeping, including during daytime naps. While taking prescription pain medicines, sleeping medicines, or medicines that make you drowsy. Return to your normal activities as told by your health care provider. Ask your  health care provider what activities are safe for you. Take over-the-counter and prescription medicines only as told by your health care provider. Do not use any products that contain nicotine or tobacco. These products include cigarettes, chewing tobacco, and vaping devices, such as e-cigarettes. These can delay incision healing after surgery. If you need help quitting, ask your health care provider. Contact a health care provider if: You have nausea or vomiting that does not get better with medicine. You vomit every time you eat or drink. You have pain that does not get better with medicine. You cannot urinate or have bloody urine. You develop a skin rash. You have a fever. Get help right away if: You have trouble breathing. You have chest pain. You vomit blood. These symptoms may be an emergency. Get help right away. Call 911. Do not wait to see if the symptoms will go away. Do not drive yourself to the hospital. Summary After the procedure, it is common to have a sore throat, hoarseness, nausea, vomiting, or to feel weak, sleepy, or fatigue. For the time period you were told by your health care provider, do not drive or use machinery. Get help right away if you have difficulty breathing, have chest pain, or vomit blood. These symptoms may be an emergency. This information is not intended to replace advice given to you by your health care provider. Make sure you discuss any questions you have with your health care provider. Document Revised: 01/20/2022 Document Reviewed: 01/20/2022 Elsevier Patient Education  2024 [employer] Cervical Laser Surgery, Care After After cervical laser surgery, it is common to have: Pain or discomfort. Mild cramping. Bleeding, spotting, or brownish discharge from your vagina. Follow these instructions at home: Activity  Rest as told by your health care provider. Return to your normal activities as told by you provider. Ask your provider what  activities are safe for you. Do not have sex until your provider says it is okay. General instructions Take over-the-counter and prescription medicines only as told by your provider. Ask your provider if the medicine prescribed to you requires you to avoid driving or using machinery. Wear menstrual pads to absorb any bleeding, spotting, and discharge. Do not put anything into your vagina, including tampons or douche, until your provider says it is okay. It is up to you to get the results of your procedure. Ask your provider, or the department that is doing the procedure, when your results will be ready. Your provider may give you more instructions. Make sure you know what you can and cannot do. Contact a health care provider if: Your pain or cramping does not improve. Your periods are more painful than usual. You  do not get your period as expected. Get help right away if: You have any symptoms of infection, such as: A fever. Chills. Discharge that smells bad. You have severe pain in your lower abdomen. You have heavy bleeding from your vagina. A sign of heavy bleeding is that you need to use more than one pad per hour. You have vaginal bleeding with clumps of blood (blood clots). This information is not intended to replace advice given to you by your health care provider. Make sure you discuss any questions you have with your health care provider. Document Revised: 07/04/2022 Document Reviewed: 07/04/2022 Elsevier Patient Education  2024 Elsevier Inc.General Anesthesia, Adult, Care After The following information offers guidance on how to care for yourself after your procedure. Your health care provider may also give you more specific instructions. If you have problems or questions, contact your health care provider. What can I expect after the procedure? After the procedure, it is common for people to: Have pain or discomfort at the IV site. Have nausea or vomiting. Have a sore throat  or hoarseness. Have trouble concentrating. Feel cold or chills. Feel weak, sleepy, or tired (fatigue). Have soreness and body aches. These can affect parts of the body that were not involved in surgery. Follow these instructions at home: For the time period you were told by your health care provider:  Rest. Do not participate in activities where you could fall or become injured. Do not drive or use machinery. Do not drink alcohol. Do not take sleeping pills or medicines that cause drowsiness. Do not make important decisions or sign legal documents. Do not take care of children on your own. General instructions Drink enough fluid to keep your urine pale yellow. If you have sleep apnea, surgery and certain medicines can increase your risk for breathing problems. Follow instructions from your health care provider about wearing your sleep device: Anytime you are sleeping, including during daytime naps. While taking prescription pain medicines, sleeping medicines, or medicines that make you drowsy. Return to your normal activities as told by your health care provider. Ask your health care provider what activities are safe for you. Take over-the-counter and prescription medicines only as told by your health care provider. Do not use any products that contain nicotine or tobacco. These products include cigarettes, chewing tobacco, and vaping devices, such as e-cigarettes. These can delay incision healing after surgery. If you need help quitting, ask your health care provider. Contact a health care provider if: You have nausea or vomiting that does not get better with medicine. You vomit every time you eat or drink. You have pain that does not get better with medicine. You cannot urinate or have bloody urine. You develop a skin rash. You have a fever. Get help right away if: You have trouble breathing. You have chest pain. You vomit blood. These symptoms may be an emergency. Get help right  away. Call 911. Do not wait to see if the symptoms will go away. Do not drive yourself to the hospital. Summary After the procedure, it is common to have a sore throat, hoarseness, nausea, vomiting, or to feel weak, sleepy, or fatigue. For the time period you were told by your health care provider, do not drive or use machinery. Get help right away if you have difficulty breathing, have chest pain, or vomit blood. These symptoms may be an emergency. This information is not intended to replace advice given to you by your health care provider. Make sure you  discuss any questions you have with your health care provider. Document Revised: 01/20/2022 Document Reviewed: 01/20/2022 Elsevier Patient Education  2024 Elsevier Inc.How to Use Chlorhexidine  at Home in the Shower Chlorhexidine  gluconate (CHG) is a germ-killing (antiseptic) wash that's used to clean the skin. It can get rid of the germs that normally live on the skin and can keep them away for about 24 hours. If you're having surgery, you may be told to shower with CHG at home the night before surgery. This can help lower your risk for infection. To use CHG wash in the shower, follow the steps below. Supplies needed: CHG body wash. Clean washcloth. Clean towel. How to use CHG in the shower Follow these steps unless you're told to use CHG in a different way: Start the shower. Use your normal soap and shampoo to wash your face and hair. Turn off the shower or move out of the shower stream. Pour CHG onto a clean washcloth. Do not use any type of brush or rough sponge. Start at your neck, washing your body down to your toes. Make sure you: Wash the part of your body where the surgery will be done for at least 1 minute. Do not scrub. Do not use CHG on your head or face unless your health care provider tells you to. If it gets into your ears or eyes, rinse them well with water. Do not wash your genitals with CHG. Wash your back and under your  arms. Make sure to wash skin folds. Let the CHG sit on your skin for 1-2 minutes or as long as told. Rinse your entire body in the shower, including all body creases and folds. Turn off the shower. Dry off with a clean towel. Do not put anything on your skin afterward, such as powder, lotion, or perfume. Put on clean clothes or pajamas. If it's the night before surgery, sleep in clean sheets. General tips Use CHG only as told, and follow the instructions on the label. Use the full amount of CHG as told. This is often one bottle. Do not smoke and stay away from flames after using CHG. Your skin may feel sticky after using CHG. This is normal. The sticky feeling will go away as the CHG dries. Do not use CHG: If you have a chlorhexidine  allergy or have reacted to chlorhexidine  in the past. On open wounds or areas of skin that have broken skin, cuts, or scrapes. On babies younger than 62 months of age. Contact a health care provider if: You have questions about using CHG. Your skin gets irritated or itchy. You have a rash after using CHG. You swallow any CHG. Call your local poison control center [phone] in the [place] Your eyes itch badly, or they become very red or swollen. Your hearing changes. You have trouble seeing. If you can't reach your provider, go to an urgent care or emergency room. Do not drive yourself. Get help right away if: You have swelling or tingling in your mouth or throat. You make high-pitched whistling sounds when you breathe, most often when you breathe out (wheeze). You have trouble breathing. These symptoms may be an emergency. Call 911 right away. Do not wait to see if the symptoms will go away. Do not drive yourself to the hospital. This information is not intended to replace advice given to you by your health care provider. Make sure you discuss any questions you have with your health care provider. Document Revised: 05/08/2023 Document Reviewed:  05/04/2022  Elsevier Patient Education  2024 [employer].

## 2024-04-22 ENCOUNTER — Encounter (HOSPITAL_COMMUNITY): Payer: Self-pay

## 2024-04-22 ENCOUNTER — Encounter (HOSPITAL_COMMUNITY)
Admission: RE | Admit: 2024-04-22 | Discharge: 2024-04-22 | Disposition: A | Discharge status: Home / Self Care | Payer: 59 | Source: Ambulatory Visit | Attending: Obstetrics & Gynecology

## 2024-04-22 ENCOUNTER — Other Ambulatory Visit: Payer: Self-pay

## 2024-04-22 DIAGNOSIS — Z01818 Encounter for other preprocedural examination: Secondary | ICD-10-CM

## 2024-04-22 DIAGNOSIS — Z01812 Encounter for preprocedural laboratory examination: Secondary | ICD-10-CM | POA: Diagnosis not present

## 2024-04-22 HISTORY — DX: Gastro-esophageal reflux disease without esophagitis: K21.9

## 2024-04-22 LAB — COMPREHENSIVE METABOLIC PANEL WITH GFR
ALT: 18 U/L (ref 0–44)
AST: 17 U/L (ref 15–41)
Albumin: 3.8 g/dL (ref 3.5–5.0)
Alkaline Phosphatase: 41 U/L (ref 38–126)
Anion gap: 9 (ref 5–15)
BUN: 7 mg/dL (ref 6–20)
CO2: 23 mmol/L (ref 22–32)
Calcium: 9.1 mg/dL (ref 8.9–10.3)
Chloride: 106 mmol/L (ref 98–111)
Creatinine, Ser: 0.66 mg/dL (ref 0.44–1.00)
GFR, Estimated: >60 mL/min (ref >60)
Glucose, Bld: 91 mg/dL (ref 70–99)
Potassium: 4 mmol/L (ref 3.5–5.1)
Sodium: 138 mmol/L (ref 135–145)
Total Bilirubin: 1.4 mg/dL — ABNORMAL HIGH (ref 0.0–1.2)
Total Protein: 7.1 g/dL (ref 6.5–8.1)

## 2024-04-22 LAB — CBC
HCT: 39.1 % (ref 36.0–46.0)
Hemoglobin: 13.4 g/dL (ref 12.0–15.0)
MCH: 30.8 pg (ref 26.0–34.0)
MCHC: 34.3 g/dL (ref 30.0–36.0)
MCV: 89.9 fL (ref 80.0–100.0)
Platelets: 215 10*3/uL (ref 150–400)
RBC: 4.35 MIL/uL (ref 3.87–5.11)
RDW: 12.2 % (ref 11.5–15.5)
WBC: 8 10*3/uL (ref 4.0–10.5)
nRBC: 0 % (ref 0.0–0.2)

## 2024-04-22 LAB — URINALYSIS, ROUTINE W REFLEX MICROSCOPIC
Bacteria, UA: NONE SEEN
Bilirubin Urine: NEGATIVE
Glucose, UA: NEGATIVE mg/dL
Hgb urine dipstick: NEGATIVE
Ketones, ur: NEGATIVE mg/dL
Leukocytes,Ua: NEGATIVE
Nitrite: NEGATIVE
Protein, ur: 30 mg/dL — AB
Specific Gravity, Urine: 1.02 (ref 1.005–1.030)
pH: 7 (ref 5.0–8.0)

## 2024-04-22 LAB — RAPID HIV SCREEN (HIV 1/2 AB+AG)
HIV 1/2 Antibodies: NONREACTIVE
HIV-1 P24 Antigen - HIV24: NONREACTIVE

## 2024-04-22 LAB — PREGNANCY, URINE: Preg Test, Ur: NEGATIVE

## 2024-04-23 ENCOUNTER — Ambulatory Visit (HOSPITAL_COMMUNITY): Admitting: Anesthesiology

## 2024-04-23 ENCOUNTER — Ambulatory Visit (HOSPITAL_COMMUNITY)
Admission: RE | Admit: 2024-04-23 | Discharge: 2024-04-23 | Disposition: A | Discharge status: Home / Self Care | Payer: 59 | Attending: Obstetrics & Gynecology | Admitting: Obstetrics & Gynecology

## 2024-04-23 ENCOUNTER — Other Ambulatory Visit: Payer: Self-pay

## 2024-04-23 ENCOUNTER — Encounter (HOSPITAL_COMMUNITY)
Admission: RE | Disposition: A | Discharge status: Home / Self Care | Payer: Self-pay | Source: Home / Self Care | Attending: Obstetrics & Gynecology

## 2024-04-23 ENCOUNTER — Encounter (HOSPITAL_COMMUNITY): Payer: Self-pay | Admitting: Obstetrics & Gynecology

## 2024-04-23 DIAGNOSIS — R87613 High grade squamous intraepithelial lesion on cytologic smear of cervix (HGSIL): Secondary | ICD-10-CM | POA: Insufficient documentation

## 2024-04-23 DIAGNOSIS — I10 Essential (primary) hypertension: Secondary | ICD-10-CM | POA: Diagnosis not present

## 2024-04-23 DIAGNOSIS — Z79899 Other long term (current) drug therapy: Secondary | ICD-10-CM | POA: Diagnosis not present

## 2024-04-23 DIAGNOSIS — N879 Dysplasia of cervix uteri, unspecified: Secondary | ICD-10-CM | POA: Diagnosis not present

## 2024-04-23 DIAGNOSIS — K219 Gastro-esophageal reflux disease without esophagitis: Secondary | ICD-10-CM | POA: Diagnosis not present

## 2024-04-23 HISTORY — PX: CERVICAL ABLATION: SHX5771

## 2024-04-23 SURGERY — ABLATION, CERVIX
Anesthesia: General

## 2024-04-23 MED ORDER — LIDOCAINE 2% (20 MG/ML) 5 ML SYRINGE
INTRAMUSCULAR | Status: AC
  Filled 2024-04-23: qty 5

## 2024-04-23 MED ORDER — MONSELS FERRIC SUBSULFATE EX SOLN
CUTANEOUS | Status: DC | PRN
  Administered 2024-04-23: 1 via TOPICAL

## 2024-04-23 MED ORDER — MIDAZOLAM HCL 2 MG/2ML IJ SOLN
INTRAMUSCULAR | Status: AC
Start: 2024-04-23 — End: 2024-04-23
  Filled 2024-04-23: qty 2

## 2024-04-23 MED ORDER — ONDANSETRON HCL 4 MG/2ML IJ SOLN: 4.0000 mg | Freq: Once | INTRAMUSCULAR | Status: DC | PRN

## 2024-04-23 MED ORDER — DEXAMETHASONE SODIUM PHOSPHATE 10 MG/ML IJ SOLN
INTRAMUSCULAR | Status: DC | PRN
  Administered 2024-04-23: 4 mg via INTRAVENOUS

## 2024-04-23 MED ORDER — FENTANYL CITRATE (PF) 100 MCG/2ML IJ SOLN
INTRAMUSCULAR | Status: AC
Start: 2024-04-23 — End: 2024-04-23
  Filled 2024-04-23: qty 2

## 2024-04-23 MED ORDER — CEFAZOLIN SODIUM-DEXTROSE 2-4 GM/100ML-% IV SOLN
INTRAVENOUS | Status: AC
  Filled 2024-04-23: qty 100

## 2024-04-23 MED ORDER — GLYCOPYRROLATE PF 0.2 MG/ML IJ SOSY
PREFILLED_SYRINGE | INTRAMUSCULAR | Status: DC | PRN
  Administered 2024-04-23 (×2): .1 mg via INTRAVENOUS

## 2024-04-23 MED ORDER — ONDANSETRON HCL 4 MG/2ML IJ SOLN
INTRAMUSCULAR | Status: AC
  Filled 2024-04-23: qty 2

## 2024-04-23 MED ORDER — CHLORHEXIDINE GLUCONATE 0.12 % MT SOLN
15.0000 mL | Freq: Once | OROMUCOSAL | Status: AC
  Administered 2024-04-23: 15 mL via OROMUCOSAL

## 2024-04-23 MED ORDER — ONDANSETRON 8 MG PO TBDP: 8.0000 mg | ORAL_TABLET | Freq: Three times a day (TID) | ORAL | 0 refills | Status: AC | PRN

## 2024-04-23 MED ORDER — KETOROLAC TROMETHAMINE 10 MG PO TABS: 10.0000 mg | ORAL_TABLET | Freq: Three times a day (TID) | ORAL | 0 refills | Status: AC | PRN

## 2024-04-23 MED ORDER — OXYCODONE HCL 5 MG PO TABS: 5.0000 mg | ORAL_TABLET | Freq: Once | ORAL | Status: AC | PRN

## 2024-04-23 MED ORDER — LIDOCAINE 2% (20 MG/ML) 5 ML SYRINGE
INTRAMUSCULAR | Status: DC | PRN
  Administered 2024-04-23: 100 mg via INTRAVENOUS

## 2024-04-23 MED ORDER — WATER FOR IRRIGATION, STERILE IR SOLN
Status: DC | PRN
Start: 2024-04-23 — End: 2024-04-23
  Administered 2024-04-23: 1000 mL via TOPICAL

## 2024-04-23 MED ORDER — KETOROLAC TROMETHAMINE 30 MG/ML IJ SOLN
30.0000 mg | INTRAMUSCULAR | Status: AC
  Administered 2024-04-23: 30 mg via INTRAVENOUS

## 2024-04-23 MED ORDER — ORAL CARE MOUTH RINSE: 15.0000 mL | Freq: Once | OROMUCOSAL | Status: AC

## 2024-04-23 MED ORDER — LACTATED RINGERS IV SOLN: INTRAVENOUS | Status: DC

## 2024-04-23 MED ORDER — FENTANYL CITRATE (PF) 100 MCG/2ML IJ SOLN
INTRAMUSCULAR | Status: DC | PRN
  Administered 2024-04-23 (×2): 50 ug via INTRAVENOUS

## 2024-04-23 MED ORDER — MIDAZOLAM HCL 2 MG/2ML IJ SOLN
INTRAMUSCULAR | Status: DC | PRN
  Administered 2024-04-23: 2 mg via INTRAVENOUS

## 2024-04-23 MED ORDER — GLYCOPYRROLATE PF 0.2 MG/ML IJ SOSY
PREFILLED_SYRINGE | INTRAMUSCULAR | Status: AC
  Filled 2024-04-23: qty 1

## 2024-04-23 MED ORDER — CEFAZOLIN SODIUM-DEXTROSE 2-4 GM/100ML-% IV SOLN
2.0000 g | INTRAVENOUS | Status: AC
  Administered 2024-04-23: 2 g via INTRAVENOUS

## 2024-04-23 MED ORDER — FENTANYL CITRATE PF 50 MCG/ML IJ SOSY: 25.0000 ug | PREFILLED_SYRINGE | INTRAMUSCULAR | Status: DC | PRN

## 2024-04-23 MED ORDER — PROPOFOL 10 MG/ML IV BOLUS
INTRAVENOUS | Status: AC
  Filled 2024-04-23: qty 20

## 2024-04-23 MED ORDER — KETOROLAC TROMETHAMINE 30 MG/ML IJ SOLN
INTRAMUSCULAR | Status: AC
  Filled 2024-04-23: qty 1

## 2024-04-23 MED ORDER — ACETIC ACID 5 % SOLN
Status: DC | PRN
  Administered 2024-04-23: 1 via TOPICAL

## 2024-04-23 MED ORDER — POVIDONE-IODINE 10 % EX SWAB
2.0000 | Freq: Once | CUTANEOUS | Status: AC
  Administered 2024-04-23: 2 via TOPICAL

## 2024-04-23 MED ORDER — HYDROCODONE-ACETAMINOPHEN 5-325 MG PO TABS: 1.0000 | ORAL_TABLET | Freq: Four times a day (QID) | ORAL | 0 refills | Status: AC | PRN

## 2024-04-23 MED ORDER — MONSELS FERRIC SUBSULFATE EX SOLN
CUTANEOUS | Status: AC
  Filled 2024-04-23: qty 8

## 2024-04-23 MED ORDER — OXYCODONE HCL 5 MG/5ML PO SOLN
5.0000 mg | Freq: Once | ORAL | Status: AC | PRN
  Administered 2024-04-23: 5 mg via ORAL
  Filled 2024-04-23: qty 5

## 2024-04-23 MED ORDER — ONDANSETRON HCL 4 MG/2ML IJ SOLN
INTRAMUSCULAR | Status: DC | PRN
  Administered 2024-04-23: 4 mg via INTRAVENOUS

## 2024-04-23 MED ORDER — PROPOFOL 10 MG/ML IV BOLUS
INTRAVENOUS | Status: DC | PRN
  Administered 2024-04-23: 200 mg via INTRAVENOUS
  Administered 2024-04-23: 80 mg via INTRAVENOUS

## 2024-04-23 SURGICAL SUPPLY — 25 items
BAG HAMPER (MISCELLANEOUS) ×1 IMPLANT
CLOTH BEACON ORANGE TIMEOUT ST (SAFETY) ×1 IMPLANT
COVER LIGHT HANDLE STERIS (MISCELLANEOUS) ×2 IMPLANT
COVER MAYO STAND XLG (MISCELLANEOUS) ×1 IMPLANT
GAUZE 4X4 16PLY ~~LOC~~+RFID DBL (SPONGE) ×1 IMPLANT
GLOVE BIO SURGEON STRL SZ 6.5 (GLOVE) IMPLANT
GLOVE BIOGEL PI IND STRL 6.5 (GLOVE) IMPLANT
GLOVE BIOGEL PI IND STRL 7.0 (GLOVE) ×2 IMPLANT
GLOVE BIOGEL PI IND STRL 8 (GLOVE) ×1 IMPLANT
GLOVE ECLIPSE 8.0 STRL XLNG CF (GLOVE) ×1 IMPLANT
GOWN STRL REUS W/TWL LRG LVL3 (GOWN DISPOSABLE) ×1 IMPLANT
GOWN STRL REUS W/TWL XL LVL3 (GOWN DISPOSABLE) ×1 IMPLANT
KIT TURNOVER KIT A (KITS) ×1 IMPLANT
LASER FIBER 1000M SMARTSCOPE (Laser) ×1 IMPLANT
MANIFOLD NEPTUNE II (INSTRUMENTS) ×1 IMPLANT
MARKER SKIN DUAL TIP RULER LAB (MISCELLANEOUS) ×1 IMPLANT
PACK SRG BSC III STRL LF ECLPS (CUSTOM PROCEDURE TRAY) ×1 IMPLANT
PAD ARMBOARD POSITIONER FOAM (MISCELLANEOUS) ×1 IMPLANT
POSITIONER HEAD 8X9X4 ADT (SOFTGOODS) ×1 IMPLANT
SCOPETTES 8 STERILE (MISCELLANEOUS) ×1 IMPLANT
SET BASIN LINEN APH (SET/KITS/TRAYS/PACK) ×1 IMPLANT
SHEET LAVH (DRAPES) ×1 IMPLANT
SWAB PROCTOSCOPIC (MISCELLANEOUS) ×1 IMPLANT
TUBING SMOKE EVAC CO2 (TUBING) ×1 IMPLANT
WATER STERILE IRR 1000ML POUR (IV SOLUTION) ×1 IMPLANT

## 2024-04-23 NOTE — Transfer of Care (Signed)
 Immediate Anesthesia Transfer of Care Note  Patient: Gail Fields  Procedure(s) Performed: ABLATION, CERVIX  Patient Location: PACU  Anesthesia Type:General  Level of Consciousness: drowsy  Airway & Oxygen Therapy: Patient Spontanous Breathing and Patient connected to face mask oxygen  Post-op Assessment: Report given to RN and Post -op Vital signs reviewed and stable  Post vital signs: Reviewed and stable  Last Vitals:  Vitals Value Taken Time  BP 106/63   Temp 97.9   Pulse 73 04/23/24 10:12  Resp 16 04/23/24 10:12  SpO2 100 % 04/23/24 10:12  Vitals shown include unfiled device data.  Last Pain:  Vitals:   04/23/24 0813  TempSrc:   PainSc: 0-No pain         Complications: No notable events documented.

## 2024-04-23 NOTE — Anesthesia Procedure Notes (Addendum)
 Procedure Name: LMA Insertion Date/Time: 04/23/2024 9:46 AM  Performed by: Sherwin Donate, CRNAPre-anesthesia Checklist: Patient identified, Emergency Drugs available, Suction available and Patient being monitored Patient Re-evaluated:Patient Re-evaluated prior to induction Oxygen Delivery Method: Circle system utilized Preoxygenation: Pre-oxygenation with 100% oxygen Induction Type: IV induction Ventilation: Mask ventilation without difficulty LMA: LMA inserted LMA Size: 5.0 Tube type: Oral Number of attempts: 1 Placement Confirmation: positive ETCO2 Tube secured with: Tape Dental Injury: Teeth and Oropharynx as per pre-operative assessment

## 2024-04-23 NOTE — H&P (Signed)
 Preoperative History and Physical  Gail Fields is a 38 y.o. [phone] with No LMP recorded. Patient has had an injection. 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    PMH:    Past Medical History:  Diagnosis Date   Acute cystitis    BV (bacterial vaginosis)    GERD (gastroesophageal reflux disease)    Hemorrhoid    Hx of chlamydia infection    Patellofemoral stress syndrome    Polyp at cervical os    Trichimoniasis    Vaginal Pap smear, abnormal 09/22/2014   ASCUS- +HPV   Yeast infection     PSH:     Past Surgical History:  Procedure Laterality Date   COLONOSCOPY N/A 01/02/2020   Procedure: COLONOSCOPY;  Surgeon: Alyce Jubilee, MD;  Location: [facility];  Service: Endoscopy;  Laterality: N/A;  11:00am   HEMORRHOID SURGERY N/A 04/24/2018   Procedure: HEMORRHOIDECTOMY;  Surgeon: Candyce Champagne, MD;  Location: [facility];  Service: General;  Laterality: N/A;   PEXY N/A 04/24/2018   Procedure: HEMORRHOIDAL LIGATION/PEXY;  Surgeon: Candyce Champagne, MD;  Location: [facility];  Service: General;  Laterality: N/A;   RECTAL EXAM UNDER ANESTHESIA N/A 04/24/2018   Procedure: ANORECTAL EXAM UNDER ANESTHESIA;  Surgeon: Candyce Champagne, MD;  Location: [facility];  Service: General;  Laterality: N/A;    POb/GynH:      OB History     Gravida  4   Para  2   Term  2   Preterm      AB  2   Living  2      SAB  1   IAB  1   Ectopic      Multiple  0   Live Births  2           SH:   Social History   Tobacco Use   Smoking status: Never   Smokeless tobacco: Never  Vaping Use   Vaping status: Never Used  Substance Use Topics   Alcohol use: No   Drug use: No    FH:    Family History  Problem Relation Age of Onset   Cancer Maternal Grandmother        breast   Seizures Paternal Grandmother    Colon cancer Neg Hx      Allergies: No Known Allergies  Medications:       Current Facility-Administered Medications:     ceFAZolin  (ANCEF ) 2-4 GM/100ML-% IVPB, , , ,    ceFAZolin  (ANCEF ) IVPB 2g/100 mL premix, 2 g, Intravenous, On Call to OR, Wendelyn Halter, MD   ketorolac  (TORADOL ) 30 MG/ML injection, , , ,    lactated ringers  infusion, , Intravenous, Continuous, Kiel, Allan Arab, MD  Facility-Administered Medications Ordered in Other Encounters:    lidocaine  2 % w/epi 1:200,000 (20 ml) with sod bicarb 8.4 % (2 ml) inj, , , Continuous PRN, Robert Chimes, Debra R, CRNA, 5 mL at 08/18/15 2327  Review of Systems:   Review of Systems  Constitutional: Negative for fever, chills, weight loss, malaise/fatigue and diaphoresis.  HENT: Negative for hearing loss, ear pain, nosebleeds, congestion, sore throat, neck pain, tinnitus and ear discharge.   Eyes: Negative for blurred vision, double vision, photophobia, pain, discharge and redness.  Respiratory: Negative for cough, hemoptysis, sputum production, shortness of breath, wheezing and stridor.   Cardiovascular: Negative for chest pain, palpitations, orthopnea, claudication, leg swelling and PND.  Gastrointestinal: Positive for abdominal pain.  Negative for heartburn, nausea, vomiting, diarrhea, constipation, blood in stool and melena.  Genitourinary: Negative for dysuria, urgency, frequency, hematuria and flank pain.  Musculoskeletal: Negative for myalgias, back pain, joint pain and falls.  Skin: Negative for itching and rash.  Neurological: Negative for dizziness, tingling, tremors, sensory change, speech change, focal weakness, seizures, loss of consciousness, weakness and headaches.  Endo/Heme/Allergies: Negative for environmental allergies and polydipsia. Does not bruise/bleed easily.  Psychiatric/Behavioral: Negative for depression, suicidal ideas, hallucinations, memory loss and substance abuse. The patient is not nervous/anxious and does not have insomnia.      PHYSICAL EXAM:  Blood pressure 115/71, pulse 70, temperature 98.4 F (36.9 C), temperature source Oral,  resp. rate 17, height 5' 6.5 (1.689 m), weight 83.9 kg, SpO2 100%.    Vitals reviewed. Constitutional: She is oriented to person, place, and time. She appears well-developed and well-nourished.  HENT:  Head: Normocephalic and atraumatic.  Right Ear: External ear normal.  Left Ear: External ear normal.  Nose: Nose normal.  Mouth/Throat: Oropharynx is clear and moist.  Eyes: Conjunctivae and EOM are normal. Pupils are equal, round, and reactive to light. Right eye exhibits no discharge. Left eye exhibits no discharge. No scleral icterus.  Neck: Normal range of motion. Neck supple. No tracheal deviation present. No thyromegaly present.  Cardiovascular: Normal rate, regular rhythm, normal heart sounds and intact distal pulses.  Exam reveals no gallop and no friction rub.   No murmur heard. Respiratory: Effort normal and breath sounds normal. No respiratory distress. She has no wheezes. She has no rales. She exhibits no tenderness.  GI: Soft. Bowel sounds are normal. She exhibits no distension and no mass. There is tenderness. There is no rebound and no guarding.  Genitourinary:       Vulva is normal without lesions Vagina is pink moist without discharge Cervix normal in appearance  Uterus is normal size, contour, position, consistency, mobility, non-tender Adnexa is negative with normal sized ovaries by sonogram  Musculoskeletal: Normal range of motion. She exhibits no edema and no tenderness.  Neurological: She is alert and oriented to person, place, and time. She has normal reflexes. She displays normal reflexes. No cranial nerve deficit. She exhibits normal muscle tone. Coordination normal.  Skin: Skin is warm and dry. No rash noted. No erythema. No pallor.  Psychiatric: She has a normal mood and affect. Her behavior is normal. Judgment and thought content normal.    Labs: Results for orders placed or performed during the hospital encounter of 04/22/24 (from the past 2 weeks)  CBC    Collection Time: 04/22/24  1:26 PM  Result Value Ref Range   WBC 8.0 4.0 - 10.5 K/uL   RBC 4.35 3.87 - 5.11 MIL/uL   Hemoglobin 13.4 12.0 - 15.0 g/dL   HCT 09.8 11.9 - 14.7 %   MCV 89.9 80.0 - 100.0 fL   MCH 30.8 26.0 - 34.0 pg   MCHC 34.3 30.0 - 36.0 g/dL   RDW 82.9 56.2 - 13.0 %   Platelets 215 150 - 400 K/uL   nRBC 0.0 0.0 - 0.2 %  Comprehensive metabolic panel   Collection Time: 04/22/24  1:26 PM  Result Value Ref Range   Sodium 138 135 - 145 mmol/L   Potassium 4.0 3.5 - 5.1 mmol/L   Chloride 106 98 - 111 mmol/L   CO2 23 22 - 32 mmol/L   Glucose, Bld 91 70 - 99 mg/dL   BUN 7 6 - 20 mg/dL   Creatinine,  Ser 0.66 0.44 - 1.00 mg/dL   Calcium 9.1 8.9 - 47.8 mg/dL   Total Protein 7.1 6.5 - 8.1 g/dL   Albumin 3.8 3.5 - 5.0 g/dL   AST 17 15 - 41 U/L   ALT 18 0 - 44 U/L   Alkaline Phosphatase 41 38 - 126 U/L   Total Bilirubin 1.4 (H) 0.0 - 1.2 mg/dL   GFR, Estimated >29 >56 mL/min   Anion gap 9 5 - 15  Rapid HIV screen (HIV 1/2 Ab+Ag)   Collection Time: 04/22/24  1:26 PM  Result Value Ref Range   HIV-1 P24 Antigen - HIV24 NON REACTIVE NON REACTIVE   HIV 1/2 Antibodies NON REACTIVE NON REACTIVE   Interpretation (HIV Ag Ab)      A non reactive test result means that HIV 1 or HIV 2 antibodies and HIV 1 p24 antigen were not detected in the specimen.  Urinalysis, Routine w reflex microscopic -Urine, Clean Catch   Collection Time: 04/22/24  1:26 PM  Result Value Ref Range   Color, Urine YELLOW YELLOW   APPearance CLEAR CLEAR   Specific Gravity, Urine 1.020 1.005 - 1.030   pH 7.0 5.0 - 8.0   Glucose, UA NEGATIVE NEGATIVE mg/dL   Hgb urine dipstick NEGATIVE NEGATIVE   Bilirubin Urine NEGATIVE NEGATIVE   Ketones, ur NEGATIVE NEGATIVE mg/dL   Protein, ur 30 (A) NEGATIVE mg/dL   Nitrite NEGATIVE NEGATIVE   Leukocytes,Ua NEGATIVE NEGATIVE   RBC / HPF 0-5 0 - 5 RBC/hpf   WBC, UA 0-5 0 - 5 WBC/hpf   Bacteria, UA NONE SEEN NONE SEEN   Squamous Epithelial / HPF 0-5 0 - 5 /HPF    Mucus PRESENT   Pregnancy, urine   Collection Time: 04/22/24  1:26 PM  Result Value Ref Range   Preg Test, Ur NEGATIVE NEGATIVE    EKG: No orders found for this or any previous visit.  Imaging Studies: No results found.    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

## 2024-04-23 NOTE — Op Note (Signed)
 Preoperative Diagnosis:  High Grade Squamous Intraepithelial lesion, adequate colposcopy  Postoperative Diagnosis:  Same as above  Procedure:  Laser ablation of the cervix  Surgeon:  Wendelyn Halter MD  Anaesthesia:  Laryngeal Mask Airway  Findings:  Patient had an abnormal pap smear which was evaluated in the office with colposcopy and directed biopsies.  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  The colposcopy was adequate.  As a result, the patient is admitted for laser ablation of the cervix.  Description of Note:  Patient was taken to the OR and placed in the supine position where she underwent laryngeal mask airway anaesthesia.  She was placed in the dorsal lithotomy position.  She was draped for laser.  Graves speculum was placed and 3% acetic acid used and the laser microscope employed to perform colposcopy which confirmed the office findings.  Laser was used on typical cervical settings and used to vaporized the squamocolumnar junction to  depth of  5-7 mm peripherally and 7-9 mm centrally.  Surgical margin of several mm was employed beyond the acetowhite epithelium.  Hemostasis was achieved with the laser and Monsel's solution.  Patient was awakened from anaesthesia in good stable condition and all counts were correct.  She received Ancef  1 gram and Toradol  30 mg IV preoperatively prophylactically.   Wendelyn Halter, MD 04/23/2024 10:17 AM

## 2024-04-23 NOTE — Anesthesia Preprocedure Evaluation (Signed)
 Anesthesia Evaluation  Patient identified by MRN, date of birth, ID band Patient awake    Reviewed: Allergy & Precautions, H&P , NPO status , Patient's Chart, lab work & pertinent test results, reviewed documented beta blocker date and time   Airway Mallampati: II  TM Distance: >3 FB Neck ROM: full    Dental no notable dental hx.    Pulmonary neg pulmonary ROS   Pulmonary exam normal breath sounds clear to auscultation       Cardiovascular Exercise Tolerance: Good hypertension, negative cardio ROS  Rhythm:regular Rate:Normal     Neuro/Psych negative neurological ROS  negative psych ROS   GI/Hepatic Neg liver ROS,GERD  ,,  Endo/Other  negative endocrine ROS    Renal/GU negative Renal ROS  negative genitourinary   Musculoskeletal   Abdominal   Peds  Hematology negative hematology ROS (+)   Anesthesia Other Findings   Reproductive/Obstetrics negative OB ROS                             Anesthesia Physical Anesthesia Plan  ASA: 2  Anesthesia Plan: General and General LMA   Post-op Pain Management:    Induction:   PONV Risk Score and Plan: Ondansetron   Airway Management Planned:   Additional Equipment:   Intra-op Plan:   Post-operative Plan:   Informed Consent: I have reviewed the patients History and Physical, chart, labs and discussed the procedure including the risks, benefits and alternatives for the proposed anesthesia with the patient or authorized representative who has indicated his/her understanding and acceptance.     Dental Advisory Given  Plan Discussed with: CRNA  Anesthesia Plan Comments:        Anesthesia Quick Evaluation

## 2024-04-23 NOTE — Discharge Instructions (Signed)

## 2024-04-24 ENCOUNTER — Encounter (HOSPITAL_COMMUNITY): Payer: Self-pay | Admitting: Obstetrics & Gynecology

## 2024-04-25 NOTE — Anesthesia Postprocedure Evaluation (Signed)
 Anesthesia Post Note  Patient: Gail Fields  Procedure(s) Performed: ABLATION, CERVIX  Patient location during evaluation: Phase II Anesthesia Type: General Level of consciousness: awake Pain management: pain level controlled Vital Signs Assessment: post-procedure vital signs reviewed and stable Respiratory status: spontaneous breathing and respiratory function stable Cardiovascular status: blood pressure returned to baseline and stable Postop Assessment: no headache and no apparent nausea or vomiting Anesthetic complications: no Comments: Late entry   No notable events documented.   Last Vitals:  Vitals:   04/23/24 1045 04/23/24 1050  BP: 109/68 102/69  Pulse: 68 67  Resp: 14 19  Temp: 36.7 C 36.7 C  SpO2: 100% 100%    Last Pain:  Vitals:   04/24/24 1512  TempSrc:   PainSc: 0-No pain                 Coretha Dew

## 2024-05-01 ENCOUNTER — Encounter: Admitting: Obstetrics & Gynecology

## 2024-05-02 ENCOUNTER — Ambulatory Visit: Admitting: Obstetrics & Gynecology

## 2024-05-02 ENCOUNTER — Encounter: Payer: Self-pay | Admitting: Obstetrics & Gynecology

## 2024-05-02 VITALS — BP 111/76 | HR 118 | Ht 66.5 in | Wt 178.0 lb

## 2024-05-02 DIAGNOSIS — Z9889 Other specified postprocedural states: Secondary | ICD-10-CM

## 2024-05-02 MED ORDER — KETOROLAC TROMETHAMINE 10 MG PO TABS: 10.0000 mg | ORAL_TABLET | Freq: Three times a day (TID) | ORAL | 0 refills | Status: AC | PRN

## 2024-05-02 NOTE — Progress Notes (Signed)
  HPI: Patient returns for routine postoperative follow-up having undergone laser ablation of the cervix on 04/23/2024.  The patient's immediate postoperative recovery has been unremarkable. Since hospital discharge the patient reports discharge.   Current Outpatient Medications: Drospirenone -Estetrol  3-14.2 MG TABS, Take 1 tablet by mouth daily., Disp: 28 tablet, Rfl: 12 fluticasone  (FLONASE ) 50 MCG/ACT nasal spray, Place 2 sprays into both nostrils daily., Disp: 16 g, Rfl: 6 ipratropium (ATROVENT ) 0.03 % nasal spray, Place 2 sprays into both nostrils every 12 (twelve) hours., Disp: 30 mL, Rfl: 0 ketoconazole  (NIZORAL ) 2 % cream, Apply 1 application. topically daily., Disp: 15 g, Rfl: 0 omeprazole  (PRILOSEC) 20 MG capsule, 1 PO 30 MINS PRIOR TO BREAKFAST., Disp: 90 capsule, Rfl: 3 ondansetron  (ZOFRAN -ODT) 8 MG disintegrating tablet, Take 1 tablet (8 mg total) by mouth every 8 (eight) hours as needed for nausea or vomiting., Disp: 8 tablet, Rfl: 0 polyethylene glycol (MIRALAX / GLYCOLAX) 17 g packet, Take 17 g by mouth daily., Disp: , Rfl:  HYDROcodone -acetaminophen  (NORCO/VICODIN) 5-325 MG tablet, Take 1 tablet by mouth every 6 (six) hours as needed. (Patient not taking: Reported on 05/02/2024), Disp: 10 tablet, Rfl: 0 ketorolac  (TORADOL ) 10 MG tablet, Take 1 tablet (10 mg total) by mouth every 8 (eight) hours as needed., Disp: 15 tablet, Rfl: 0  No current facility-administered medications for this visit. Facility-Administered Medications Ordered in Other Visits: lidocaine  2 % w/epi 1:200,000 (20 ml) with sod bicarb 8.4 % (2 ml) inj, , , Continuous PRN, Gregorio, Debra R, CRNA, 5 mL at 08/18/15 2327     Blood pressure 111/76, pulse (!) 118, height 5' 6.5 (1.689 m), weight 178 lb (80.7 kg).  Physical Exam: Cervical bed is healing well No bleeding Escar present  Diagnostic Tests:   Pathology: na  Impression + Management plan:   ICD-10-CM   1. Post-operative state: Laser ablation  of the cervix for HSIL  Z98.890      Return in 6 months for 1st surveillance cytology (no HPV testing for the 2 years)     Medications Prescribed this encounter: No orders of the defined types were placed in this encounter.     Follow up: No follow-ups on file.    Gail VEAR Inch, MD Attending Physician for the Center for [facility] and [facility] 05/02/2024 10:01 AM

## 2024-05-02 NOTE — Addendum Note (Signed)
 Addended by: JAYNE VONN DEL on: 05/02/2024 11:40 AM   Modules accepted: Orders

## 2024-05-16 ENCOUNTER — Encounter (HOSPITAL_COMMUNITY): Payer: Self-pay | Admitting: Obstetrics & Gynecology

## 2024-11-20 ENCOUNTER — Ambulatory Visit: Admitting: Obstetrics & Gynecology

## 2024-11-20 ENCOUNTER — Other Ambulatory Visit (HOSPITAL_COMMUNITY)
Admission: RE | Admit: 2024-11-20 | Discharge: 2024-11-20 | Disposition: A | Source: Ambulatory Visit | Attending: Obstetrics & Gynecology | Admitting: Obstetrics & Gynecology

## 2024-11-20 ENCOUNTER — Encounter: Payer: Self-pay | Admitting: Obstetrics & Gynecology

## 2024-11-20 VITALS — BP 117/70 | HR 68 | Ht 66.0 in | Wt 185.8 lb

## 2024-11-20 DIAGNOSIS — Z124 Encounter for screening for malignant neoplasm of cervix: Secondary | ICD-10-CM | POA: Diagnosis not present

## 2024-11-20 DIAGNOSIS — Z87410 Personal history of cervical dysplasia: Secondary | ICD-10-CM | POA: Insufficient documentation

## 2024-11-20 NOTE — Progress Notes (Signed)
 Follow up appointment  1st post ablation surveillance Cytology (no HPV)  Chief Complaint  Patient presents with   Follow-up    6 month pap    Blood pressure 117/70, pulse 68, height 5' 6 (1.676 m), weight 185 lb 12.8 oz (84.3 kg).  No complaints Doing well  1st cytology done today, HPV waived, q 6 months x 2 years  MEDS ordered this encounter: No orders of the defined types were placed in this encounter.   Orders for this encounter: No orders of the defined types were placed in this encounter.   Impression + Management Plan   ICD-10-CM   1. History of cervical dysplasia, high grade S/P laser ablation of the cervix 04/23/2024  Z87.410    1st surveillance cytology today    2. Routine Papanicolaou smear  Z12.4 CANCELED: Cytology - PAP( [place])      Follow Up: Return in about 6 months (around 05/20/2025) for Follow up cytology only.     All questions were answered.  Past Medical History:  Diagnosis Date   Acute cystitis    BV (bacterial vaginosis)    GERD (gastroesophageal reflux disease)    Hemorrhoid    Hx of chlamydia infection    Patellofemoral stress syndrome    Polyp at cervical os    Trichimoniasis    Vaginal Pap smear, abnormal 09/22/2014   ASCUS- +HPV   Yeast infection     Past Surgical History:  Procedure Laterality Date   CERVICAL ABLATION N/A 04/23/2024   Procedure: ABLATION, CERVIX;  Surgeon: Jayne Vonn DEL, MD;  Location: [facility];  Service: Gynecology;  Laterality: N/A;   COLONOSCOPY N/A 01/02/2020   Procedure: COLONOSCOPY;  Surgeon: Harvey Margo CROME, MD;  Location: [facility];  Service: Endoscopy;  Laterality: N/A;  11:00am   HEMORRHOID SURGERY N/A 04/24/2018   Procedure: HEMORRHOIDECTOMY;  Surgeon: Sheldon Standing, MD;  Location: [facility];  Service: General;  Laterality: N/A;   PEXY N/A 04/24/2018   Procedure: HEMORRHOIDAL LIGATION/PEXY;  Surgeon: Sheldon Standing, MD;  Location: [facility];  Service: General;  Laterality: N/A;   RECTAL EXAM UNDER  ANESTHESIA N/A 04/24/2018   Procedure: ANORECTAL EXAM UNDER ANESTHESIA;  Surgeon: Sheldon Standing, MD;  Location: [facility];  Service: General;  Laterality: N/A;    OB History     Gravida  4   Para  2   Term  2   Preterm      AB  2   Living  2      SAB  1   IAB  1   Ectopic      Multiple  0   Live Births  2           Allergies[1]  Social History   Socioeconomic History   Marital status: Single    Spouse name: Not on file   Number of children: 2   Years of education: Not on file   Highest education level: Not on file  Occupational History   Not on file  Tobacco Use   Smoking status: Never   Smokeless tobacco: Never  Vaping Use   Vaping status: Never Used  Substance and Sexual Activity   Alcohol use: No   Drug use: No   Sexual activity: Not Currently    Birth control/protection: Pill  Other Topics Concern   Not on file  Social History Narrative   Not on file   Social Drivers of Health   Tobacco Use: Low Risk (11/20/2024)  Patient History    Smoking Tobacco Use: Never    Smokeless Tobacco Use: Never    Passive Exposure: Not on file  Financial Resource Strain: Low Risk (08/27/2023)   Overall Financial Resource Strain (CARDIA)    Difficulty of Paying Living Expenses: Not hard at all  Food Insecurity: No Food Insecurity (08/27/2023)   Hunger Vital Sign    Worried About Running Out of Food in the Last Year: Never true    Ran Out of Food in the Last Year: Never true  Transportation Needs: No Transportation Needs (08/27/2023)   PRAPARE - Administrator, Civil Service (Medical): No    Lack of Transportation (Non-Medical): No  Physical Activity: Insufficiently Active (08/27/2023)   Exercise Vital Sign    Days of Exercise per Week: 5 days    Minutes of Exercise per Session: 20 min  Stress: No Stress Concern Present (08/27/2023)   [employer] of Occupational Health - Occupational Stress Questionnaire    Feeling of Stress : Not at all   Social Connections: Moderately Integrated (08/27/2023)   Social Connection and Isolation Panel    Frequency of Communication with Friends and Family: More than three times a week    Frequency of Social Gatherings with Friends and Family: Once a week    Attends Religious Services: More than 4 times per year    Active Member of Clubs or Organizations: Yes    Attends Banker Meetings: More than 4 times per year    Marital Status: Never married  Depression (PHQ2-9): Low Risk (08/27/2023)   Depression (PHQ2-9)    PHQ-2 Score: 0  Alcohol Screen: Low Risk (08/27/2023)   Alcohol Screen    Last Alcohol Screening Score (AUDIT): 1  Housing: Low Risk (08/27/2023)   Housing    Last Housing Risk Score: 0  Utilities: Not At Risk (08/27/2023)   AHC Utilities    Threatened with loss of utilities: No  Health Literacy: Not on file    Family History  Problem Relation Age of Onset   Cancer Maternal Grandmother        breast   Seizures Paternal Grandmother    Colon cancer Neg Hx        [1] No Known Allergies

## 2024-11-24 ENCOUNTER — Ambulatory Visit: Payer: Self-pay | Admitting: Obstetrics & Gynecology

## 2024-11-24 LAB — CYTOLOGY - PAP
Comment: NEGATIVE
Diagnosis: UNDETERMINED — AB
High risk HPV: NEGATIVE
